# Patient Record
Sex: Female | Born: 1935
Health system: Southern US, Community
[De-identification: ages and names within clinical notes are randomized; demographics above are authoritative.]

## PROBLEM LIST (undated history)

## (undated) DIAGNOSIS — E669 Obesity, unspecified: Secondary | ICD-10-CM

## (undated) DIAGNOSIS — Z923 Personal history of irradiation: Secondary | ICD-10-CM

## (undated) DIAGNOSIS — C52 Malignant neoplasm of vagina: Secondary | ICD-10-CM

## (undated) DIAGNOSIS — I10 Essential (primary) hypertension: Secondary | ICD-10-CM

## (undated) DIAGNOSIS — D692 Other nonthrombocytopenic purpura: Secondary | ICD-10-CM

## (undated) DIAGNOSIS — S2220XA Unspecified fracture of sternum, initial encounter for closed fracture: Secondary | ICD-10-CM

## (undated) DIAGNOSIS — M1990 Unspecified osteoarthritis, unspecified site: Secondary | ICD-10-CM

## (undated) DIAGNOSIS — D126 Benign neoplasm of colon, unspecified: Secondary | ICD-10-CM

## (undated) HISTORY — DX: Unspecified osteoarthritis, unspecified site: M19.90

## (undated) HISTORY — DX: Other nonthrombocytopenic purpura: D69.2

## (undated) HISTORY — DX: Essential (primary) hypertension: I10

## (undated) HISTORY — PX: CATARACT EXTRACTION, BILATERAL: SHX1313

## (undated) HISTORY — PX: ABDOMINAL HYSTERECTOMY: SHX81

## (undated) HISTORY — DX: Obesity, unspecified: E66.9

## (undated) HISTORY — DX: Benign neoplasm of colon, unspecified: D12.6

---
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
## 1973-05-29 HISTORY — PX: TOTAL ABDOMINAL HYSTERECTOMY W/ BILATERAL SALPINGOOPHORECTOMY: SHX83

## 2010-05-29 DIAGNOSIS — D126 Benign neoplasm of colon, unspecified: Secondary | ICD-10-CM

## 2010-05-29 HISTORY — PX: COLONOSCOPY W/ BIOPSIES: SHX1374

## 2010-05-29 HISTORY — DX: Benign neoplasm of colon, unspecified: D12.6

## 2014-10-29 DIAGNOSIS — R739 Hyperglycemia, unspecified: Secondary | ICD-10-CM | POA: Diagnosis not present

## 2014-10-29 DIAGNOSIS — D72819 Decreased white blood cell count, unspecified: Secondary | ICD-10-CM | POA: Diagnosis not present

## 2014-10-29 DIAGNOSIS — I10 Essential (primary) hypertension: Secondary | ICD-10-CM | POA: Diagnosis not present

## 2014-11-11 DIAGNOSIS — H524 Presbyopia: Secondary | ICD-10-CM | POA: Diagnosis not present

## 2014-11-11 DIAGNOSIS — Z961 Presence of intraocular lens: Secondary | ICD-10-CM | POA: Diagnosis not present

## 2014-11-11 DIAGNOSIS — H04121 Dry eye syndrome of right lacrimal gland: Secondary | ICD-10-CM | POA: Diagnosis not present

## 2014-11-11 DIAGNOSIS — H04122 Dry eye syndrome of left lacrimal gland: Secondary | ICD-10-CM | POA: Diagnosis not present

## 2014-11-11 DIAGNOSIS — H04123 Dry eye syndrome of bilateral lacrimal glands: Secondary | ICD-10-CM | POA: Diagnosis not present

## 2015-03-16 DIAGNOSIS — Z23 Encounter for immunization: Secondary | ICD-10-CM | POA: Diagnosis not present

## 2015-04-06 DIAGNOSIS — M25561 Pain in right knee: Secondary | ICD-10-CM | POA: Diagnosis not present

## 2015-04-06 DIAGNOSIS — M25551 Pain in right hip: Secondary | ICD-10-CM | POA: Diagnosis not present

## 2015-04-27 DIAGNOSIS — M1711 Unilateral primary osteoarthritis, right knee: Secondary | ICD-10-CM | POA: Diagnosis not present

## 2015-04-30 DIAGNOSIS — M179 Osteoarthritis of knee, unspecified: Secondary | ICD-10-CM | POA: Diagnosis not present

## 2015-04-30 DIAGNOSIS — D126 Benign neoplasm of colon, unspecified: Secondary | ICD-10-CM | POA: Diagnosis not present

## 2015-04-30 DIAGNOSIS — Z6837 Body mass index (BMI) 37.0-37.9, adult: Secondary | ICD-10-CM | POA: Diagnosis not present

## 2015-04-30 DIAGNOSIS — Z1389 Encounter for screening for other disorder: Secondary | ICD-10-CM | POA: Diagnosis not present

## 2015-04-30 DIAGNOSIS — I10 Essential (primary) hypertension: Secondary | ICD-10-CM | POA: Diagnosis not present

## 2015-05-04 DIAGNOSIS — M1711 Unilateral primary osteoarthritis, right knee: Secondary | ICD-10-CM | POA: Diagnosis not present

## 2015-05-11 DIAGNOSIS — M1711 Unilateral primary osteoarthritis, right knee: Secondary | ICD-10-CM | POA: Diagnosis not present

## 2015-05-18 DIAGNOSIS — M1711 Unilateral primary osteoarthritis, right knee: Secondary | ICD-10-CM | POA: Diagnosis not present

## 2015-05-25 DIAGNOSIS — M1711 Unilateral primary osteoarthritis, right knee: Secondary | ICD-10-CM | POA: Diagnosis not present

## 2015-12-07 DIAGNOSIS — I10 Essential (primary) hypertension: Secondary | ICD-10-CM | POA: Diagnosis not present

## 2015-12-07 DIAGNOSIS — R8299 Other abnormal findings in urine: Secondary | ICD-10-CM | POA: Diagnosis not present

## 2015-12-07 DIAGNOSIS — N390 Urinary tract infection, site not specified: Secondary | ICD-10-CM | POA: Diagnosis not present

## 2015-12-14 DIAGNOSIS — D692 Other nonthrombocytopenic purpura: Secondary | ICD-10-CM | POA: Diagnosis not present

## 2015-12-14 DIAGNOSIS — I10 Essential (primary) hypertension: Secondary | ICD-10-CM | POA: Diagnosis not present

## 2015-12-14 DIAGNOSIS — D126 Benign neoplasm of colon, unspecified: Secondary | ICD-10-CM | POA: Diagnosis not present

## 2015-12-14 DIAGNOSIS — M179 Osteoarthritis of knee, unspecified: Secondary | ICD-10-CM | POA: Diagnosis not present

## 2015-12-14 DIAGNOSIS — Z0000 Encounter for general adult medical examination without abnormal findings: Secondary | ICD-10-CM | POA: Diagnosis not present

## 2015-12-14 DIAGNOSIS — Z1389 Encounter for screening for other disorder: Secondary | ICD-10-CM | POA: Diagnosis not present

## 2015-12-15 ENCOUNTER — Encounter: Payer: Self-pay | Admitting: Gastroenterology

## 2016-01-05 ENCOUNTER — Ambulatory Visit (INDEPENDENT_AMBULATORY_CARE_PROVIDER_SITE_OTHER): Payer: Medicare Other | Admitting: Gastroenterology

## 2016-01-05 ENCOUNTER — Encounter: Payer: Self-pay | Admitting: Gastroenterology

## 2016-01-05 VITALS — BP 138/86 | HR 80 | Ht 70.5 in | Wt 245.6 lb

## 2016-01-05 DIAGNOSIS — Z860100 Personal history of colon polyps, unspecified: Secondary | ICD-10-CM

## 2016-01-05 NOTE — Patient Instructions (Signed)
We will contact you once we receive the colonoscopy report from your previous GI doctor and let you know if you will need to schedule a colonoscopy.

## 2016-01-05 NOTE — Progress Notes (Signed)
HPI :  82 y/o very pleasant female here for a new patient evaluation. She has a history of obesity, HTN, and colon polyps. She is here to discuss a surveillance colonoscopy.   She has some chronic knee pains for which she sees PT but otherwise states she is very active   She reports a prior surgery to remove a large polyp in her colon about 10 years ago, she had colon resection but states she is unaware of the details of this. Her last colonoscopy was in 2012 with a reported adenoma removed although we don't have those records available today.   She denies any blood in the stools. She has some rare incontinence if she has a strong urge, but no routine diarrhea or constipation. She has roughly 3 BMs per day or so. She tries to eat very healthy. She has lost 30 lbs since changing her diet over the past year when she moved from [place] to [place]. No abdominal pains. She denies a history of anemia.    Labs: 12/07/15 Hgb 13.1, WBC 4.3, Plt 195 LAEs normal   Past Medical History:  Diagnosis Date  . Adenomatous colon polyp 2012  . Arthritis   . Hypertension   . Obesity   . Osteoarthritis   . Senile purpura ([place])      Past Surgical History:  Procedure Laterality Date  . COLONOSCOPY W/ BIOPSIES  2012  . TOTAL ABDOMINAL HYSTERECTOMY W/ BILATERAL SALPINGOOPHORECTOMY  1975   Family History  Problem Relation Age of Onset  . Gout Father   . Diabetes Sister   . Breast cancer Maternal Aunt   . Prostate cancer Son    Social History  Substance Use Topics  . Smoking status: Never Smoker  . Smokeless tobacco: Never Used  . Alcohol use No   Current Outpatient Prescriptions  Medication Sig Dispense Refill  . Calcium Carbonate (CALCIUM 600 PO) Take 1 tablet by mouth daily.    . Cholecalciferol (VITAMIN D3) 3000 units TABS Take 1 tablet by mouth daily.    . Cranberry-Vitamin C (AZO CRANBERRY URINARY TRACT) 250-60 MG CAPS Take 1 capsule by mouth 2 (two) times daily.    .  hydrochlorothiazide (HYDRODIURIL) 25 MG tablet Take 25 mg by mouth daily.    Marland Kitchen ibuprofen (ADVIL,MOTRIN) 800 MG tablet Take 800 mg by mouth daily as needed.    . Multiple Vitamins-Minerals (MULTIPLE VITAMINS/WOMENS PO) Take 1 capsule by mouth daily.    . Omega-3 Fatty Acids (FISH OIL) 1000 MG CAPS Take 2,000 capsules by mouth daily.     No current facility-administered medications for this visit.    No Known Allergies   Review of Systems: All systems reviewed and negative except where noted in HPI.    No results found.  Physical Exam: BP 138/86 (BP Location: Left Arm, Patient Position: Sitting, Cuff Size: Large)   Pulse 80   Ht 5' 10.5" (1.791 m)   Wt 245 lb 9.6 oz (111.4 kg)   BMI 34.74 kg/m  Constitutional: Pleasant,well-developed, female in no acute distress. HEENT: Normocephalic and atraumatic. Conjunctivae are normal. No scleral icterus. Neck supple.  Cardiovascular: Normal rate, regular rhythm.  Pulmonary/chest: Effort normal and breath sounds normal. No wheezing, rales or rhonchi. Abdominal: Soft, nondistended, nontender. Bowel sounds active throughout. There are no masses palpable.  Extremities: no edema Lymphadenopathy: No cervical adenopathy noted. Neurological: Alert and oriented to person place and time. Skin: Skin is warm and dry. No rashes noted. Psychiatric: Normal mood and affect. Behavior  is normal.   ASSESSMENT AND PLAN: 82 y/o female, quite healthy, with a reported history of colon resection for advanced colon polyp, with last surveillance colonoscopy 5 years ago, here to discuss surveillance colonoscopy. I don't have the report of her last colonoscopy so we will obtain this to clarify the findings. I discussed risks / benefits of further colon cancer screening with her at this time. Given her age, I don't think she would warrant a repeat surveillance exam at this time unless she had a high risk lesion noted on her last exam. She is healthy and quite active for  her age, and discussed this issue at length with her. I will await her colonoscopy report and get back to her once we have this. If there is no high risk lesion, I think it would be prudent to stop surveillance exams unless she strongly wishes to pursue it. She wishes to think about it for now.   Gu Oidak Cellar, MD [facility] Pager [phone]

## 2016-01-20 ENCOUNTER — Telehealth: Payer: Self-pay | Admitting: Gastroenterology

## 2016-01-20 NOTE — Telephone Encounter (Signed)
Records from prior colonoscopies obtained and outlined below: Done at [facility], Dr. Lauris Chroman  Colonoscopy 08/31/2010 - 2 x 26mm flat sigmoid hyperplastic polyps removed, sigmoid diverticulosis, hemorrhoids  Colonoscopy 08/28/2007 - 28mm IC valve polyp - adenoma, 1.1cm transverse polyp - hyperplastic Colonoscopy 08/25/2004 - diminutive sigmoid polyp, 26mm rectal polyp - both hyperplastic Colonoscopy 07/21/2003 - diminutive ascending polyp - normal mucosa on path, diminutive sigmoid polyp - hyperplastic  Overall, while the patient had a large polyp removed with surgical resection several years ago, she has had 4 colonoscopies since 2005 with only one small adenoma, the rest hyperplastic / benign polyps. Her last colonoscopy in 2012 showed 2 sigmoid hyperplastic polyps and no adenomas. Given her age and these results, I don't think any further surveillance colonoscopies are recommended at this time.   Can you please relay this to her? Thank you

## 2016-01-20 NOTE — Telephone Encounter (Signed)
Patient is advised.  

## 2016-02-10 DIAGNOSIS — M179 Osteoarthritis of knee, unspecified: Secondary | ICD-10-CM | POA: Diagnosis not present

## 2016-02-10 DIAGNOSIS — Z6836 Body mass index (BMI) 36.0-36.9, adult: Secondary | ICD-10-CM | POA: Diagnosis not present

## 2016-02-10 DIAGNOSIS — Z23 Encounter for immunization: Secondary | ICD-10-CM | POA: Diagnosis not present

## 2016-04-27 DIAGNOSIS — M545 Low back pain: Secondary | ICD-10-CM | POA: Diagnosis not present

## 2016-04-27 DIAGNOSIS — M1711 Unilateral primary osteoarthritis, right knee: Secondary | ICD-10-CM | POA: Diagnosis not present

## 2016-05-01 ENCOUNTER — Encounter (INDEPENDENT_AMBULATORY_CARE_PROVIDER_SITE_OTHER): Payer: Self-pay

## 2016-05-01 ENCOUNTER — Ambulatory Visit (INDEPENDENT_AMBULATORY_CARE_PROVIDER_SITE_OTHER): Payer: Medicare Other | Admitting: Cardiology

## 2016-05-01 ENCOUNTER — Encounter: Payer: Self-pay | Admitting: Cardiology

## 2016-05-01 VITALS — BP 182/94 | HR 79 | Ht 70.0 in | Wt 248.8 lb

## 2016-05-01 DIAGNOSIS — M179 Osteoarthritis of knee, unspecified: Secondary | ICD-10-CM | POA: Insufficient documentation

## 2016-05-01 DIAGNOSIS — Z01818 Encounter for other preprocedural examination: Secondary | ICD-10-CM | POA: Diagnosis not present

## 2016-05-01 DIAGNOSIS — M1711 Unilateral primary osteoarthritis, right knee: Secondary | ICD-10-CM

## 2016-05-01 DIAGNOSIS — I10 Essential (primary) hypertension: Secondary | ICD-10-CM | POA: Diagnosis not present

## 2016-05-01 DIAGNOSIS — M1710 Unilateral primary osteoarthritis, unspecified knee: Secondary | ICD-10-CM | POA: Insufficient documentation

## 2016-05-01 HISTORY — DX: Essential (primary) hypertension: I10

## 2016-05-01 NOTE — Progress Notes (Signed)
Cardiology Office Note    Date:  05/01/2016   ID:  Dublin Highsmith, DOB [birth year], MRN [id number]  PCP:  Haywood Pao, MD  Cardiologist:  Fransico Him, MD   Chief Complaint  Patient presents with  . New Evaluation    preoperative cardiac clearance    History of Present Illness:  Mia Simpson is a 82 y.o. female with a history of HTN presents today for evaluation for preoperative cardiac clearance prior to knee replacement surgery.  She has no history of heart disease.  She denies any chest pain or pressure.  She denies any SOB, DOE,  dizziness, palpitations or syncope.  She occasionally has some mild LE edema controlled with diuretics.  Her right knee swells occasionally.  She currently walks with a cane for the past month.  She has been very active until the last month and could walk at least 4 blocks.  She walks on the step machine for 1.5 miles twice weekly.      Past Medical History:  Diagnosis Date  . Adenomatous colon polyp 2012  . Arthritis   . Benign essential HTN 05/01/2016  . Hypertension   . Obesity   . Osteoarthritis   . Senile purpura ([place])     Past Surgical History:  Procedure Laterality Date  . COLONOSCOPY W/ BIOPSIES  2012  . TOTAL ABDOMINAL HYSTERECTOMY W/ BILATERAL SALPINGOOPHORECTOMY  1975    Current Medications: Outpatient Medications Prior to Visit  Medication Sig Dispense Refill  . Calcium Carbonate (CALCIUM 600 PO) Take 1 tablet by mouth daily.    . Cholecalciferol (VITAMIN D3) 3000 units TABS Take 1 tablet by mouth daily.    . Cranberry-Vitamin C (AZO CRANBERRY URINARY TRACT) 250-60 MG CAPS Take 1 capsule by mouth 2 (two) times daily.    . hydrochlorothiazide (HYDRODIURIL) 25 MG tablet Take 25 mg by mouth daily.    Marland Kitchen ibuprofen (ADVIL,MOTRIN) 800 MG tablet Take 800 mg by mouth daily as needed.    . Multiple Vitamins-Minerals (MULTIPLE VITAMINS/WOMENS PO) Take 1 capsule by mouth daily.    . Omega-3 Fatty Acids (FISH OIL) 1000 MG CAPS Take 2,000  capsules by mouth daily.     No facility-administered medications prior to visit.      Allergies:   Patient has no known allergies.   Social History   Social History  . Marital status: Single    Spouse name: N/A  . Number of children: 2  . Years of education: N/A   Occupational History  . retired    Social History Main Topics  . Smoking status: Never Smoker  . Smokeless tobacco: Never Used  . Alcohol use No  . Drug use: No  . Sexual activity: Not Asked   Other Topics Concern  . None   Social History Narrative  . None     Family History:  The patient's family history includes Breast cancer in her maternal aunt; Diabetes in her sister; Gout in her father; Prostate cancer in her son.   ROS:   Please see the history of present illness.    ROS All other systems reviewed and are negative.  No flowsheet data found.     PHYSICAL EXAM:   VS:  BP (!) 182/94   Pulse 79   Ht 5\' 10"  (1.778 m)   Wt 248 lb 12.8 oz (112.9 kg)   BMI 35.70 kg/m    GEN: Well nourished, well developed, in no acute distress  HEENT: normal  Neck: no JVD,  carotid bruits, or masses Cardiac: RRR; no murmurs, rubs, or gallops,no edema.  Intact distal pulses bilaterally.  Respiratory:  clear to auscultation bilaterally, normal work of breathing GI: soft, nontender, nondistended, + BS MS: no deformity or atrophy  Skin: warm and dry, no rash Neuro:  Alert and Oriented x 3, Strength and sensation are intact Psych: euthymic mood, full affect  Wt Readings from Last 3 Encounters:  05/01/16 248 lb 12.8 oz (112.9 kg)  01/05/16 245 lb 9.6 oz (111.4 kg)      Studies/Labs Reviewed:   EKG:  EKG is ordered today.  The ekg ordered today demonstrates NSR at 79bpm with PAC and aberrantly conducted QRS  Recent Labs: No results found for requested labs within last 8760 hours.   Lipid Panel No results found for: CHOL, TRIG, HDL, CHOLHDL, VLDL, LDLCALC, LDLDIRECT  Additional studies/ records that were  reviewed today include:  none    ASSESSMENT:    1. Preoperative clearance   2. Benign essential HTN   3. Primary osteoarthritis of right knee      PLAN:  In order of problems listed above:  1. HTN - BP elevated today but usually her BP runs 120-130/47mmHg.  She has been on a steroid taper for the past 8 days which I suspect is playing a role in her elevated BP.  Continue diuretic.   2. Preoperative cardiac clearance.  She has no history of CAD and EKG is nonischemic.  Her only risk factors for CAD are HTN, obesity and post menopausal state.  Prior to about a month ago she was active walking at least 4 blocks which is equivalent to 4 mets. She is still doing a walking class 2 times weekly for 1.5 miles each day and has no cardiac complaints.  I think she is low risk from a cardiac standpoint and should proceed with knee surgery.   3. DJD of the right knee    Medication Adjustments/Labs and Tests Ordered: Current medicines are reviewed at length with the patient today.  Concerns regarding medicines are outlined above.  Medication changes, Labs and Tests ordered today are listed in the Patient Instructions below.  There are no Patient Instructions on file for this visit.   Signed, Fransico Him, MD  05/01/2016 8:34 AM    [address], [place], [place]  [address] Phone: [phone]; Fax: [phone]

## 2016-05-01 NOTE — Patient Instructions (Signed)
Medication Instructions:  Your physician recommends that you continue on your current medications as directed. Please refer to the Current Medication list given to you today.   Labwork: None  Testing/Procedures: None  Follow-Up: Your physician recommends that you schedule a follow-up appointment AS NEEDED with Dr. Turner.  Any Other Special Instructions Will Be Listed Below (If Applicable).     If you need a refill on your cardiac medications before your next appointment, please call your pharmacy.   

## 2016-05-02 NOTE — Addendum Note (Signed)
Addended by: Zebedee Iba on: 05/02/2016 08:21 AM   Modules accepted: Orders

## 2016-05-04 DIAGNOSIS — M545 Low back pain: Secondary | ICD-10-CM | POA: Diagnosis not present

## 2016-05-23 ENCOUNTER — Ambulatory Visit: Payer: Self-pay | Admitting: Physician Assistant

## 2016-05-23 DIAGNOSIS — M25561 Pain in right knee: Secondary | ICD-10-CM | POA: Diagnosis not present

## 2016-05-23 NOTE — H&P (Signed)
TOTAL KNEE ADMISSION H&P  Patient is being admitted for right total knee arthroplasty.  Subjective:  Chief Complaint:right knee pain.  HPI: Mia Simpson, 82 y.o. female, has a history of pain and functional disability in the right knee due to arthritis and has failed non-surgical conservative treatments for greater than 12 weeks to includeNSAID's and/or analgesics, corticosteriod injections, viscosupplementation injections, use of assistive devices and activity modification.  Onset of symptoms was gradual, starting >10 years ago with gradually worsening course since that time. The patient noted no past surgery on the right knee(s).  Patient currently rates pain in the right knee(s) at 10 out of 10 with activity. Patient has night pain, worsening of pain with activity and weight bearing, pain that interferes with activities of daily living, pain with passive range of motion, crepitus and joint swelling.  Patient has evidence of periarticular osteophytes and joint space narrowing by imaging studies.  There is no active infection.  Patient Active Problem List   Diagnosis Date Noted  . Benign essential HTN 05/01/2016  . Preoperative clearance 05/01/2016  . DJD (degenerative joint disease) of knee 05/01/2016   Past Medical History:  Diagnosis Date  . Adenomatous colon polyp 2012  . Arthritis   . Benign essential HTN 05/01/2016  . Hypertension   . Obesity   . Osteoarthritis   . Senile purpura ([place])     Past Surgical History:  Procedure Laterality Date  . COLONOSCOPY W/ BIOPSIES  2012  . TOTAL ABDOMINAL HYSTERECTOMY W/ BILATERAL SALPINGOOPHORECTOMY  1975     (Not in a hospital admission) No Known Allergies  Social History  Substance Use Topics  . Smoking status: Never Smoker  . Smokeless tobacco: Never Used  . Alcohol use No    Family History  Problem Relation Age of Onset  . Gout Father   . Diabetes Sister   . Breast cancer Maternal Aunt   . Prostate cancer Son       ROS  Objective:  Physical Exam  Vital signs in last 24 hours: @VSRANGES @  Labs:   Estimated body mass index is 35.7 kg/m as calculated from the following:   Height as of 05/01/16: 5\' 10"  (1.778 m).   Weight as of 05/01/16: 112.9 kg (248 lb 12.8 oz).   Imaging Review Plain radiographs demonstrate severe degenerative joint disease of the right knee(s). The overall alignment ismild varus. The bone quality appears to be good for age and reported activity level.  Assessment/Plan:  End stage arthritis, right knee   The patient history, physical examination, clinical judgment of the provider and imaging studies are consistent with end stage degenerative joint disease of the right knee(s) and total knee arthroplasty is deemed medically necessary. The treatment options including medical management, injection therapy arthroscopy and arthroplasty were discussed at length. The risks and benefits of total knee arthroplasty were presented and reviewed. The risks due to aseptic loosening, infection, stiffness, patella tracking problems, thromboembolic complications and other imponderables were discussed. The patient acknowledged the explanation, agreed to proceed with the plan and consent was signed. Patient is being admitted for inpatient treatment for surgery, pain control, PT, OT, prophylactic antibiotics, VTE prophylaxis, progressive ambulation and ADL's and discharge planning. The patient is planning to be discharged to skilled nursing facility [facility]

## 2016-05-24 DIAGNOSIS — M48061 Spinal stenosis, lumbar region without neurogenic claudication: Secondary | ICD-10-CM | POA: Diagnosis not present

## 2016-05-24 DIAGNOSIS — M5136 Other intervertebral disc degeneration, lumbar region: Secondary | ICD-10-CM | POA: Diagnosis not present

## 2016-05-24 DIAGNOSIS — M5116 Intervertebral disc disorders with radiculopathy, lumbar region: Secondary | ICD-10-CM | POA: Diagnosis not present

## 2016-05-24 DIAGNOSIS — M545 Low back pain: Secondary | ICD-10-CM | POA: Diagnosis not present

## 2016-06-02 NOTE — Pre-Procedure Instructions (Signed)
Mia Simpson  06/02/2016      [id number] [address], [place] [phone] [facility] [place] [address] [place] [address] Phone: [phone] Fax: [phone]    Your procedure is scheduled on January 19  Report to [facility] at [place].M.  Call this number if you have problems the morning of surgery:  [phone]   Remember:  Do not eat food or drink liquids after midnight.   Take these medicines the morning of surgery with A SIP OF WATER NONE  7 days prior to surgery STOP taking any Aspirin, Aleve, Naproxen, Ibuprofen, Motrin, Advil, Goody's, BC's, all herbal medications, fish oil, and all vitamins    Do not wear jewelry, make-up or nail polish.  Do not wear lotions, powders, or perfumes, or deoderant.  Do not shave 48 hours prior to surgery.    Do not bring valuables to the hospital.  [facility] is not responsible for any belongings or valuables.  Contacts, dentures or bridgework may not be worn into surgery.  Leave your suitcase in the car.  After surgery it may be brought to your room.  For patients admitted to the hospital, discharge time will be determined by your treatment team.  Patients discharged the day of surgery will not be allowed to drive home.    Special instructions:   [facility]- Preparing For Surgery  Before surgery, you can play an important role. Because skin is not sterile, your skin needs to be as free of germs as possible. You can reduce the number of germs on your skin by washing with CHG (chlorahexidine gluconate) Soap before surgery.  CHG is an antiseptic cleaner which kills germs and bonds with the skin to continue killing germs even after washing.  Please do not use if you have an allergy to CHG or antibacterial soaps. If your skin becomes reddened/irritated stop using the CHG.  Do not shave (including legs and underarms) for at least 48 hours prior to first CHG shower. It is OK to  shave your face.  Please follow these instructions carefully.   1. Shower the NIGHT BEFORE SURGERY and the MORNING OF SURGERY with CHG.   2. If you chose to wash your hair, wash your hair first as usual with your normal shampoo.  3. After you shampoo, rinse your hair and body thoroughly to remove the shampoo.  4. Use CHG as you would any other liquid soap. You can apply CHG directly to the skin and wash gently with a scrungie or a clean washcloth.   5. Apply the CHG Soap to your body ONLY FROM THE NECK DOWN.  Do not use on open wounds or open sores. Avoid contact with your eyes, ears, mouth and genitals (private parts). Wash genitals (private parts) with your normal soap.  6. Wash thoroughly, paying special attention to the area where your surgery will be performed.  7. Thoroughly rinse your body with warm water from the neck down.  8. DO NOT shower/wash with your normal soap after using and rinsing off the CHG Soap.  9. Pat yourself dry with a CLEAN TOWEL.   10. Wear CLEAN PAJAMAS   11. Place CLEAN SHEETS on your bed the night of your first shower and DO NOT SLEEP WITH PETS.    Day of Surgery: Do not apply any deodorants/lotions. Please wear clean clothes to the hospital/surgery center.      Please read over the  following fact sheets that you were given.

## 2016-06-05 ENCOUNTER — Encounter (HOSPITAL_COMMUNITY): Payer: Self-pay

## 2016-06-05 ENCOUNTER — Encounter (HOSPITAL_COMMUNITY)
Admission: RE | Admit: 2016-06-05 | Discharge: 2016-06-05 | Disposition: A | Discharge status: Home / Self Care | Payer: Medicare Other | Source: Ambulatory Visit | Attending: Orthopedic Surgery | Admitting: Orthopedic Surgery

## 2016-06-05 ENCOUNTER — Encounter (HOSPITAL_COMMUNITY)
Admission: RE | Admit: 2016-06-05 | Discharge: 2016-06-05 | Disposition: A | Discharge status: Home / Self Care | Payer: Medicare Other | Source: Ambulatory Visit | Attending: Physician Assistant | Admitting: Physician Assistant

## 2016-06-05 DIAGNOSIS — M545 Low back pain: Secondary | ICD-10-CM | POA: Diagnosis not present

## 2016-06-05 DIAGNOSIS — Z01818 Encounter for other preprocedural examination: Secondary | ICD-10-CM | POA: Diagnosis not present

## 2016-06-05 DIAGNOSIS — Z96698 Presence of other orthopedic joint implants: Secondary | ICD-10-CM | POA: Diagnosis not present

## 2016-06-05 DIAGNOSIS — M1711 Unilateral primary osteoarthritis, right knee: Secondary | ICD-10-CM | POA: Insufficient documentation

## 2016-06-05 DIAGNOSIS — M48061 Spinal stenosis, lumbar region without neurogenic claudication: Secondary | ICD-10-CM | POA: Diagnosis not present

## 2016-06-05 DIAGNOSIS — M5136 Other intervertebral disc degeneration, lumbar region: Secondary | ICD-10-CM | POA: Diagnosis not present

## 2016-06-05 DIAGNOSIS — Z01812 Encounter for preprocedural laboratory examination: Secondary | ICD-10-CM | POA: Insufficient documentation

## 2016-06-05 DIAGNOSIS — M5116 Intervertebral disc disorders with radiculopathy, lumbar region: Secondary | ICD-10-CM | POA: Diagnosis not present

## 2016-06-05 LAB — URINALYSIS, ROUTINE W REFLEX MICROSCOPIC
Bacteria, UA: NONE SEEN
Bilirubin Urine: NEGATIVE
GLUCOSE, UA: NEGATIVE mg/dL
HGB URINE DIPSTICK: NEGATIVE
Ketones, ur: NEGATIVE mg/dL
NITRITE: NEGATIVE
PROTEIN: NEGATIVE mg/dL
Specific Gravity, Urine: 1.016 (ref 1.005–1.030)
pH: 7 (ref 5.0–8.0)

## 2016-06-05 LAB — COMPREHENSIVE METABOLIC PANEL
ALBUMIN: 4.3 g/dL (ref 3.5–5.0)
ALT: 20 U/L (ref 14–54)
AST: 28 U/L (ref 15–41)
Alkaline Phosphatase: 56 U/L (ref 38–126)
Anion gap: 10 (ref 5–15)
BILIRUBIN TOTAL: 0.4 mg/dL (ref 0.3–1.2)
BUN: 18 mg/dL (ref 6–20)
CALCIUM: 9.5 mg/dL (ref 8.9–10.3)
CO2: 26 mmol/L (ref 22–32)
Chloride: 107 mmol/L (ref 101–111)
Creatinine, Ser: 0.86 mg/dL (ref 0.44–1.00)
GFR calc Af Amer: >60 mL/min (ref >60)
GFR calc non Af Amer: 59 mL/min — ABNORMAL LOW (ref >60)
GLUCOSE: 116 mg/dL — AB (ref 65–99)
POTASSIUM: 4.1 mmol/L (ref 3.5–5.1)
Sodium: 143 mmol/L (ref 135–145)
TOTAL PROTEIN: 7.8 g/dL (ref 6.5–8.1)

## 2016-06-05 LAB — CBC WITH DIFFERENTIAL/PLATELET
BASOS PCT: 1 %
Basophils Absolute: 0 10*3/uL (ref 0.0–0.1)
Eosinophils Absolute: 0 10*3/uL (ref 0.0–0.7)
Eosinophils Relative: 1 %
HEMATOCRIT: 42.8 % (ref 36.0–46.0)
HEMOGLOBIN: 14 g/dL (ref 12.0–15.0)
Lymphocytes Relative: 36 %
Lymphs Abs: 1.8 10*3/uL (ref 0.7–4.0)
MCH: 30 pg (ref 26.0–34.0)
MCHC: 32.7 g/dL (ref 30.0–36.0)
MCV: 91.8 fL (ref 78.0–100.0)
Monocytes Absolute: 0.3 10*3/uL (ref 0.1–1.0)
Monocytes Relative: 6 %
NEUTROS ABS: 2.8 10*3/uL (ref 1.7–7.7)
NEUTROS PCT: 56 %
Platelets: 242 10*3/uL (ref 150–400)
RBC: 4.66 MIL/uL (ref 3.87–5.11)
RDW: 15.5 % (ref 11.5–15.5)
WBC: 4.9 10*3/uL (ref 4.0–10.5)

## 2016-06-05 LAB — TYPE AND SCREEN
ABO/RH(D): A POS
ANTIBODY SCREEN: NEGATIVE

## 2016-06-05 LAB — PROTIME-INR
INR: 1.14
Prothrombin Time: 14.7 seconds (ref 11.4–15.2)

## 2016-06-05 LAB — ABO/RH: ABO/RH(D): A POS

## 2016-06-05 LAB — SURGICAL PCR SCREEN
MRSA, PCR: NEGATIVE
Staphylococcus aureus: NEGATIVE

## 2016-06-05 LAB — APTT: APTT: 33 s (ref 24–36)

## 2016-06-06 LAB — URINE CULTURE

## 2016-06-13 DIAGNOSIS — M179 Osteoarthritis of knee, unspecified: Secondary | ICD-10-CM | POA: Diagnosis not present

## 2016-06-13 DIAGNOSIS — I10 Essential (primary) hypertension: Secondary | ICD-10-CM | POA: Diagnosis not present

## 2016-06-13 DIAGNOSIS — M545 Low back pain: Secondary | ICD-10-CM | POA: Diagnosis not present

## 2016-06-13 DIAGNOSIS — R2689 Other abnormalities of gait and mobility: Secondary | ICD-10-CM | POA: Diagnosis not present

## 2016-06-13 DIAGNOSIS — Z6836 Body mass index (BMI) 36.0-36.9, adult: Secondary | ICD-10-CM | POA: Diagnosis not present

## 2016-06-13 DIAGNOSIS — D126 Benign neoplasm of colon, unspecified: Secondary | ICD-10-CM | POA: Diagnosis not present

## 2016-06-13 DIAGNOSIS — D692 Other nonthrombocytopenic purpura: Secondary | ICD-10-CM | POA: Diagnosis not present

## 2016-06-15 MED ORDER — TRANEXAMIC ACID 1000 MG/10ML IV SOLN
1000.0000 mg | INTRAVENOUS | Status: AC
  Administered 2016-06-16: 1000 mg via INTRAVENOUS
  Filled 2016-06-15: qty 10

## 2016-06-15 NOTE — Anesthesia Preprocedure Evaluation (Addendum)
Anesthesia Evaluation  Patient identified by MRN, date of birth, ID band Patient awake    Reviewed: Allergy & Precautions, NPO status , Patient's Chart, lab work & pertinent test results  History of Anesthesia Complications Negative for: history of anesthetic complications  Airway Mallampati: I  TM Distance: >3 FB Neck ROM: Full    Dental no notable dental hx. (+) Partial Upper, Dental Advisory Given   Pulmonary neg pulmonary ROS,    Pulmonary exam normal breath sounds clear to auscultation       Cardiovascular hypertension, Pt. on medications negative cardio ROS Normal cardiovascular exam Rhythm:Regular Rate:Normal     Neuro/Psych negative neurological ROS  negative psych ROS   GI/Hepatic negative GI ROS, Neg liver ROS,   Endo/Other  Morbid obesity  Renal/GU negative Renal ROS     Musculoskeletal negative musculoskeletal ROS (+)   Abdominal   Peds  Hematology negative hematology ROS (+)   Anesthesia Other Findings Day of surgery medications reviewed with the patient.  Reproductive/Obstetrics                          Anesthesia Physical Anesthesia Plan  ASA: III  Anesthesia Plan: MAC and Spinal   Post-op Pain Management:  Regional for Post-op pain   Induction:   Airway Management Planned: Simple Face Mask  Additional Equipment:   Intra-op Plan:   Post-operative Plan:   Informed Consent: I have reviewed the patients History and Physical, chart, labs and discussed the procedure including the risks, benefits and alternatives for the proposed anesthesia with the patient or authorized representative who has indicated his/her understanding and acceptance.   Dental advisory given  Plan Discussed with: CRNA, Anesthesiologist and Surgeon  Anesthesia Plan Comments:        Anesthesia Quick Evaluation

## 2016-06-15 NOTE — H&P (Signed)
ORTHOPAEDIC HISTORY & PHYSICAL  Mia Simpson MRN:  [id number] DOB/SEX:  03-Apr-1928/female  CHIEF COMPLAINT:  Painful right knee  HISTORY: Patient is a 83 y.o. female presented with a history of pain in the right knee for 1 years. Onset of symptoms was gradual starting several months ago with gradually worsening course since that time. Prior procedures on the knee are none. Patient has been treated conservatively with over-the-counter NSAIDs and activity modification. Patient currently rates pain at 7 out of 10 with activity. There is pain at night. present.  They have been previously treated with: NSAIDS: NSAID, Steriods, Snyvisc with mild improvement    PAST MEDICAL HISTORY: Patient Active Problem List   Diagnosis Date Noted  . Benign essential HTN 05/01/2016  . Preoperative clearance 05/01/2016  . DJD (degenerative joint disease) of knee 05/01/2016   Past Medical History:  Diagnosis Date  . Adenomatous colon polyp 2012  . Arthritis   . Benign essential HTN 05/01/2016  . Hypertension   . Obesity   . Osteoarthritis   . Senile purpura ([place])    Past Surgical History:  Procedure Laterality Date  . CATARACT EXTRACTION, BILATERAL    . COLONOSCOPY W/ BIOPSIES  2012  . TOTAL ABDOMINAL HYSTERECTOMY W/ BILATERAL SALPINGOOPHORECTOMY  1975     MEDICATIONS:   No prescriptions prior to admission.    ALLERGIES:  No Known Allergies  REVIEW OF SYSTEMS:  A comprehensive review of systems was negative.   FAMILY HISTORY:   Family History  Problem Relation Age of Onset  . Gout Father   . Diabetes Sister   . Breast cancer Maternal Aunt   . Prostate cancer Son     SOCIAL HISTORY:   Social History  Substance Use Topics  . Smoking status: Never Smoker  . Smokeless tobacco: Never Used  . Alcohol use No      EXAMINATION: Vital signs in last 24 hours:    Head is normocephalic.   Eyes:  Pupils equal, round and reactive to light and accommodation.  Extraocular intact. ENT:  Ears, nose, and throat were benign.   Neck: supple, no bruits were noted.   Chest: good expansion.   Lungs: essentially clear.   Cardiac: regular rhythm and rate, normal S1, S2.  No murmurs appreciated. Pulses :  2+ bilateral and symmetric in lower extremities. Abdomen is scaphoid, soft, nontender, no masses palpable, normal bowel sounds                  present. CNS:  He is oriented x3 and cranial nerves II-XII grossly intact. Breast, rectal, and genital exams: not performed and not indicated for an orthopedic evaluation. Musculoskeletal:   There were no vitals taken for this visit.  General Appearance:    Alert, cooperative, no distress, appears stated age  Head:    Normocephalic, without obvious abnormality, atraumatic  Eyes:    PERRL, conjunctiva/corneas clear, EOM's intact, fundi    benign, both eyes  Ears:    Normal TM's and external ear canals, both ears  Nose:   Nares normal, septum midline, mucosa normal, no drainage    or sinus tenderness  Throat:   Lips, mucosa, and tongue normal; teeth and gums normal  Neck:   Supple, symmetrical, trachea midline, no adenopathy;    thyroid:  no enlargement/tenderness/nodules; no carotid   bruit or JVD  Back:     Symmetric, no curvature, ROM normal, no CVA tenderness  Lungs:     Clear to auscultation bilaterally, respirations unlabored  Chest Wall:    No tenderness or deformity   Heart:    Regular rate and rhythm, S1 and S2 normal, no murmur, rub   or gallop  Breast Exam:    No tenderness, masses, or nipple abnormality  Abdomen:     Soft, non-tender, bowel sounds active all four quadrants,    no masses, no organomegaly  Genitalia:    Normal female without lesion, discharge or tenderness  Rectal:    Normal tone, normal prostate, no masses or tenderness;   guaiac negative stool  Extremities:   Extremities normal, atraumatic, no cyanosis or edema  Pulses:   2+ and symmetric all extremities  Skin:   Skin color, texture, turgor normal, no  rashes or lesions  Lymph nodes:   Cervical, supraclavicular, and axillary nodes normal  Neurologic:   CNII-XII intact, normal strength, sensation and reflexes    throughout    Musculoskeletal:  ROM 0-100, Ligaments stable,no effusion positive crepitus with joint line tenderness    Imaging Review Previous x-rays of the knee taken on April 27, 2016 showing tricompartmental significant OA with bone on bone medial compartment.  Large osteophytes ASSESSMENT: Primary localized osteoarthritis of the right knee  Past Medical History:  Diagnosis Date  . Adenomatous colon polyp 2012  . Arthritis   . Benign essential HTN 05/01/2016  . Hypertension   . Obesity   . Osteoarthritis   . Senile purpura ([place])     PLAN: Plan for right total knee arthoplasty   She is a candidate for Tranexamic acid perioperatively.  Ancef for antibiotic.  Eliquis for DVT prophylaxis.  Likely will try Norco plus or minus Tramadol postoperatively for pain control.  Plan on discharge to skilled nursing, [facility].  The procedure,  risks, and benefits of total knee arthroplasty were presented and reviewed. The risks including but not limited to infection, blood clots, vascular and nerve injury, stiffness,  among others were discussed. The patient acknowledged the explanation, agreed to proceed. She has obtained cardiac clearance from Dr. Fransico Him and primary clearance from Dr. Domenick Gong  BLAIR ROBERTS 06/15/2016, 4:39 PM

## 2016-06-16 ENCOUNTER — Encounter (HOSPITAL_COMMUNITY): Payer: Self-pay | Admitting: Anesthesiology

## 2016-06-16 ENCOUNTER — Encounter (HOSPITAL_COMMUNITY)
Admission: RE | Disposition: A | Discharge status: Skilled Nursing Facility | Payer: Self-pay | Source: Ambulatory Visit | Attending: Orthopedic Surgery

## 2016-06-16 ENCOUNTER — Inpatient Hospital Stay (HOSPITAL_COMMUNITY): Payer: Medicare Other | Admitting: Anesthesiology

## 2016-06-16 ENCOUNTER — Inpatient Hospital Stay (HOSPITAL_COMMUNITY): Payer: Medicare Other | Admitting: Emergency Medicine

## 2016-06-16 ENCOUNTER — Inpatient Hospital Stay (HOSPITAL_COMMUNITY)
Admission: RE | Admit: 2016-06-16 | Discharge: 2016-06-20 | DRG: 470 | Disposition: A | Discharge status: Skilled Nursing Facility | Payer: Medicare Other | Source: Ambulatory Visit | Attending: Orthopedic Surgery | Admitting: Orthopedic Surgery

## 2016-06-16 DIAGNOSIS — E669 Obesity, unspecified: Secondary | ICD-10-CM | POA: Diagnosis present

## 2016-06-16 DIAGNOSIS — Z833 Family history of diabetes mellitus: Secondary | ICD-10-CM | POA: Diagnosis not present

## 2016-06-16 DIAGNOSIS — R262 Difficulty in walking, not elsewhere classified: Secondary | ICD-10-CM

## 2016-06-16 DIAGNOSIS — D126 Benign neoplasm of colon, unspecified: Secondary | ICD-10-CM | POA: Diagnosis not present

## 2016-06-16 DIAGNOSIS — Z86010 Personal history of colonic polyps: Secondary | ICD-10-CM | POA: Diagnosis not present

## 2016-06-16 DIAGNOSIS — Z803 Family history of malignant neoplasm of breast: Secondary | ICD-10-CM

## 2016-06-16 DIAGNOSIS — Z90722 Acquired absence of ovaries, bilateral: Secondary | ICD-10-CM | POA: Diagnosis not present

## 2016-06-16 DIAGNOSIS — Z8042 Family history of malignant neoplasm of prostate: Secondary | ICD-10-CM

## 2016-06-16 DIAGNOSIS — M6281 Muscle weakness (generalized): Secondary | ICD-10-CM | POA: Diagnosis not present

## 2016-06-16 DIAGNOSIS — D692 Other nonthrombocytopenic purpura: Secondary | ICD-10-CM | POA: Diagnosis not present

## 2016-06-16 DIAGNOSIS — Z9841 Cataract extraction status, right eye: Secondary | ICD-10-CM

## 2016-06-16 DIAGNOSIS — Z96651 Presence of right artificial knee joint: Secondary | ICD-10-CM | POA: Diagnosis not present

## 2016-06-16 DIAGNOSIS — Z471 Aftercare following joint replacement surgery: Secondary | ICD-10-CM | POA: Diagnosis not present

## 2016-06-16 DIAGNOSIS — M25661 Stiffness of right knee, not elsewhere classified: Secondary | ICD-10-CM

## 2016-06-16 DIAGNOSIS — I10 Essential (primary) hypertension: Secondary | ICD-10-CM | POA: Diagnosis not present

## 2016-06-16 DIAGNOSIS — G8918 Other acute postprocedural pain: Secondary | ICD-10-CM | POA: Diagnosis not present

## 2016-06-16 DIAGNOSIS — Z9842 Cataract extraction status, left eye: Secondary | ICD-10-CM

## 2016-06-16 DIAGNOSIS — N390 Urinary tract infection, site not specified: Secondary | ICD-10-CM | POA: Diagnosis not present

## 2016-06-16 DIAGNOSIS — M25569 Pain in unspecified knee: Secondary | ICD-10-CM | POA: Diagnosis not present

## 2016-06-16 DIAGNOSIS — R531 Weakness: Secondary | ICD-10-CM | POA: Diagnosis not present

## 2016-06-16 DIAGNOSIS — Z90710 Acquired absence of both cervix and uterus: Secondary | ICD-10-CM | POA: Diagnosis not present

## 2016-06-16 DIAGNOSIS — M1711 Unilateral primary osteoarthritis, right knee: Secondary | ICD-10-CM | POA: Diagnosis not present

## 2016-06-16 HISTORY — PX: TOTAL KNEE ARTHROPLASTY: SHX125

## 2016-06-16 SURGERY — ARTHROPLASTY, KNEE, TOTAL
Anesthesia: Monitor Anesthesia Care | Laterality: Right

## 2016-06-16 MED ORDER — HYDROMORPHONE HCL 1 MG/ML IJ SOLN: 0.2500 mg | INTRAMUSCULAR | Status: DC | PRN

## 2016-06-16 MED ORDER — PROPOFOL 500 MG/50ML IV EMUL
INTRAVENOUS | Status: DC | PRN
  Administered 2016-06-16: 50 ug/kg/min via INTRAVENOUS

## 2016-06-16 MED ORDER — ALUM & MAG HYDROXIDE-SIMETH 200-200-20 MG/5ML PO SUSP: 30.0000 mL | ORAL | Status: DC | PRN

## 2016-06-16 MED ORDER — CHLORHEXIDINE GLUCONATE 4 % EX LIQD: 60.0000 mL | Freq: Once | CUTANEOUS | Status: DC

## 2016-06-16 MED ORDER — CEFAZOLIN SODIUM-DEXTROSE 2-4 GM/100ML-% IV SOLN
2.0000 g | INTRAVENOUS | Status: AC
  Administered 2016-06-16: 2 g via INTRAVENOUS
  Filled 2016-06-16: qty 100

## 2016-06-16 MED ORDER — PROPOFOL 10 MG/ML IV BOLUS
INTRAVENOUS | Status: AC
  Filled 2016-06-16: qty 20

## 2016-06-16 MED ORDER — PROMETHAZINE HCL 25 MG/ML IJ SOLN: 6.2500 mg | INTRAMUSCULAR | Status: DC | PRN

## 2016-06-16 MED ORDER — ACETAMINOPHEN 325 MG PO TABS: 650.0000 mg | ORAL_TABLET | Freq: Four times a day (QID) | ORAL | Status: DC | PRN

## 2016-06-16 MED ORDER — BUPIVACAINE-EPINEPHRINE (PF) 0.5% -1:200000 IJ SOLN
INTRAMUSCULAR | Status: AC
  Filled 2016-06-16: qty 30

## 2016-06-16 MED ORDER — PHENYLEPHRINE 40 MCG/ML (10ML) SYRINGE FOR IV PUSH (FOR BLOOD PRESSURE SUPPORT)
PREFILLED_SYRINGE | INTRAVENOUS | Status: AC
  Filled 2016-06-16: qty 10

## 2016-06-16 MED ORDER — MENTHOL 3 MG MT LOZG
1.0000 | LOZENGE | OROMUCOSAL | Status: DC | PRN
  Filled 2016-06-16: qty 9

## 2016-06-16 MED ORDER — MIDAZOLAM HCL 2 MG/2ML IJ SOLN
INTRAMUSCULAR | Status: AC
  Administered 2016-06-16: 1 mg
  Filled 2016-06-16: qty 2

## 2016-06-16 MED ORDER — BUPIVACAINE-EPINEPHRINE 0.5% -1:200000 IJ SOLN
INTRAMUSCULAR | Status: DC | PRN
  Administered 2016-06-16: 15 mL

## 2016-06-16 MED ORDER — FENTANYL CITRATE (PF) 100 MCG/2ML IJ SOLN
INTRAMUSCULAR | Status: DC | PRN
Start: 2016-06-16 — End: 2016-06-16
  Administered 2016-06-16: 50 ug via INTRAVENOUS

## 2016-06-16 MED ORDER — TRANEXAMIC ACID 1000 MG/10ML IV SOLN
2000.0000 mg | Freq: Once | INTRAVENOUS | Status: AC
  Administered 2016-06-16: 2000 mg via TOPICAL
  Filled 2016-06-16: qty 20

## 2016-06-16 MED ORDER — PHENYLEPHRINE HCL 10 MG/ML IJ SOLN
INTRAVENOUS | Status: DC | PRN
  Administered 2016-06-16: 25 ug/min via INTRAVENOUS

## 2016-06-16 MED ORDER — POLYETHYLENE GLYCOL 3350 17 G PO PACK
17.0000 g | PACK | Freq: Every day | ORAL | Status: DC | PRN
  Administered 2016-06-18: 17 g via ORAL
  Filled 2016-06-16: qty 1

## 2016-06-16 MED ORDER — BISACODYL 10 MG RE SUPP: 10.0000 mg | Freq: Every day | RECTAL | Status: DC | PRN

## 2016-06-16 MED ORDER — DEXAMETHASONE SODIUM PHOSPHATE 10 MG/ML IJ SOLN
INTRAMUSCULAR | Status: AC
  Filled 2016-06-16: qty 1

## 2016-06-16 MED ORDER — CEFAZOLIN SODIUM-DEXTROSE 2-4 GM/100ML-% IV SOLN
2.0000 g | Freq: Four times a day (QID) | INTRAVENOUS | Status: AC
  Administered 2016-06-16 – 2016-06-17 (×2): 2 g via INTRAVENOUS
  Filled 2016-06-16 (×2): qty 100

## 2016-06-16 MED ORDER — BUPIVACAINE LIPOSOME 1.3 % IJ SUSP
20.0000 mL | INTRAMUSCULAR | Status: AC
  Administered 2016-06-16: 20 mL
  Filled 2016-06-16: qty 20

## 2016-06-16 MED ORDER — FENTANYL CITRATE (PF) 100 MCG/2ML IJ SOLN
INTRAMUSCULAR | Status: AC
  Administered 2016-06-16: 100 ug
  Filled 2016-06-16: qty 2

## 2016-06-16 MED ORDER — ROPIVACAINE HCL 7.5 MG/ML IJ SOLN
INTRAMUSCULAR | Status: DC | PRN
  Administered 2016-06-16: 20 mL via PERINEURAL

## 2016-06-16 MED ORDER — ONDANSETRON HCL 4 MG/2ML IJ SOLN
INTRAMUSCULAR | Status: DC | PRN
  Administered 2016-06-16: 4 mg via INTRAVENOUS

## 2016-06-16 MED ORDER — HYDROMORPHONE HCL 2 MG/ML IJ SOLN
0.5000 mg | INTRAMUSCULAR | Status: DC | PRN
  Filled 2016-06-16: qty 1

## 2016-06-16 MED ORDER — LACTATED RINGERS IV SOLN
INTRAVENOUS | Status: DC | PRN
  Administered 2016-06-16 (×2): via INTRAVENOUS

## 2016-06-16 MED ORDER — MAGNESIUM CITRATE PO SOLN: 1.0000 | Freq: Once | ORAL | Status: DC | PRN

## 2016-06-16 MED ORDER — DIPHENHYDRAMINE HCL 12.5 MG/5ML PO ELIX: 12.5000 mg | ORAL_SOLUTION | ORAL | Status: DC | PRN

## 2016-06-16 MED ORDER — ZOLPIDEM TARTRATE 5 MG PO TABS
5.0000 mg | ORAL_TABLET | Freq: Every evening | ORAL | Status: DC | PRN
  Filled 2016-06-16: qty 1

## 2016-06-16 MED ORDER — FENTANYL CITRATE (PF) 100 MCG/2ML IJ SOLN
INTRAMUSCULAR | Status: AC
  Filled 2016-06-16: qty 2

## 2016-06-16 MED ORDER — SODIUM CHLORIDE 0.9 % IR SOLN
Status: DC | PRN
  Administered 2016-06-16: 1000 mL

## 2016-06-16 MED ORDER — BUPIVACAINE IN DEXTROSE 0.75-8.25 % IT SOLN
INTRATHECAL | Status: DC | PRN
  Administered 2016-06-16: 15 mg via INTRATHECAL

## 2016-06-16 MED ORDER — HYDROCODONE-ACETAMINOPHEN 7.5-325 MG PO TABS
1.0000 | ORAL_TABLET | Freq: Four times a day (QID) | ORAL | Status: DC
  Administered 2016-06-16 – 2016-06-20 (×16): 1 via ORAL
  Filled 2016-06-16 (×16): qty 1

## 2016-06-16 MED ORDER — DOCUSATE SODIUM 100 MG PO CAPS
100.0000 mg | ORAL_CAPSULE | Freq: Two times a day (BID) | ORAL | Status: DC
  Administered 2016-06-16 – 2016-06-20 (×8): 100 mg via ORAL
  Filled 2016-06-16 (×8): qty 1

## 2016-06-16 MED ORDER — PHENYLEPHRINE HCL 10 MG/ML IJ SOLN
INTRAMUSCULAR | Status: DC | PRN
  Administered 2016-06-16 (×2): 80 ug via INTRAVENOUS

## 2016-06-16 MED ORDER — DEXAMETHASONE SODIUM PHOSPHATE 10 MG/ML IJ SOLN
INTRAMUSCULAR | Status: DC | PRN
  Administered 2016-06-16: 10 mg via INTRAVENOUS

## 2016-06-16 MED ORDER — PHENOL 1.4 % MT LIQD: 1.0000 | OROMUCOSAL | Status: DC | PRN

## 2016-06-16 MED ORDER — HYDROCHLOROTHIAZIDE 25 MG PO TABS
25.0000 mg | ORAL_TABLET | Freq: Every day | ORAL | Status: DC
  Administered 2016-06-16 – 2016-06-20 (×5): 25 mg via ORAL
  Filled 2016-06-16 (×5): qty 1

## 2016-06-16 MED ORDER — SODIUM CHLORIDE 0.9% FLUSH
INTRAVENOUS | Status: DC | PRN
  Administered 2016-06-16: 50 mL via INTRAVENOUS

## 2016-06-16 MED ORDER — MIDAZOLAM HCL 2 MG/2ML IJ SOLN
INTRAMUSCULAR | Status: AC
  Filled 2016-06-16: qty 2

## 2016-06-16 MED ORDER — ONDANSETRON HCL 4 MG/2ML IJ SOLN
INTRAMUSCULAR | Status: AC
  Filled 2016-06-16: qty 2

## 2016-06-16 MED ORDER — ACETAMINOPHEN 650 MG RE SUPP: 650.0000 mg | Freq: Four times a day (QID) | RECTAL | Status: DC | PRN

## 2016-06-16 MED ORDER — ONDANSETRON HCL 4 MG/2ML IJ SOLN: 4.0000 mg | Freq: Four times a day (QID) | INTRAMUSCULAR | Status: DC | PRN

## 2016-06-16 MED ORDER — POTASSIUM CHLORIDE IN NACL 20-0.9 MEQ/L-% IV SOLN: INTRAVENOUS | Status: DC

## 2016-06-16 MED ORDER — LIDOCAINE 2% (20 MG/ML) 5 ML SYRINGE
INTRAMUSCULAR | Status: AC
  Filled 2016-06-16: qty 5

## 2016-06-16 MED ORDER — METOCLOPRAMIDE HCL 5 MG PO TABS: 5.0000 mg | ORAL_TABLET | Freq: Three times a day (TID) | ORAL | Status: DC | PRN

## 2016-06-16 MED ORDER — APIXABAN 2.5 MG PO TABS
2.5000 mg | ORAL_TABLET | Freq: Two times a day (BID) | ORAL | Status: DC
  Administered 2016-06-17 – 2016-06-20 (×7): 2.5 mg via ORAL
  Filled 2016-06-16 (×7): qty 1

## 2016-06-16 MED ORDER — ONDANSETRON HCL 4 MG PO TABS: 4.0000 mg | ORAL_TABLET | Freq: Four times a day (QID) | ORAL | Status: DC | PRN

## 2016-06-16 MED ORDER — METOCLOPRAMIDE HCL 5 MG/ML IJ SOLN: 5.0000 mg | Freq: Three times a day (TID) | INTRAMUSCULAR | Status: DC | PRN

## 2016-06-16 SURGICAL SUPPLY — 67 items
BANDAGE ACE 4X5 VEL STRL LF (GAUZE/BANDAGES/DRESSINGS) IMPLANT
BANDAGE ACE 6X5 VEL STRL LF (GAUZE/BANDAGES/DRESSINGS) IMPLANT
BANDAGE ESMARK 6X9 LF (GAUZE/BANDAGES/DRESSINGS) ×1 IMPLANT
BLADE SAGITTAL 25.0X1.19X90 (BLADE) ×2 IMPLANT
BLADE SAGITTAL 25.0X1.19X90MM (BLADE) ×1
BLADE SAW SAG 90X13X1.27 (BLADE) ×3 IMPLANT
BNDG ELASTIC 6X15 VLCR STRL LF (GAUZE/BANDAGES/DRESSINGS) ×3 IMPLANT
BNDG ESMARK 6X9 LF (GAUZE/BANDAGES/DRESSINGS) ×3
BOWL SMART MIX CTS (DISPOSABLE) ×3 IMPLANT
CAP KNEE TOTAL 3 SIGMA ×3 IMPLANT
CEMENT HV SMART SET (Cement) ×6 IMPLANT
COVER SURGICAL LIGHT HANDLE (MISCELLANEOUS) ×3 IMPLANT
CUFF TOURNIQUET SINGLE 34IN LL (TOURNIQUET CUFF) ×3 IMPLANT
CUFF TOURNIQUET SINGLE 44IN (TOURNIQUET CUFF) IMPLANT
DRAPE INCISE IOBAN 66X45 STRL (DRAPES) ×3 IMPLANT
DRAPE ORTHO SPLIT 77X108 STRL (DRAPES) ×4
DRAPE SURG ORHT 6 SPLT 77X108 (DRAPES) ×2 IMPLANT
DRAPE U-SHAPE 47X51 STRL (DRAPES) ×3 IMPLANT
DRSG ADAPTIC 3X8 NADH LF (GAUZE/BANDAGES/DRESSINGS) ×3 IMPLANT
DRSG PAD ABDOMINAL 8X10 ST (GAUZE/BANDAGES/DRESSINGS) ×6 IMPLANT
DURAPREP 26ML APPLICATOR (WOUND CARE) ×3 IMPLANT
ELECT REM PT RETURN 9FT ADLT (ELECTROSURGICAL) ×3
ELECTRODE REM PT RTRN 9FT ADLT (ELECTROSURGICAL) ×1 IMPLANT
EVACUATOR 1/8 PVC DRAIN (DRAIN) IMPLANT
FACESHIELD WRAPAROUND (MASK) ×6 IMPLANT
FLOSEAL 10ML (HEMOSTASIS) IMPLANT
GAUZE SPONGE 4X4 12PLY STRL (GAUZE/BANDAGES/DRESSINGS) ×6 IMPLANT
GLOVE BIOGEL PI IND STRL 8 (GLOVE) ×4 IMPLANT
GLOVE BIOGEL PI INDICATOR 8 (GLOVE) ×8
GLOVE ORTHO TXT STRL SZ7.5 (GLOVE) ×3 IMPLANT
GLOVE SURG ORTHO 8.0 STRL STRW (GLOVE) ×3 IMPLANT
GOWN STRL REUS W/ TWL LRG LVL3 (GOWN DISPOSABLE) ×2 IMPLANT
GOWN STRL REUS W/ TWL XL LVL3 (GOWN DISPOSABLE) ×1 IMPLANT
GOWN STRL REUS W/TWL 2XL LVL3 (GOWN DISPOSABLE) ×3 IMPLANT
GOWN STRL REUS W/TWL LRG LVL3 (GOWN DISPOSABLE) ×4
GOWN STRL REUS W/TWL XL LVL3 (GOWN DISPOSABLE) ×2
HANDPIECE INTERPULSE COAX TIP (DISPOSABLE) ×2
HOOD PEEL AWAY FACE SHEILD DIS (HOOD) ×3 IMPLANT
IMMOBILIZER KNEE 22 UNIV (SOFTGOODS) IMPLANT
KIT BASIN OR (CUSTOM PROCEDURE TRAY) ×3 IMPLANT
KIT ROOM TURNOVER OR (KITS) ×3 IMPLANT
MANIFOLD NEPTUNE II (INSTRUMENTS) ×3 IMPLANT
NEEDLE 22X1 1/2 (OR ONLY) (NEEDLE) ×6 IMPLANT
NS IRRIG 1000ML POUR BTL (IV SOLUTION) ×3 IMPLANT
PACK TOTAL JOINT (CUSTOM PROCEDURE TRAY) ×3 IMPLANT
PAD ABD 8X10 STRL (GAUZE/BANDAGES/DRESSINGS) ×3 IMPLANT
PAD ARMBOARD 7.5X6 YLW CONV (MISCELLANEOUS) ×6 IMPLANT
PAD CAST 4YDX4 CTTN HI CHSV (CAST SUPPLIES) ×1 IMPLANT
PADDING CAST COTTON 4X4 STRL (CAST SUPPLIES) ×2
PADDING CAST COTTON 6X4 STRL (CAST SUPPLIES) ×3 IMPLANT
SET HNDPC FAN SPRY TIP SCT (DISPOSABLE) ×1 IMPLANT
SPONGE GAUZE 4X4 12PLY STER LF (GAUZE/BANDAGES/DRESSINGS) ×3 IMPLANT
STAPLER VISISTAT 35W (STAPLE) IMPLANT
SUCTION FRAZIER HANDLE 10FR (MISCELLANEOUS) ×2
SUCTION TUBE FRAZIER 10FR DISP (MISCELLANEOUS) ×1 IMPLANT
SUT ETHIBOND NAB CT1 #1 30IN (SUTURE) ×9 IMPLANT
SUT VIC AB 0 CT1 27 (SUTURE) ×2
SUT VIC AB 0 CT1 27XBRD ANBCTR (SUTURE) ×1 IMPLANT
SUT VIC AB 2-0 CT1 27 (SUTURE) ×4
SUT VIC AB 2-0 CT1 TAPERPNT 27 (SUTURE) ×2 IMPLANT
SYR CONTROL 10ML LL (SYRINGE) ×6 IMPLANT
TOWEL OR 17X24 6PK STRL BLUE (TOWEL DISPOSABLE) ×3 IMPLANT
TOWEL OR 17X26 10 PK STRL BLUE (TOWEL DISPOSABLE) ×3 IMPLANT
TRAY CATH 16FR W/PLASTIC CATH (SET/KITS/TRAYS/PACK) IMPLANT
TRAY FOLEY BAG SILVER LF 16FR (CATHETERS) ×3 IMPLANT
TRAY FOLEY CATH 16FRSI W/METER (SET/KITS/TRAYS/PACK) IMPLANT
WATER STERILE IRR 1000ML POUR (IV SOLUTION) ×3 IMPLANT

## 2016-06-16 NOTE — H&P (View-Only) (Signed)
TOTAL KNEE ADMISSION H&P  Patient is being admitted for right total knee arthroplasty.  Subjective:  Chief Complaint:right knee pain.  HPI: Mia Simpson, 83 y.o. female, has a history of pain and functional disability in the right knee due to arthritis and has failed non-surgical conservative treatments for greater than 12 weeks to includeNSAID's and/or analgesics, corticosteriod injections, viscosupplementation injections, use of assistive devices and activity modification.  Onset of symptoms was gradual, starting >10 years ago with gradually worsening course since that time. The patient noted no past surgery on the right knee(s).  Patient currently rates pain in the right knee(s) at 10 out of 10 with activity. Patient has night pain, worsening of pain with activity and weight bearing, pain that interferes with activities of daily living, pain with passive range of motion, crepitus and joint swelling.  Patient has evidence of periarticular osteophytes and joint space narrowing by imaging studies.  There is no active infection.  Patient Active Problem List   Diagnosis Date Noted  . Benign essential HTN 05/01/2016  . Preoperative clearance 05/01/2016  . DJD (degenerative joint disease) of knee 05/01/2016   Past Medical History:  Diagnosis Date  . Adenomatous colon polyp 2012  . Arthritis   . Benign essential HTN 05/01/2016  . Hypertension   . Obesity   . Osteoarthritis   . Senile purpura ([place])     Past Surgical History:  Procedure Laterality Date  . COLONOSCOPY W/ BIOPSIES  2012  . TOTAL ABDOMINAL HYSTERECTOMY W/ BILATERAL SALPINGOOPHORECTOMY  1975     (Not in a hospital admission) No Known Allergies  Social History  Substance Use Topics  . Smoking status: Never Smoker  . Smokeless tobacco: Never Used  . Alcohol use No    Family History  Problem Relation Age of Onset  . Gout Father   . Diabetes Sister   . Breast cancer Maternal Aunt   . Prostate cancer Son       ROS  Objective:  Physical Exam  Vital signs in last 24 hours: @VSRANGES @  Labs:   Estimated body mass index is 35.7 kg/m as calculated from the following:   Height as of 05/01/16: 5\' 10"  (1.778 m).   Weight as of 05/01/16: 112.9 kg (248 lb 12.8 oz).   Imaging Review Plain radiographs demonstrate severe degenerative joint disease of the right knee(s). The overall alignment ismild varus. The bone quality appears to be good for age and reported activity level.  Assessment/Plan:  End stage arthritis, right knee   The patient history, physical examination, clinical judgment of the provider and imaging studies are consistent with end stage degenerative joint disease of the right knee(s) and total knee arthroplasty is deemed medically necessary. The treatment options including medical management, injection therapy arthroscopy and arthroplasty were discussed at length. The risks and benefits of total knee arthroplasty were presented and reviewed. The risks due to aseptic loosening, infection, stiffness, patella tracking problems, thromboembolic complications and other imponderables were discussed. The patient acknowledged the explanation, agreed to proceed with the plan and consent was signed. Patient is being admitted for inpatient treatment for surgery, pain control, PT, OT, prophylactic antibiotics, VTE prophylaxis, progressive ambulation and ADL's and discharge planning. The patient is planning to be discharged to skilled nursing facility [facility]

## 2016-06-16 NOTE — Op Note (Signed)
NAME:  Mia Simpson, Mia Simpson NO.:  MEDICAL RECORD NO.:  [id number]  LOCATION:                                 FACILITY:  PHYSICIAN:  Lockie Pares, M.D.         DATE OF BIRTH:  DATE OF PROCEDURE:  06/16/2016 DATE OF DISCHARGE:                              OPERATIVE REPORT   PREOPERATIVE DIAGNOSIS:  Severe osteoarthritis, right knee with valgus deformity.  POSTOPERATIVE DIAGNOSIS:  Severe osteoarthritis, right knee with valgus deformity.  OPERATION:  Right total knee replacement (Sigma size 4 narrow femur, 4 tibia, 10 mm bearing with 38 mm all poly patella).  SURGEON:  Lockie Pares, M.D.  ASSISTANT:  Chriss Czar, PA.  ANESTHESIA:  Spinal with a nerve block.  TOURNIQUET TIME:  61 minutes.  DESCRIPTION OF PROCEDURE:  Supine position, exsanguination of the legs, inflation of tourniquet to 375.  Straight skin incision was made with medial parapatellar approach to the knee made.  We cut 11 mm 5 degree valgus cut off the distal femur.  Identified the most diseased lateral compartment with a conservative cut about 2 to 3 mm below the lateral compartment.  Extension gap was measured at 10 mm.  Femur was sized to be a size 4, eventually a 4 narrow.  We then placed all in one cutting block with appropriate degree of external rotation cutting the anterior- posterior cuts as well as the chamfer cuts.  Keel hole was cut for the tibial base plate trial and prior to this, the flexion gap was measured at 10 mm.  PCL was completely released.  Box cut was then made for the femur.  We placed the femoral and tibial trials followed by cutting the patella, which showed significant amount particularly lateral wear leaving about 15 mm of native patella for 38 mm all poly trial.  Again all parameters deemed to be acceptable relative to good tracking of the bearing, good balancing of the ligament, no tendency for bearing, spin out and full extension were noted.  The trial  components were removed. Pulsatile irrigation was used.  We then infiltrated a mixture of Marcaine, epinephrine, and Exparel into the capsule and subcutaneous tissues.  Cement was inserted in the doughy state, tibia followed by femur and patella.  We elected to use a trial bearing while the cement hardened.  Trial bearing was removed.  Excess cement was removed from posterior aspect of the knee and the tourniquet was released.  There was no excessive bleeding noted.  The final bearing was placed, 10 mm bearing.  All the parameters checked relative to range of motion and stability were deemed to be acceptable.  Closure was affected with #1 Ethibond, 0 and 2- 0 Vicryl, and skin clips on the skin.  Lightly compressive sterile dressing applied.  Knee immobilizer.  Taken to the recovery room in a stable condition.     Lockie Pares, M.D.     WDC/MEDQ  D:  06/16/2016  T:  06/16/2016  Job:  [id number]

## 2016-06-16 NOTE — Interval H&P Note (Signed)
History and Physical Interval Note:  06/16/2016 7:34 AM  [name]  has presented today for surgery, with the diagnosis of OA RIGHT KNEE  The various methods of treatment have been discussed with the patient and family. After consideration of risks, benefits and other options for treatment, the patient has consented to  Procedure(s): TOTAL KNEE ARTHROPLASTY (Right) as a surgical intervention .  The patient's history has been reviewed, patient examined, no change in status, stable for surgery.  I have reviewed the patient's chart and labs.  Questions were answered to the patient's satisfaction.     Tokiko Diefenderfer JR,W D

## 2016-06-16 NOTE — Anesthesia Procedure Notes (Signed)
Spinal  Patient location during procedure: OR Start time: 06/16/2016 7:43 AM End time: 06/16/2016 7:51 AM Staffing Anesthesiologist: Duane Boston Performed: anesthesiologist  Preanesthetic Checklist Completed: patient identified, surgical consent, pre-op evaluation, timeout performed, IV checked, risks and benefits discussed and monitors and equipment checked Spinal Block Patient position: sitting Prep: DuraPrep Patient monitoring: cardiac monitor, continuous pulse ox and blood pressure Approach: right paramedian Location: L2-3 Injection technique: single-shot Needle Needle type: Quincke  Needle gauge: 22 G Needle length: 9 cm Additional Notes Functioning IV was confirmed and monitors were applied. Sterile prep and drape, including hand hygiene and sterile gloves were used. The patient was positioned and the spine was prepped. The skin was anesthetized with lidocaine.  Free flow of clear CSF was obtained prior to injecting local anesthetic into the CSF.  The spinal needle aspirated freely following injection.  The needle was carefully withdrawn.  The patient tolerated the procedure well.

## 2016-06-16 NOTE — Anesthesia Procedure Notes (Signed)
Anesthesia Regional Block:  Adductor canal block  Pre-Anesthetic Checklist: ,, timeout performed, Correct Patient, Correct Site, Correct Laterality, Correct Procedure, Correct Position, site marked, Risks and benefits discussed,  Surgical consent,  Pre-op evaluation,  At surgeon's request and post-op pain management  Laterality: Right  Prep: chloraprep       Needles:  Injection technique: Single-shot  Needle Type: Stimulator Needle - 80     Needle Length: 10cm 10 cm Needle Gauge: 21 and 21 G    Additional Needles:  Procedures: ultrasound guided (picture in chart) Adductor canal block Narrative:  Start time: 06/16/2016 6:58 AM End time: 06/16/2016 7:08 AM Injection made incrementally with aspirations every 5 mL.  Performed by: Personally

## 2016-06-16 NOTE — Transfer of Care (Signed)
Immediate Anesthesia Transfer of Care Note  Patient: Mia Simpson  Procedure(s) Performed: Procedure(s): TOTAL KNEE ARTHROPLASTY (Right)  Patient Location: PACU  Anesthesia Type:MAC and Spinal  Level of Consciousness: awake, alert , oriented and patient cooperative  Airway & Oxygen Therapy: Patient Spontanous Breathing  Post-op Assessment: Report given to RN and Post -op Vital signs reviewed and stable  Post vital signs: Reviewed  Last Vitals:  Vitals:   06/16/16 0705 06/16/16 0707  BP: (!) 141/55 (!) 124/48  Pulse: 87 87  Resp: 12 12  Temp:      Last Pain:  Vitals:   06/16/16 0648  PainSc: 6          Complications: No apparent anesthesia complications

## 2016-06-16 NOTE — Anesthesia Postprocedure Evaluation (Signed)
Anesthesia Post Note  Patient: Mia Simpson  Procedure(s) Performed: Procedure(s) (LRB): TOTAL KNEE ARTHROPLASTY (Right)  Patient location during evaluation: PACU Anesthesia Type: MAC and Spinal Level of consciousness: awake and alert Pain management: pain level controlled Vital Signs Assessment: post-procedure vital signs reviewed and stable Respiratory status: spontaneous breathing and respiratory function stable Cardiovascular status: blood pressure returned to baseline and stable Postop Assessment: spinal receding Anesthetic complications: no       Last Vitals:  Vitals:   06/16/16 1300 06/16/16 1306  BP: (!) 141/118 137/74  Pulse: 71 73  Resp: 18 16  Temp: 36.7 C     Last Pain:  Vitals:   06/16/16 1300  PainSc: 0-No pain                 Maydelin Deming DANIEL

## 2016-06-17 LAB — CBC
HEMATOCRIT: 33.6 % — AB (ref 36.0–46.0)
Hemoglobin: 11.1 g/dL — ABNORMAL LOW (ref 12.0–15.0)
MCH: 30.1 pg (ref 26.0–34.0)
MCHC: 33 g/dL (ref 30.0–36.0)
MCV: 91.1 fL (ref 78.0–100.0)
PLATELETS: 152 10*3/uL (ref 150–400)
RBC: 3.69 MIL/uL — ABNORMAL LOW (ref 3.87–5.11)
RDW: 15.5 % (ref 11.5–15.5)
WBC: 9.7 10*3/uL (ref 4.0–10.5)

## 2016-06-17 LAB — BASIC METABOLIC PANEL
ANION GAP: 8 (ref 5–15)
BUN: 18 mg/dL (ref 6–20)
CALCIUM: 8.6 mg/dL — AB (ref 8.9–10.3)
CO2: 26 mmol/L (ref 22–32)
Chloride: 106 mmol/L (ref 101–111)
Creatinine, Ser: 0.81 mg/dL (ref 0.44–1.00)
GFR calc Af Amer: >60 mL/min (ref >60)
GLUCOSE: 145 mg/dL — AB (ref 65–99)
Potassium: 4 mmol/L (ref 3.5–5.1)
Sodium: 140 mmol/L (ref 135–145)

## 2016-06-17 NOTE — Progress Notes (Signed)
Orthopedic Tech Progress Note Patient Details:  Mia Simpson 09/20/1927 [id number]  Patient ID: [name], female   DOB: [birth year], 83 y.o.   MRN: [id number] Applied cpm 0-60  [name] 06/17/2016, 6:25 AM

## 2016-06-17 NOTE — Progress Notes (Signed)
Subjective: 1 Day Post-Op Procedure(s) (LRB): TOTAL KNEE ARTHROPLASTY (Right) Patient reports pain as 3 on 0-10 scale and 5 on 0-10 scale.    Objective: Vital signs in last 24 hours: Temp:  [97.9 F (36.6 C)-98.8 F (37.1 C)] 98.2 F (36.8 C) (01/20 1422) Pulse Rate:  [81-91] 85 (01/20 1422) Resp:  [11-27] 18 (01/20 1422) BP: (90-142)/(47-81) 90/62 (01/20 1422) SpO2:  [95 %-100 %] 95 % (01/20 1422)  Intake/Output from previous day: 01/19 0701 - 01/20 0700 In: 1540 [P.O.:240; I.V.:1200; IV Piggyback:100] Out: 1525 [Urine:1475; Blood:50] Intake/Output this shift: Total I/O In: 780 [P.O.:780] Out: -    Recent Labs  06/17/16 0417  HGB 11.1*    Recent Labs  06/17/16 0417  WBC 9.7  RBC 3.69*  HCT 33.6*  PLT 152    Recent Labs  06/17/16 0417  NA 140  K 4.0  CL 106  CO2 26  BUN 18  CREATININE 0.81  GLUCOSE 145*  CALCIUM 8.6*   No results for input(s): LABPT, INR in the last 72 hours.  ABD soft Neurovascular intact Sensation intact distally Intact pulses distally Incision: dressing C/D/I  Assessment/Plan: 1 Day Post-Op Procedure(s) (LRB): TOTAL KNEE ARTHROPLASTY (Right) Advance diet Up with therapy D/C IV fluids Discharge to SNF on Monday  Jezelle Gullick J 06/17/2016, 2:38 PM

## 2016-06-17 NOTE — Evaluation (Signed)
Occupational Therapy Evaluation Patient Details Name: Mia Simpson MRN: [id number] DOB: Dec 22, 1927 Today's Date: 06/17/2016    History of Present Illness Mia Simpson is an 83 y.o. female s/p R TKA. She has a PMH significant for benign essential HTN, arthritis, adenomatous colon polyp, hypertension, obesity, osteoarthritis, and senile purpura.   Clinical Impression   PTA, pt reports independence with assistive devices for ADL and functional mobility. Currently, pt requires mod assist for stand-pivot toilet transfers and mod assist for LB ADL. Due to current functional decline from PLOF and pt living alone, feel pt will benefit from short-term SNF placement for continued rehabilitation services prior to D/C home in order to maximize safety and independence with ADL. Pt is in agreement and is making plans to D/C to [facility]. All further OT needs can be met at the next venue of care and acute OT will sign off.    Follow Up Recommendations  SNF;Supervision/Assistance - 24 hour    Equipment Recommendations  Other (comment) (TBD at next venue of care)    Recommendations for Other Services       Precautions / Restrictions Precautions Precautions: Knee;Fall Precaution Booklet Issued: No Precaution Comments: Reviewed knee precautions during ADL Required Braces or Orthoses: Knee Immobilizer - Right Knee Immobilizer - Right: On except when in CPM Restrictions Weight Bearing Restrictions: Yes RLE Weight Bearing: Weight bearing as tolerated      Mobility Bed Mobility Overal bed mobility: Needs Assistance Bed Mobility: Supine to Sit;Sit to Supine     Supine to sit: Mod assist Sit to supine: Mod assist      Transfers Overall transfer level: Needs assistance Equipment used: Rolling walker (2 wheeled) Transfers: Sit to/from Stand Sit to Stand: Min assist              Balance Overall balance assessment: Needs assistance Sitting-balance support: No upper extremity  supported;Feet supported Sitting balance-Leahy Scale: Good     Standing balance support: Bilateral upper extremity supported;During functional activity;No upper extremity supported Standing balance-Leahy Scale: Fair Standing balance comment: Able to statically stand without UE support for clothing management but relies on RW for functional mobility.                            ADL Overall ADL's : Needs assistance/impaired     Grooming: Set up;Sitting   Upper Body Bathing: Sitting;Set up   Lower Body Bathing: Moderate assistance;Sit to/from stand Lower Body Bathing Details (indicate cue type and reason): Min assist sit<>stand Upper Body Dressing : Sitting;Set up   Lower Body Dressing: Moderate assistance;Sit to/from stand Lower Body Dressing Details (indicate cue type and reason): Min assist sit<>stand Toilet Transfer: Moderate assistance;Stand-pivot;BSC;RW;+2 for safety/equipment   Toileting- Clothing Manipulation and Hygiene: Moderate assistance;+2 for safety/equipment;Sit to/from stand Toileting - Clothing Manipulation Details (indicate cue type and reason): Mod assist to maintain balance while completing toilet hygeine.     Functional mobility during ADLs: Moderate assistance;+2 for safety/equipment;Rolling walker General ADL Comments: Pt able to complete stand-pivot toilet transfer to [facility] with mod assist +2 for safety. Pt would be able to complete with +1 assist. Educated pt on knee precuations during ADL, use of ice for pain management, and benefits of early mobility.     Vision Vision Assessment?: No apparent visual deficits   Perception     Praxis      Pertinent Vitals/Pain Pain Assessment: Faces Faces Pain Scale: Hurts even more Pain Location: R knee Pain Descriptors /  Indicators: Aching;Grimacing;Operative site guarding;Sore Pain Intervention(s): Limited activity within patient's tolerance;Monitored during session;Repositioned;Ice applied     Hand  Dominance Right   Extremity/Trunk Assessment Upper Extremity Assessment Upper Extremity Assessment: Overall WFL for tasks assessed   Lower Extremity Assessment Lower Extremity Assessment: RLE deficits/detail RLE Deficits / Details: Decreased strength and ROM as expected post-operatively. RLE: Unable to fully assess due to pain       Communication Communication Communication: No difficulties   Cognition Arousal/Alertness: Awake/alert Behavior During Therapy: WFL for tasks assessed/performed;Anxious Overall Cognitive Status: Within Functional Limits for tasks assessed                     General Comments       Exercises       Shoulder Instructions      Home Living Family/patient expects to be discharged to:: Skilled nursing facility Living Arrangements: Alone                               Additional Comments: Pt lives alone in a one-story home. She has a RW and SPC at home.      Prior Functioning/Environment Level of Independence: Independent with assistive device(s)        Comments: Using cane        OT Problem List: Decreased strength;Decreased range of motion;Decreased activity tolerance;Impaired balance (sitting and/or standing);Decreased safety awareness;Decreased knowledge of use of DME or AE;Decreased knowledge of precautions;Pain   OT Treatment/Interventions:      OT Goals(Current goals can be found in the care plan section) Acute Rehab OT Goals Patient Stated Goal: go to church conference in a few months OT Goal Formulation: With patient Time For Goal Achievement: 06/24/16 Potential to Achieve Goals: Good  OT Frequency:     Barriers to D/C:            Co-evaluation PT/OT/SLP Co-Evaluation/Treatment: Yes Reason for Co-Treatment: For patient/therapist safety;To address functional/ADL transfers   OT goals addressed during session: ADL's and self-care      End of Session Equipment Utilized During Treatment: Gait  belt;Rolling walker  Activity Tolerance: Patient tolerated treatment well Patient left: in bed;with call bell/phone within reach   Time: 1138-1203 OT Time Calculation (min): 25 min Charges:  OT General Charges $OT Visit: 1 Procedure OT Evaluation $OT Eval Moderate Complexity: 1 Procedure G-CodesNorman Herrlich, OTR/L [phone] 06/17/2016, 2:22 PM

## 2016-06-17 NOTE — Evaluation (Signed)
Physical Therapy Evaluation Patient Details Name: Mia Simpson MRN: [id number] DOB: [birth year] Today's Date: 06/17/2016   History of Present Illness  Mia Simpson is an 83 y.o. female s/p R TKA. She has a PMH significant for benign essential HTN, arthritis, adenomatous colon polyp, hypertension, obesity, osteoarthritis, and senile purpura.  Clinical Impression  Pt is POD 1 and moving well with therapy. Pt is limited this session by fatigue. Prior to admission, pt was completely independent and living alone. Pt now requires Min A for majority of mobility and has been unable to perform gait for any distance. Pt will benefit from SNF placement in order to maximize her functional outcomes. Pt continues to require acute services to assist with transitioning to next venue of care.     Follow Up Recommendations SNF;Supervision/Assistance - 24 hour    Equipment Recommendations  None recommended by PT    Recommendations for Other Services       Precautions / Restrictions Precautions Precautions: Knee;Fall Precaution Booklet Issued: Yes (comment) Precaution Comments: Reviewed knee precautions during ADL Required Braces or Orthoses: Knee Immobilizer - Right Knee Immobilizer - Right: On except when in CPM Restrictions Weight Bearing Restrictions: Yes RLE Weight Bearing: Weight bearing as tolerated      Mobility  Bed Mobility Overal bed mobility: Needs Assistance Bed Mobility: Supine to Sit;Sit to Supine     Supine to sit: Mod assist Sit to supine: Mod assist      Transfers Overall transfer level: Needs assistance Equipment used: Rolling walker (2 wheeled) Transfers: Sit to/from Stand Sit to Stand: Min assist         General transfer comment: Min A for safety from EOB To [facility]  Ambulation/Gait                Stairs            Wheelchair Mobility    Modified Rankin (Stroke Patients Only)       Balance Overall balance assessment: Needs  assistance Sitting-balance support: No upper extremity supported;Feet supported Sitting balance-Leahy Scale: Good     Standing balance support: Bilateral upper extremity supported;During functional activity;No upper extremity supported Standing balance-Leahy Scale: Poor Standing balance comment: Relies on RW for mobility                             Pertinent Vitals/Pain Pain Assessment: Faces Faces Pain Scale: Hurts even more Pain Location: R knee Pain Descriptors / Indicators: Aching;Grimacing;Operative site guarding;Sore Pain Intervention(s): Limited activity within patient's tolerance;Monitored during session;Repositioned;Ice applied    Home Living Family/patient expects to be discharged to:: Skilled nursing facility Living Arrangements: Alone               Additional Comments: Pt lives alone in a one-story home. She has a RW and SPC at home.    Prior Function Level of Independence: Independent with assistive device(s)         Comments: Using cane     Hand Dominance   Dominant Hand: Right    Extremity/Trunk Assessment   Upper Extremity Assessment Upper Extremity Assessment: Defer to OT evaluation    Lower Extremity Assessment Lower Extremity Assessment: RLE deficits/detail RLE Deficits / Details: pt with normal post op pain and weakness. at least 3/5 ankle and 2/5 knee and hip per gross functional assessment. RLE: Unable to fully assess due to pain       Communication   Communication: No difficulties  Cognition Arousal/Alertness: Awake/alert Behavior  During Therapy: WFL for tasks assessed/performed;Anxious Overall Cognitive Status: Within Functional Limits for tasks assessed                      General Comments      Exercises     Assessment/Plan    PT Assessment Patient needs continued PT services  PT Problem List Decreased strength;Decreased range of motion;Decreased activity tolerance;Decreased balance;Decreased  mobility;Decreased coordination;Decreased knowledge of use of DME;Pain          PT Treatment Interventions DME instruction;Gait training;Stair training;Functional mobility training;Therapeutic activities;Therapeutic exercise;Balance training    PT Goals (Current goals can be found in the Care Plan section)  Acute Rehab PT Goals Patient Stated Goal: go to church conference in a few months PT Goal Formulation: With patient Time For Goal Achievement: 06/24/16 Potential to Achieve Goals: Good    Frequency 7X/week   Barriers to discharge        Co-evaluation PT/OT/SLP Co-Evaluation/Treatment: Yes Reason for Co-Treatment: For patient/therapist safety;To address functional/ADL transfers PT goals addressed during session: Mobility/safety with mobility         End of Session Equipment Utilized During Treatment: Gait belt Activity Tolerance: Patient limited by pain;Patient limited by fatigue Patient left: in bed;with call bell/phone within reach;with SCD's reapplied Nurse Communication: Mobility status         Time: 1147-1200 PT Time Calculation (min) (ACUTE ONLY): 13 min   Charges:   PT Evaluation $PT Eval Moderate Complexity: 1 Procedure     PT G Codes:        Scheryl Marten PT, DPT  [phone]  06/17/2016, 6:26 PM

## 2016-06-17 NOTE — Progress Notes (Signed)
Physical Therapy Treatment Note  Pt presents with increased fatigue this session and requires increased assistance getting back to bed. Guided pt through supine strengthening exercises and ROM measurements were taken. Pt would benefit from short distance gait training next session.     06/17/16 1828  PT Visit Information  Last PT Received On 06/17/16  Assistance Needed +1  History of Present Illness Mia Simpson is an 83 y.o. female s/p R TKA. She has a PMH significant for benign essential HTN, arthritis, adenomatous colon polyp, hypertension, obesity, osteoarthritis, and senile purpura.  Subjective Data  Subjective pt states that she is really tired this afternoon  Patient Stated Goal go to church conference in a few months  Precautions  Precautions Knee;Fall  Precaution Booklet Issued Yes (comment)  Precaution Comments Reviewed knee precautions during ADL  Required Braces or Orthoses Knee Immobilizer - Right  Knee Immobilizer - Right On except when in CPM  Restrictions  Weight Bearing Restrictions Yes  RLE Weight Bearing WBAT  Pain Assessment  Pain Assessment Faces  Faces Pain Scale 4  Pain Location R knee  Pain Descriptors / Indicators Aching;Grimacing;Operative site guarding;Sore  Pain Intervention(s) Monitored during session;Limited activity within patient's tolerance;Repositioned;Ice applied  Cognition  Arousal/Alertness Awake/alert  Behavior During Therapy WFL for tasks assessed/performed;Anxious  Overall Cognitive Status Within Functional Limits for tasks assessed  Bed Mobility  Overal bed mobility Needs Assistance  Bed Mobility Sit to Supine  Sit to supine Mod assist  General bed mobility comments Mod A to bring RLE into bed  Transfers  Overall transfer level Needs assistance  Equipment used Rolling walker (2 wheeled)  Transfers Sit to/from Stand  Sit to Stand Min assist  General transfer comment Min A for safety from [facility] to bed  Exercises  Exercises Total Joint   Total Joint Exercises  Ankle Circles/Pumps AROM;Both;20 reps;Supine  Quad Sets AROM;Right;10 reps;Supine  Heel Slides AAROM;Right;10 reps;Supine  Goniometric ROM 5-75  PT - End of Session  Equipment Utilized During Treatment Gait belt  Activity Tolerance Patient limited by pain;Patient limited by fatigue  Patient left in bed;with call bell/phone within reach;with SCD's reapplied;with family/visitor present  Nurse Communication Mobility status  PT - Assessment/Plan  PT Plan Current plan remains appropriate  PT Frequency (ACUTE ONLY) 7X/week  Follow Up Recommendations SNF;Supervision/Assistance - 24 hour  PT equipment None recommended by PT  PT Goal Progression  Progress towards PT goals Progressing toward goals  PT Time Calculation  PT Start Time (ACUTE ONLY) 1540  PT Stop Time (ACUTE ONLY) 1559  PT Time Calculation (min) (ACUTE ONLY) 19 min  PT General Charges  $$ ACUTE PT VISIT 1 Procedure  PT Treatments  $Therapeutic Exercise 8-22 mins   Scheryl Marten PT, DPT  [phone]

## 2016-06-18 LAB — BASIC METABOLIC PANEL
Anion gap: 10 (ref 5–15)
BUN: 11 mg/dL (ref 6–20)
CALCIUM: 8.7 mg/dL — AB (ref 8.9–10.3)
CO2: 27 mmol/L (ref 22–32)
CREATININE: 0.87 mg/dL (ref 0.44–1.00)
Chloride: 98 mmol/L — ABNORMAL LOW (ref 101–111)
GFR calc Af Amer: >60 mL/min (ref >60)
GFR, EST NON AFRICAN AMERICAN: 59 mL/min — AB (ref >60)
GLUCOSE: 132 mg/dL — AB (ref 65–99)
Potassium: 3.2 mmol/L — ABNORMAL LOW (ref 3.5–5.1)
Sodium: 135 mmol/L (ref 135–145)

## 2016-06-18 LAB — URINALYSIS, ROUTINE W REFLEX MICROSCOPIC
Bacteria, UA: NONE SEEN
Bilirubin Urine: NEGATIVE
GLUCOSE, UA: NEGATIVE mg/dL
Ketones, ur: NEGATIVE mg/dL
Leukocytes, UA: NEGATIVE
Nitrite: NEGATIVE
PH: 6 (ref 5.0–8.0)
PROTEIN: NEGATIVE mg/dL
SQUAMOUS EPITHELIAL / LPF: NONE SEEN
Specific Gravity, Urine: 1.009 (ref 1.005–1.030)

## 2016-06-18 LAB — CBC
HCT: 33.8 % — ABNORMAL LOW (ref 36.0–46.0)
Hemoglobin: 11.2 g/dL — ABNORMAL LOW (ref 12.0–15.0)
MCH: 29.8 pg (ref 26.0–34.0)
MCHC: 33.1 g/dL (ref 30.0–36.0)
MCV: 89.9 fL (ref 78.0–100.0)
PLATELETS: 148 10*3/uL — AB (ref 150–400)
RBC: 3.76 MIL/uL — ABNORMAL LOW (ref 3.87–5.11)
RDW: 15.6 % — AB (ref 11.5–15.5)
WBC: 11.2 10*3/uL — AB (ref 4.0–10.5)

## 2016-06-18 MED ORDER — POTASSIUM CHLORIDE CRYS ER 20 MEQ PO TBCR
20.0000 meq | EXTENDED_RELEASE_TABLET | Freq: Two times a day (BID) | ORAL | Status: DC
  Administered 2016-06-19 – 2016-06-20 (×3): 20 meq via ORAL
  Filled 2016-06-18 (×3): qty 1

## 2016-06-18 MED ORDER — DEXTROSE 5 % IV SOLN
1.0000 g | INTRAVENOUS | Status: DC
  Administered 2016-06-18 – 2016-06-19 (×2): 1 g via INTRAVENOUS
  Filled 2016-06-18 (×4): qty 10

## 2016-06-18 NOTE — Progress Notes (Signed)
Subjective: 2 Days Post-Op Procedure(s) (LRB): TOTAL KNEE ARTHROPLASTY (Right) Patient reports pain as 5 on 0-10 scale.    Objective: Vital signs in last 24 hours: Temp:  [99.2 F (37.3 C)-100.5 F (38.1 C)] 99.6 F (37.6 C) (01/21 1531) Pulse Rate:  [95-113] 113 (01/21 1531) Resp:  [16-18] 18 (01/21 1531) BP: (120-131)/(54-59) 120/59 (01/21 1531) SpO2:  [94 %-100 %] 100 % (01/21 1531)  Intake/Output from previous day: 01/20 0701 - 01/21 0700 In: 1660 [P.O.:1660] Out: -  Intake/Output this shift: Total I/O In: 480 [P.O.:480] Out: 800 [Urine:800]   Recent Labs  06/17/16 0417 06/18/16 0540  HGB 11.1* 11.2*    Recent Labs  06/17/16 0417 06/18/16 0540  WBC 9.7 11.2*  RBC 3.69* 3.76*  HCT 33.6* 33.8*  PLT 152 148*    Recent Labs  06/17/16 0417 06/18/16 0540  NA 140 135  K 4.0 3.2*  CL 106 98*  CO2 26 27  BUN 18 11  CREATININE 0.81 0.87  GLUCOSE 145* 132*  CALCIUM 8.6* 8.7*   No results for input(s): LABPT, INR in the last 72 hours.  ABD soft Neurovascular intact Sensation intact distally Intact pulses distally Dorsiflexion/Plantar flexion intact Incision: dressing C/D/I  Assessment/Plan: 2 Days Post-Op Procedure(s) (LRB): TOTAL KNEE ARTHROPLASTY (Right) Advance diet Up with therapy Discharge to SNF  Will get cath urine with culture and sensitivity because she is running fevers.  Will start Rocephin 1 gram IV daily.  Patient has a history of urosepsis that she was hospitalized for a month.    Regine Christian J 06/18/2016, 4:49 PM

## 2016-06-18 NOTE — Progress Notes (Signed)
Physical Therapy Treatment Patient Details Name: Mia Simpson MRN: [id number] DOB: September 23, 1927 Today's Date: 06/18/2016    History of Present Illness Mia Simpson is an 83 y.o. female s/p R TKA. She has a PMH significant for benign essential HTN, arthritis, adenomatous colon polyp, hypertension, obesity, osteoarthritis, and senile purpura.    PT Comments    Pt is POD 2 and continues to move well with therapy but slowly. Requires assistance for bed mobility and transfers from elevated surface. Performed short distance gait this session with min cues for staying within walker. Pt is making good progress toward goals with overall improved mobility noted this session.    Follow Up Recommendations  SNF;Supervision/Assistance - 24 hour     Equipment Recommendations  None recommended by PT    Recommendations for Other Services       Precautions / Restrictions Precautions Precautions: Knee;Fall Precaution Booklet Issued: Yes (comment) Precaution Comments: Reviewed knee precautions during ADL Required Braces or Orthoses: Knee Immobilizer - Right Knee Immobilizer - Right: On except when in CPM Restrictions Weight Bearing Restrictions: Yes RLE Weight Bearing: Weight bearing as tolerated    Mobility  Bed Mobility Overal bed mobility: Needs Assistance Bed Mobility: Supine to Sit     Supine to sit: Mod assist     General bed mobility comments: MIn A to bring LE's EOB, scoot bottom toward EOB   Transfers Overall transfer level: Needs assistance Equipment used: Rolling walker (2 wheeled) Transfers: Sit to/from Stand Sit to Stand: Min assist;From elevated surface         General transfer comment: Min guard from elevated surface, MIn A without elevated surface  Ambulation/Gait Ambulation/Gait assistance: Min guard Ambulation Distance (Feet): 10 Feet Assistive device: Rolling walker (2 wheeled) Gait Pattern/deviations: Step-to pattern;Decreased step length - left;Decreased  stance time - right Gait velocity: decreased Gait velocity interpretation: Below normal speed for age/gender General Gait Details: Cues for sequencing and keeping walker close with gait.    Stairs            Wheelchair Mobility    Modified Rankin (Stroke Patients Only)       Balance Overall balance assessment: Needs assistance Sitting-balance support: No upper extremity supported;Feet supported Sitting balance-Leahy Scale: Fair Sitting balance - Comments: posterior lean at EOB requiring self adjustment.    Standing balance support: Bilateral upper extremity supported;During functional activity;No upper extremity supported Standing balance-Leahy Scale: Poor Standing balance comment: Relies on RW for mobility                    Cognition Arousal/Alertness: Awake/alert Behavior During Therapy: WFL for tasks assessed/performed;Anxious Overall Cognitive Status: Within Functional Limits for tasks assessed                      Exercises      General Comments        Pertinent Vitals/Pain Pain Assessment: Faces Faces Pain Scale: Hurts little more Pain Location: R knee with mobility and weight bearing Pain Descriptors / Indicators: Aching;Grimacing;Operative site guarding;Sore Pain Intervention(s): Monitored during session;Limited activity within patient's tolerance;Repositioned;Ice applied    Home Living                      Prior Function            PT Goals (current goals can now be found in the care plan section) Acute Rehab PT Goals Patient Stated Goal: go to church conference in a few months Progress towards  PT goals: Progressing toward goals    Frequency    7X/week      PT Plan Current plan remains appropriate    Co-evaluation             End of Session Equipment Utilized During Treatment: Gait belt;Right knee immobilizer Activity Tolerance: Patient tolerated treatment well Patient left: in chair;with call  bell/phone within reach     Time: 0930-0950 PT Time Calculation (min) (ACUTE ONLY): 20 min  Charges:  $Therapeutic Activity: 8-22 mins                    G Codes:      Scheryl Marten PT, DPT  [phone]  06/18/2016, 9:55 AM

## 2016-06-18 NOTE — Clinical Social Work Note (Signed)
Clinical Social Work Assessment  Patient Details  Name: Mia Simpson MRN: [id number] Date of Birth: 03/01/1928  Date of referral:  06/18/16               Reason for consult:  Facility Placement                Permission sought to share information with:  Family Supports Permission granted to share information::     Name::        Agency::     Relationship::     Contact Information:     Housing/Transportation Living arrangements for the past 2 months:  Single Family Home Source of Information:  Patient Patient Interpreter Needed:  None Criminal Activity/Legal Involvement Pertinent to Current Situation/Hospitalization:  No - Comment as needed Significant Relationships:  Adult Children, Other Family Members Lives with:  Self Do you feel safe going back to the place where you live?  Yes Need for family participation in patient care:     Care giving concerns:  No family or friends present at bedside during initial assessment. Pt alert and oriented.  Social Worker assessment / plan:  CSW spoke with pt at bedside to complete initial assessment. Pt lives home alone. Pt understanding of need for SNF placement and expressed how it is the smartest decision because she lives alone. Pt reports she contacted [facility] prior to surgery. CSW followed up with facility and they confirmed they are expecting pt. Pt is a bundle pt per Debroah Baller. CSW will continue to follow for d/c needs--pt will go by [facility].   Employment status:  Retired Forensic scientist:  Medicare PT Recommendations:  [place] / Referral to community resources:  [place]  Patient/Family's Response to care:  Pt verbalized understanding of CSW role and expressed appreciation for support. Pt denies any concern regarding pt care at this time.  Patient/Family's Understanding of and Emotional Response to Diagnosis, Current Treatment, and Prognosis:  Pt understanding of physical limitations. Pt  understanding the need for SNF and is agreeable. Pt has set up prior arrangments to go to [place]. Pt denies any concern for treatment plan at this time. CSW will continue to provide support.  Emotional Assessment Appearance:  Appears stated age Attitude/Demeanor/Rapport:   (Patient was appropriate) Affect (typically observed):  Accepting, Appropriate, Calm, Pleasant Orientation:  Oriented to Self, Oriented to Place, Oriented to  Time, Oriented to Situation Alcohol / Substance use:  Not Applicable Psych involvement (Current and /or in the community):  No (Comment)  Discharge Needs  Concerns to be addressed:  No discharge needs identified Readmission within the last 30 days:  No Current discharge risk:  Dependent with Mobility Barriers to Discharge:  Continued Medical Work up   [employer], LCSW 06/18/2016, 12:45 PM

## 2016-06-18 NOTE — NC FL2 (Signed)
[place] MEDICAID FL2 LEVEL OF CARE SCREENING TOOL     IDENTIFICATION  Patient Name: Mia Simpson Birthdate: 1928/03/18 Sex: female Admission Date (Current Location): 06/16/2016  [facility] and [place] Number:  Herbalist and Address:  [facility], [place] [address] [place], [place] [address]      Provider Number: [id number]  Attending Physician Name and Address:  Earlie Server, MD  Relative Name and Phone Number:       Current Level of Care: Hospital Recommended Level of Care: [place] Prior Approval Number:    Date Approved/Denied: 06/18/16 PASRR Number:    Discharge Plan: SNF    Current Diagnoses: Patient Active Problem List   Diagnosis Date Noted  . S/P TKR (total knee replacement) using cement, right 06/16/2016  . Benign essential HTN 05/01/2016  . Preoperative clearance 05/01/2016  . DJD (degenerative joint disease) of knee 05/01/2016    Orientation RESPIRATION BLADDER Height & Weight     Self, Time, Situation, Place  Normal Continent Weight:   Height:     BEHAVIORAL SYMPTOMS/MOOD NEUROLOGICAL BOWEL NUTRITION STATUS      Continent  (Please see d/c summary)  AMBULATORY STATUS COMMUNICATION OF NEEDS Skin   Extensive Assist Verbally Surgical wounds (Closed incision right leg, compression wrap)                       Personal Care Assistance Level of Assistance  Bathing, Feeding, Dressing Bathing Assistance: Limited assistance Feeding assistance: Independent Dressing Assistance: Limited assistance     Functional Limitations Info  Sight, Hearing, Speech Sight Info: Adequate Hearing Info: Adequate Speech Info: Adequate    SPECIAL CARE FACTORS FREQUENCY  PT (By licensed PT), OT (By licensed OT)     PT Frequency: 7x week OT Frequency: 7x week            Contractures Contractures Info: Not present    Additional Factors Info  Code Status, Allergies Code Status Info: Full Code Allergies Info:  No known allergies           Current Medications (06/18/2016):  This is the current hospital active medication list Current Facility-Administered Medications  Medication Dose Route Frequency Provider Last Rate Last Dose  . 0.9 % NaCl with KCl 20 mEq/ L  infusion   Intravenous Continuous Benedetto Goad, PA-C      . acetaminophen (TYLENOL) tablet 650 mg  650 mg Oral Q6H PRN Benedetto Goad, PA-C       Or  . acetaminophen (TYLENOL) suppository 650 mg  650 mg Rectal Q6H PRN Benedetto Goad, PA-C      . alum & mag hydroxide-simeth (MAALOX/MYLANTA) 200-200-20 MG/5ML suspension 30 mL  30 mL Oral Q4H PRN Benedetto Goad, PA-C      . apixaban Arne [place]) tablet 2.5 mg  2.5 mg Oral Q12H Benedetto Goad, PA-C   2.5 mg at 06/18/16 0857  . bisacodyl (DULCOLAX) suppository 10 mg  10 mg Rectal Daily PRN Benedetto Goad, PA-C      . diphenhydrAMINE (BENADRYL) 12.5 MG/5ML elixir 12.5-25 mg  12.5-25 mg Oral Q4H PRN Benedetto Goad, PA-C      . docusate sodium (COLACE) capsule 100 mg  100 mg Oral BID Benedetto Goad, PA-C   100 mg at 06/18/16 0857  . hydrochlorothiazide (HYDRODIURIL) tablet 25 mg  25 mg Oral Daily Benedetto Goad, PA-C   25 mg at 06/18/16 0857  . HYDROcodone-acetaminophen (NORCO) 7.5-325  MG per tablet 1 tablet  1 tablet Oral Q6H Benedetto Goad, PA-C   1 tablet at 06/18/16 [phone]  . HYDROmorphone (DILAUDID) injection 0.5 mg  0.5 mg Intravenous Q2H PRN Benedetto Goad, PA-C      . magnesium citrate solution 1 Bottle  1 Bottle Oral Once PRN Benedetto Goad, PA-C      . menthol-cetylpyridinium (CEPACOL) lozenge 3 mg  1 lozenge Oral PRN Benedetto Goad, PA-C       Or  . phenol (CHLORASEPTIC) mouth spray 1 spray  1 spray Mouth/Throat PRN Benedetto Goad, PA-C      . metoCLOPramide (REGLAN) tablet 5-10 mg  5-10 mg Oral Q8H PRN Benedetto Goad, PA-C       Or  . metoCLOPramide (REGLAN) injection 5-10 mg  5-10 mg Intravenous Q8H PRN Benedetto Goad, PA-C      . ondansetron [facility]) tablet 4 mg  4 mg Oral Q6H PRN Benedetto Goad, PA-C       Or  . ondansetron [facility]) injection 4 mg  4 mg Intravenous Q6H PRN Benedetto Goad, PA-C      . polyethylene glycol (MIRALAX / GLYCOLAX) packet 17 g  17 g Oral Daily PRN Benedetto Goad, PA-C      . zolpidem (AMBIEN) tablet 5 mg  5 mg Oral QHS PRN Benedetto Goad, PA-C         Discharge Medications: Please see discharge summary for a list of discharge medications.  Relevant Imaging Results:  Relevant Lab Results:   Additional Information SSN: [id number]  Alla German, LCSW

## 2016-06-18 NOTE — Clinical Social Work Placement (Addendum)
   CLINICAL SOCIAL WORK PLACEMENT  NOTE  Date:  06/18/2016  Patient Details  Name: Mia Simpson MRN: [id number] Date of Birth: March 15, 1928  Clinical Social Work is seeking post-discharge placement for this patient at the [place] level of care (*CSW will initial, date and re-position this form in  chart as items are completed):      Patient/family provided with [facility] Work Department's list of facilities offering this level of care within the geographic area requested by the patient (or if unable, by the patient's family).  Yes   Patient/family informed of their freedom to choose among providers that offer the needed level of care, that participate in Medicare, Medicaid or managed care program needed by the patient, have an available bed and are willing to accept the patient.      Patient/family informed of [facility] [place]'s ownership interest in [facility] and [facility], as well as of the fact that they are under no obligation to receive care at these facilities.  PASRR submitted to EDS on       PASRR number received on 06/18/16     Existing PASRR number confirmed on       FL2 transmitted to all facilities in geographic area requested by pt/family on 06/18/16     FL2 transmitted to all facilities within larger geographic area on       Patient informed that his/her managed care company has contracts with or will negotiate with certain facilities, including the following:        No   Patient/family informed of bed offers received.  Patient chooses bed at [facility]     Physician recommends and patient chooses bed at      Patient to be transferred to [facility] on 06/20/16.  Patient to be transferred to facility by PT[facility]     Patient family notified on 06/20/16 of transfer.  Name of family member notified:        PHYSICIAN Please prepare priority discharge summary, including medications, Please prepare prescriptions, Please sign  FL2     Additional Comment:    _______________________________________________ Alla German, LCSW 06/18/2016, 12:51 PM

## 2016-06-19 LAB — BASIC METABOLIC PANEL
ANION GAP: 9 (ref 5–15)
BUN: 12 mg/dL (ref 6–20)
CALCIUM: 8.3 mg/dL — AB (ref 8.9–10.3)
CO2: 28 mmol/L (ref 22–32)
Chloride: 100 mmol/L — ABNORMAL LOW (ref 101–111)
Creatinine, Ser: 0.87 mg/dL (ref 0.44–1.00)
GFR, EST NON AFRICAN AMERICAN: 59 mL/min — AB (ref >60)
Glucose, Bld: 119 mg/dL — ABNORMAL HIGH (ref 65–99)
Potassium: 3 mmol/L — ABNORMAL LOW (ref 3.5–5.1)
SODIUM: 137 mmol/L (ref 135–145)

## 2016-06-19 LAB — URINE CULTURE: CULTURE: NO GROWTH

## 2016-06-19 LAB — CBC
HEMATOCRIT: 31.1 % — AB (ref 36.0–46.0)
Hemoglobin: 10.5 g/dL — ABNORMAL LOW (ref 12.0–15.0)
MCH: 30.4 pg (ref 26.0–34.0)
MCHC: 33.8 g/dL (ref 30.0–36.0)
MCV: 90.1 fL (ref 78.0–100.0)
PLATELETS: 130 10*3/uL — AB (ref 150–400)
RBC: 3.45 MIL/uL — ABNORMAL LOW (ref 3.87–5.11)
RDW: 15.7 % — AB (ref 11.5–15.5)
WBC: 9.7 10*3/uL (ref 4.0–10.5)

## 2016-06-19 MED ORDER — APIXABAN 2.5 MG PO TABS: 2.5000 mg | ORAL_TABLET | Freq: Two times a day (BID) | ORAL | 0 refills | Status: DC

## 2016-06-19 MED ORDER — HYDROCODONE-ACETAMINOPHEN 7.5-325 MG PO TABS: 1.0000 | ORAL_TABLET | Freq: Four times a day (QID) | ORAL | 0 refills | Status: DC | PRN

## 2016-06-19 NOTE — Discharge Instructions (Signed)
Information on my medicine - ELIQUIS (apixaban)  This medication education was reviewed with me or my healthcare representative as part of my discharge preparation.  The pharmacist that spoke with me during my hospital stay was:  Brain Hilts, [facility]  Why was Eliquis prescribed for you? Eliquis was prescribed for you to reduce the risk of blood clots forming after orthopedic surgery.    What do You need to know about Eliquis? Take your Eliquis TWICE DAILY - one tablet in the morning and one tablet in the evening with or without food.  It would be best to take the dose about the same time each day.  If you have difficulty swallowing the tablet whole please discuss with your pharmacist how to take the medication safely.  Take Eliquis exactly as prescribed by your doctor and DO NOT stop taking Eliquis without talking to the doctor who prescribed the medication.  Stopping without other medication to take the place of Eliquis may increase your risk of developing a clot.  After discharge, you should have regular check-up appointments with your healthcare provider that is prescribing your Eliquis.  What do you do if you miss a dose? If a dose of ELIQUIS is not taken at the scheduled time, take it as soon as possible on the same day and twice-daily administration should be resumed.  The dose should not be doubled to make up for a missed dose.  Do not take more than one tablet of ELIQUIS at the same time.  Important Safety Information A possible side effect of Eliquis is bleeding. You should call your healthcare provider right away if you experience any of the following: ? Bleeding from an injury or your nose that does not stop. ? Unusual colored urine (red or dark brown) or unusual colored stools (red or black). ? Unusual bruising for unknown reasons. ? A serious fall or if you hit your head (even if there is no bleeding).  Some medicines may interact with Eliquis and might  increase your risk of bleeding or clotting while on Eliquis. To help avoid this, consult your healthcare provider or pharmacist prior to using any new prescription or non-prescription medications, including herbals, vitamins, non-steroidal anti-inflammatory drugs (NSAIDs) and supplements.  This website has more information on Eliquis (apixaban): http://www.eliquis.com/eliquis/home  INSTRUCTIONS AFTER JOINT REPLACEMENT   o Remove items at home which could result in a fall. This includes throw rugs or furniture in walking pathways o ICE to the affected joint every three hours while awake for 30 minutes at a time, for at least the first 3-5 days, and then as needed for pain and swelling.  Continue to use ice for pain and swelling. You may notice swelling that will progress down to the foot and ankle.  This is normal after surgery.  Elevate your leg when you are not up walking on it.   o Continue to use the breathing machine you got in the hospital (incentive spirometer) which will help keep your temperature down.  It is common for your temperature to cycle up and down following surgery, especially at night when you are not up moving around and exerting yourself.  The breathing machine keeps your lungs expanded and your temperature down.   DIET:  As you were doing prior to hospitalization, we recommend a well-balanced diet.  DRESSING / WOUND CARE / SHOWERING  You may change your dressing 3-5 days after surgery.  Then change the dressing every day with sterile gauze.  Please use  good hand washing techniques before changing the dressing.  Do not use any lotions or creams on the incision until instructed by your surgeon.  ACTIVITY  o Increase activity slowly as tolerated, but follow the weight bearing instructions below.   o No driving for 6 weeks or until further direction given by your physician.  You cannot drive while taking narcotics.  o No lifting or carrying greater than 10 lbs. until further  directed by your surgeon. o Avoid periods of inactivity such as sitting longer than an hour when not asleep. This helps prevent blood clots.  o You may return to work once you are authorized by your doctor.     WEIGHT BEARING   Weight bearing as tolerated with assist device (walker, cane, etc) as directed, use it as long as suggested by your surgeon or therapist, typically at least 4-6 weeks.   EXERCISES  Results after joint replacement surgery are often greatly improved when you follow the exercise, range of motion and muscle strengthening exercises prescribed by your doctor. Safety measures are also important to protect the joint from further injury. Any time any of these exercises cause you to have increased pain or swelling, decrease what you are doing until you are comfortable again and then slowly increase them. If you have problems or questions, call your caregiver or physical therapist for advice.   Rehabilitation is important following a joint replacement. After just a few days of immobilization, the muscles of the leg can become weakened and shrink (atrophy).  These exercises are designed to build up the tone and strength of the thigh and leg muscles and to improve motion. Often times heat used for twenty to thirty minutes before working out will loosen up your tissues and help with improving the range of motion but do not use heat for the first two weeks following surgery (sometimes heat can increase post-operative swelling).   These exercises can be done on a training (exercise) mat, on the floor, on a table or on a bed. Use whatever works the best and is most comfortable for you.    Use music or television while you are exercising so that the exercises are a pleasant break in your day. This will make your life better with the exercises acting as a break in your routine that you can look forward to.   Perform all exercises about fifteen times, three times per day or as directed.  You  should exercise both the operative leg and the other leg as well.  Exercises include:    Quad Sets - Tighten up the muscle on the front of the thigh (Quad) and hold for 5-10 seconds.    Straight Leg Raises - With your knee straight (if you were given a brace, keep it on), lift the leg to 60 degrees, hold for 3 seconds, and slowly lower the leg.  Perform this exercise against resistance later as your leg gets stronger.   Leg Slides: Lying on your back, slowly slide your foot toward your buttocks, bending your knee up off the floor (only go as far as is comfortable). Then slowly slide your foot back down until your leg is flat on the floor again.   Angel Wings: Lying on your back spread your legs to the side as far apart as you can without causing discomfort.   Hamstring Strength:  Lying on your back, push your heel against the floor with your leg straight by tightening up the muscles of your buttocks.  Repeat, but this time bend your knee to a comfortable angle, and push your heel against the floor.  You may put a pillow under the heel to make it more comfortable if necessary.   A rehabilitation program following joint replacement surgery can speed recovery and prevent re-injury in the future due to weakened muscles. Contact your doctor or a physical therapist for more information on knee rehabilitation.    CONSTIPATION  Constipation is defined medically as fewer than three stools per week and severe constipation as less than one stool per week.  Even if you have a regular bowel pattern at home, your normal regimen is likely to be disrupted due to multiple reasons following surgery.  Combination of anesthesia, postoperative narcotics, change in appetite and fluid intake all can affect your bowels.   YOU MUST use at least one of the following options; they are listed in order of increasing strength to get the job done.  They are all available over the counter, and you may need to use some,  POSSIBLY even all of these options:    Drink plenty of fluids (prune juice may be helpful) and high fiber foods Colace 100 mg by mouth twice a day  Senokot for constipation as directed and as needed Dulcolax (bisacodyl), take with full glass of water  Miralax (polyethylene glycol) once or twice a day as needed.  If you have tried all these things and are unable to have a bowel movement in the first 3-4 days after surgery call either your surgeon or your primary doctor.    If you experience loose stools or diarrhea, hold the medications until you stool forms back up.  If your symptoms do not get better within 1 week or if they get worse, check with your doctor.  If you experience "the worst abdominal pain ever" or develop nausea or vomiting, please contact the office immediately for further recommendations for treatment.   ITCHING:  If you experience itching with your medications, try taking only a single pain pill, or even half a pain pill at a time.  You can also use Benadryl over the counter for itching or also to help with sleep.   TED HOSE STOCKINGS:  Use stockings on both legs until for at least 2 weeks or as directed by physician office. They may be removed at night for sleeping.  MEDICATIONS:  See your medication summary on the After Visit Summary that nursing will review with you.  You may have some home medications which will be placed on hold until you complete the course of blood thinner medication.  It is important for you to complete the blood thinner medication as prescribed.  PRECAUTIONS:  If you experience chest pain or shortness of breath - call 911 immediately for transfer to the hospital emergency department.   If you develop a fever greater that 101 F, purulent drainage from wound, increased redness or drainage from wound, foul odor from the wound/dressing, or calf pain - CONTACT YOUR SURGEON.                                                   FOLLOW-UP APPOINTMENTS:  If  you do not already have a post-op appointment, please call the office for an appointment to be seen by your surgeon.  Guidelines for how soon to be seen are  listed in your After Visit Summary, but are typically between 1-4 weeks after surgery.  OTHER INSTRUCTIONS:   Knee Replacement:  Do not place pillow under knee, focus on keeping the knee straight while resting. CPM instructions: 0-90 degrees, 2 hours in the morning, 2 hours in the afternoon, and 2 hours in the evening. Place foam block, curve side up under heel at all times except when in CPM or when walking.  DO NOT modify, tear, cut, or change the foam block in any way.  MAKE SURE YOU:   Understand these instructions.   Get help right away if you are not doing well or get worse.    Thank you for letting us be a part of your medical care team.  It is a privilege we respect greatly.  We hope these instructions will help you stay on track for a fast and full recovery!

## 2016-06-19 NOTE — Progress Notes (Signed)
Subjective: 3 Days Post-Op Procedure(s) (LRB): TOTAL KNEE ARTHROPLASTY (Right) Patient reports pain as mild and moderate.    Objective: Vital signs in last 24 hours: Temp:  [99.6 F (37.6 C)-100.9 F (38.3 C)] 99.8 F (37.7 C) (01/22 0615) Pulse Rate:  [99-113] 99 (01/22 0615) Resp:  [18] 18 (01/22 0615) BP: (120-135)/(59-62) 135/62 (01/22 0615) SpO2:  [95 %-100 %] 95 % (01/22 0615)  Intake/Output from previous day: 01/21 0701 - 01/22 0700 In: 1160 [P.O.:1160] Out: 1350 [Urine:1350] Intake/Output this shift: Total I/O In: -  Out: 400 [Urine:400]   Recent Labs  06/17/16 0417 06/18/16 0540 06/19/16 0434  HGB 11.1* 11.2* 10.5*    Recent Labs  06/18/16 0540 06/19/16 0434  WBC 11.2* 9.7  RBC 3.76* 3.45*  HCT 33.8* 31.1*  PLT 148* 130*    Recent Labs  06/18/16 0540 06/19/16 0434  NA 135 137  K 3.2* 3.0*  CL 98* 100*  CO2 27 28  BUN 11 12  CREATININE 0.87 0.87  GLUCOSE 132* 119*  CALCIUM 8.7* 8.3*   No results for input(s): LABPT, INR in the last 72 hours.  Sensation intact distally Intact pulses distally Incision: dressing C/D/I  Assessment/Plan: 3 Days Post-Op Procedure(s) (LRB): TOTAL KNEE ARTHROPLASTY (Right) Up with therapy WBAT RLE Eliquis dvt proph Incentive spirometer encourage use Cont abx possible UTI awaiting culture Cont d/c planning to SNF pending urine culture possibly tomorrow or wed.    Gavriela Cashin, Vonna Kotyk 06/19/2016, 1:16 PM

## 2016-06-19 NOTE — Discharge Summary (Signed)
PATIENT ID: Mia Simpson        MRN:  [id number]          DOB/AGE: Oct 19, 1927 / 83 y.o.    DISCHARGE SUMMARY  ADMISSION DATE:    06/16/2016 DISCHARGE DATE:   06/20/2016  ADMISSION DIAGNOSIS: OA RIGHT KNEE    DISCHARGE DIAGNOSIS:  OA RIGHT KNEE    ADDITIONAL DIAGNOSIS: Active Problems:   S/P TKR (total knee replacement) using cement, right ?UTI  Past Medical History:  Diagnosis Date  . Adenomatous colon polyp 2012  . Arthritis   . Benign essential HTN 05/01/2016  . Hypertension   . Obesity   . Osteoarthritis   . Senile purpura (HCC)     PROCEDURE: Procedure(s): TOTAL KNEE ARTHROPLASTY Right on 06/16/2016  CONSULTS: PT/OT/SW    HISTORY:  See H&P in chart  HOSPITAL COURSE:  Mia Simpson is a 83 y.o. admitted on 06/16/2016 and found to have a diagnosis of OA RIGHT KNEE.  After appropriate laboratory studies were obtained  they were taken to the operating room on 06/16/2016 and underwent  Procedure(s): TOTAL KNEE ARTHROPLASTY  Right.   They were given perioperative antibiotics:  Anti-infectives    Start     Dose/Rate Route Frequency Ordered Stop   06/18/16 1800  cefTRIAXone (ROCEPHIN) 1 g in dextrose 5 % 50 mL IVPB     1 g 100 mL/hr over 30 Minutes Intravenous Every 24 hours 06/18/16 1646     06/16/16 1800  ceFAZolin (ANCEF) IVPB 2g/100 mL premix     2 g 200 mL/hr over 30 Minutes Intravenous Every 6 hours 06/16/16 1619 06/17/16 0044   06/16/16 0552  ceFAZolin (ANCEF) IVPB 2g/100 mL premix     2 g 200 mL/hr over 30 Minutes Intravenous On call to O.R. 06/16/16 [id number] 06/16/16 0805    .  Tolerated the procedure well.    POD #1, allowed out of bed to a chair.  PT for ambulation and exercise program.   IV saline locked.  O2 discontionued.  POD #2, continued PT and ambulation, started on IV abx for possible UTI.  The remainder of the hospital course was dedicated to ambulation and strengthening.  Continued IV abx.  The patient was discharged on 4 days post op in  Stable  condition.  POD#4, urine cx negative for growth, fevers improved.  Blood products given:none  DIAGNOSTIC STUDIES: Recent vital signs:  Patient Vitals for the past 24 hrs:  BP Temp Temp src Pulse Resp SpO2  06/20/16 0422 113/71 98.7 F (37.1 C) Oral 85 18 97 %  06/19/16 2120 111/60 99.1 F (37.3 C) Oral 99 18 97 %  06/19/16 1327 112/69 99 F (37.2 C) Oral 95 18 99 %       Recent laboratory studies:  Recent Labs  06/17/16 0417 06/18/16 0540 06/19/16 0434  WBC 9.7 11.2* 9.7  HGB 11.1* 11.2* 10.5*  HCT 33.6* 33.8* 31.1*  PLT 152 148* 130*    Recent Labs  06/17/16 0417 06/18/16 0540 06/19/16 0434  NA 140 135 137  K 4.0 3.2* 3.0*  CL 106 98* 100*  CO2 26 27 28   BUN 18 11 12   CREATININE 0.81 0.87 0.87  GLUCOSE 145* 132* 119*  CALCIUM 8.6* 8.7* 8.3*   Lab Results  Component Value Date   INR 1.14 06/05/2016     Recent Radiographic Studies :  Dg Chest 2 View  Result Date: 06/05/2016 CLINICAL DATA:  Arthroplasty. EXAM: CHEST  2 VIEW COMPARISON:  No recent  prior . FINDINGS: Mediastinum hilar structures are normal. Lungs are clear. No pleural effusion or pneumothorax. Degenerative changes both shoulders and thoracic spine. IMPRESSION: No acute cardiopulmonary disease. Electronically Signed   By: Marcello Moores  Register   On: 06/05/2016 10:12    DISCHARGE INSTRUCTIONS:   DISCHARGE MEDICATIONS:   Allergies as of 06/20/2016   No Known Allergies     Medication List    STOP taking these medications   ibuprofen 800 MG tablet Commonly known as:  ADVIL,MOTRIN     TAKE these medications   apixaban 2.5 MG Tabs tablet Commonly known as:  ELIQUIS Take 1 tablet (2.5 mg total) by mouth every 12 (twelve) hours.   AZO CRANBERRY URINARY TRACT 250-60 MG Caps Generic drug:  Cranberry-Vitamin C Take 2 capsules by mouth daily.   CALCIUM 600 PO Take 2 tablets by mouth daily.   cholecalciferol 1000 units tablet Commonly known as:  VITAMIN D Take 1,000 Units by mouth daily.    Fish Oil 1200 MG Caps Take 2,400 mg by mouth daily.   hydrochlorothiazide 25 MG tablet Commonly known as:  HYDRODIURIL Take 25 mg by mouth daily.   HYDROcodone-acetaminophen 7.5-325 MG tablet Commonly known as:  NORCO Take 1 tablet by mouth every 6 (six) hours as needed for moderate pain.   MULTIPLE VITAMINS/WOMENS PO Take 1 tablet by mouth daily.            Durable Medical Equipment        Start     Ordered   06/16/16 1620  DME Walker rolling  Once    Question:  Patient needs a walker to treat with the following condition  Answer:  S/P TKR (total knee replacement), right   06/16/16 1619   06/16/16 1620  DME 3 n 1  Once     06/16/16 1619      FOLLOW UP VISIT:   Follow-up Information    CAFFREY JR,W D, MD. Schedule an appointment as soon as possible for a visit in 3 week(s).   Specialty:  Orthopedic Surgery Contact information: [place] [address] [phone]           DISPOSITION:   Skilled Nursing Facility/Rehab  CONDITION:  Stable   Chriss Czar, PA-C  06/20/2016 8:55 AM

## 2016-06-19 NOTE — Progress Notes (Signed)
Physical Therapy Treatment Patient Details Name: Quetzali Kreiling MRN: [id number] DOB: Nov 24, 1927 Today's Date: 06/19/2016    History of Present Illness Karia Iseman is an 83 y.o. female s/p R TKA. She has a PMH significant for benign essential HTN, arthritis, adenomatous colon polyp, hypertension, obesity, osteoarthritis, and senile purpura.    PT Comments    Patient declined mobility today.  Did agree to perform HEP today.  Limited today by pain and fatigue.  Will return tomorrow for PT session.  Follow Up Recommendations  SNF;Supervision/Assistance - 24 hour     Equipment Recommendations  None recommended by PT    Recommendations for Other Services       Precautions / Restrictions Precautions Precautions: Knee;Fall Precaution Booklet Issued: Yes (comment) Precaution Comments: Reviewed knee precautions Required Braces or Orthoses: Knee Immobilizer - Right Knee Immobilizer - Right: On except when in CPM Restrictions Weight Bearing Restrictions: Yes RLE Weight Bearing: Weight bearing as tolerated    Mobility  Bed Mobility               General bed mobility comments: Patient in chair  Transfers                 General transfer comment: Patient declined mobility.  Agreed to perform Ther Ex  Ambulation/Gait                 Stairs            Wheelchair Mobility    Modified Rankin (Stroke Patients Only)       Balance                                    Cognition Arousal/Alertness: Awake/alert Behavior During Therapy: WFL for tasks assessed/performed;Anxious Overall Cognitive Status: Within Functional Limits for tasks assessed                      Exercises Total Joint Exercises Ankle Circles/Pumps: AROM;Both;15 reps;Seated Quad Sets: AROM;Right;10 reps;Seated Short Arc Quad: AROM;AAROM;Right;10 reps;Seated Heel Slides: AROM;Right;10 reps;Seated (with towel under heel on chair to assist with sliding) Hip  ABduction/ADduction: AROM;Right;10 reps;Seated (with towel under calf to assist with sliding leg) Long Arc Quad: AROM;Right;5 reps;Seated;Limitations Long [employer] Limitations: Unable to extend knee through full range Knee Flexion: AROM;Right;5 reps;Seated Goniometric ROM: -10* extension; 65-70* flexion    General Comments        Pertinent Vitals/Pain Pain Assessment: 0-10 Pain Score: 7  Pain Location: Rt knee with motion Pain Descriptors / Indicators: Aching;Grimacing;Sore Pain Intervention(s): Limited activity within patient's tolerance;Monitored during session;Repositioned    Home Living                      Prior Function            PT Goals (current goals can now be found in the care plan section) Acute Rehab PT Goals Patient Stated Goal: go to church conference in a few months Progress towards PT goals: Progressing toward goals    Frequency    7X/week      PT Plan Current plan remains appropriate    Co-evaluation             End of Session   Activity Tolerance: Patient limited by pain Patient left: in chair;with call bell/phone within reach     Time: 1139-1205 PT Time Calculation (min) (ACUTE ONLY): 26 min  Charges:  $Therapeutic  Exercise: 23-37 mins                    G Codes:      Despina Pole 2016-07-13, 7:48 PM Carita Pian. Sanjuana Kava, [place] Pager [phone]

## 2016-06-19 NOTE — Clinical Social Work Note (Signed)
CSW visited with patient today regarding her discharge disposition. Was advised by patient that she had arranged to discharge to [facility] before coming to the hospital. Contact made with Angela Nevin at [facility] and confirmed that patient has a bed when medically stale.   A CSW will continue to follow and will facilitate discharge to [facility] when medically ready.  Aneesa Romey Givens, MSW, LCSW Licensed Clinical Social Worker [place] [phone]

## 2016-06-20 ENCOUNTER — Encounter (HOSPITAL_COMMUNITY): Payer: Self-pay | Admitting: Orthopedic Surgery

## 2016-06-20 DIAGNOSIS — M25569 Pain in unspecified knee: Secondary | ICD-10-CM | POA: Diagnosis not present

## 2016-06-20 DIAGNOSIS — M6281 Muscle weakness (generalized): Secondary | ICD-10-CM | POA: Diagnosis not present

## 2016-06-20 DIAGNOSIS — Z79899 Other long term (current) drug therapy: Secondary | ICD-10-CM | POA: Diagnosis not present

## 2016-06-20 DIAGNOSIS — Z471 Aftercare following joint replacement surgery: Secondary | ICD-10-CM | POA: Diagnosis not present

## 2016-06-20 DIAGNOSIS — E876 Hypokalemia: Secondary | ICD-10-CM | POA: Diagnosis not present

## 2016-06-20 DIAGNOSIS — D62 Acute posthemorrhagic anemia: Secondary | ICD-10-CM | POA: Diagnosis not present

## 2016-06-20 DIAGNOSIS — I10 Essential (primary) hypertension: Secondary | ICD-10-CM | POA: Diagnosis not present

## 2016-06-20 DIAGNOSIS — D692 Other nonthrombocytopenic purpura: Secondary | ICD-10-CM | POA: Diagnosis not present

## 2016-06-20 DIAGNOSIS — R531 Weakness: Secondary | ICD-10-CM | POA: Diagnosis not present

## 2016-06-20 DIAGNOSIS — R2681 Unsteadiness on feet: Secondary | ICD-10-CM | POA: Diagnosis not present

## 2016-06-20 DIAGNOSIS — E559 Vitamin D deficiency, unspecified: Secondary | ICD-10-CM | POA: Diagnosis not present

## 2016-06-20 DIAGNOSIS — D126 Benign neoplasm of colon, unspecified: Secondary | ICD-10-CM | POA: Diagnosis not present

## 2016-06-20 DIAGNOSIS — M1711 Unilateral primary osteoarthritis, right knee: Secondary | ICD-10-CM | POA: Diagnosis not present

## 2016-06-20 DIAGNOSIS — D696 Thrombocytopenia, unspecified: Secondary | ICD-10-CM | POA: Diagnosis not present

## 2016-06-20 DIAGNOSIS — Z96651 Presence of right artificial knee joint: Secondary | ICD-10-CM | POA: Diagnosis not present

## 2016-06-20 DIAGNOSIS — E669 Obesity, unspecified: Secondary | ICD-10-CM | POA: Diagnosis not present

## 2016-06-20 MED ORDER — APIXABAN 2.5 MG PO TABS: 2.5000 mg | ORAL_TABLET | Freq: Two times a day (BID) | ORAL | 0 refills | Status: DC

## 2016-06-20 MED ORDER — HYDROCODONE-ACETAMINOPHEN 7.5-325 MG PO TABS: 1.0000 | ORAL_TABLET | Freq: Four times a day (QID) | ORAL | 0 refills | Status: DC | PRN

## 2016-06-20 NOTE — Care Management Important Message (Signed)
Important Message  Patient Details  Name: Mia Simpson MRN: [id number] Date of Birth: 1927-11-08   Medicare Important Message Given:  Yes    Kihanna Kamiya 06/20/2016, 11:56 AM

## 2016-06-20 NOTE — Clinical Social Work Note (Signed)
Clinical Social Worker facilitated patient discharge including contacting patient family and facility to confirm patient discharge plans.  Clinical information faxed to facility and family agreeable with plan.  CSW arranged ambulance transport via [facility] to [facility]  RN to call [phone] for report prior to discharge. Patient will be going to [facility].  Clinical Social Worker will sign off for now as social work intervention is no longer needed. Please consult [place] again if new need arises.  [address] [place]

## 2016-06-20 NOTE — Progress Notes (Signed)
Subjective: 4 Days Post-Op Procedure(s) (LRB): TOTAL KNEE ARTHROPLASTY (Right) Patient reports pain as mild and moderate.    Objective: Vital signs in last 24 hours: Temp:  [98.7 F (37.1 C)-99.1 F (37.3 C)] 98.7 F (37.1 C) (01/23 0422) Pulse Rate:  [85-99] 85 (01/23 0422) Resp:  [18] 18 (01/23 0422) BP: (111-113)/(60-71) 113/71 (01/23 0422) SpO2:  [97 %-99 %] 97 % (01/23 0422)  Intake/Output from previous day: 01/22 0701 - 01/23 0700 In: 480 [P.O.:480] Out: 400 [Urine:400] Intake/Output this shift: Total I/O In: 200 [P.O.:200] Out: -    Recent Labs  06/18/16 0540 06/19/16 0434  HGB 11.2* 10.5*    Recent Labs  06/18/16 0540 06/19/16 0434  WBC 11.2* 9.7  RBC 3.76* 3.45*  HCT 33.8* 31.1*  PLT 148* 130*    Recent Labs  06/18/16 0540 06/19/16 0434  NA 135 137  K 3.2* 3.0*  CL 98* 100*  CO2 27 28  BUN 11 12  CREATININE 0.87 0.87  GLUCOSE 132* 119*  CALCIUM 8.7* 8.3*   No results for input(s): LABPT, INR in the last 72 hours.  Neurovascular intact Sensation intact distally Intact pulses distally Dorsiflexion/Plantar flexion intact Incision: dressing C/D/I  Assessment/Plan: 4 Days Post-Op Procedure(s) (LRB): TOTAL KNEE ARTHROPLASTY (Right) Up with therapy Discharge to SNF  wbat RLE eliquis dvt proph Pain control as ordered dsg change prn Will discharge today but needs to get up here with PT this AM   Mia Simpson 06/20/2016, 8:57 AM

## 2016-06-20 NOTE — Progress Notes (Signed)
Three attempts to call report to [facility] with no answer. Left voice mail with Admissions Coord. To return call to [phone] to receive report.

## 2016-06-20 NOTE — Progress Notes (Signed)
Orthopedic Tech Progress Note Patient Details:  Mia Simpson May 05, 1928 [id number]  Patient ID: Mia Simpson, female   DOB: [birth year], 83 y.o.   MRN: [id number] Pt will call when ready to get in cpm.   Mia Simpson 06/20/2016, 6:15 AM

## 2016-06-20 NOTE — Progress Notes (Signed)
Physical Therapy Treatment Patient Details Name: Mia Simpson MRN: [id number] DOB: 1927/12/11 Today's Date: 06/20/2016    History of Present Illness Mia Simpson is an 83 y.o. female s/p R TKA. She has a PMH significant for benign essential HTN, arthritis, adenomatous colon polyp, hypertension, obesity, osteoarthritis, and senile purpura.    PT Comments    Patient is making progress toward mobility goals and tolerated gait and therex well this session. Continue to progress as tolerated with anticipated d/c to SNF for further skilled PT services.    Follow Up Recommendations  SNF;Supervision/Assistance - 24 hour     Equipment Recommendations  None recommended by PT    Recommendations for Other Services       Precautions / Restrictions Precautions Precautions: Knee;Fall Precaution Booklet Issued: Yes (comment) Precaution Comments: Reviewed knee precautions Required Braces or Orthoses: Knee Immobilizer - Right Knee Immobilizer - Right: On except when in CPM Restrictions Weight Bearing Restrictions: Yes RLE Weight Bearing: Weight bearing as tolerated    Mobility  Bed Mobility               General bed mobility comments: Patient in chair  Transfers Overall transfer level: Needs assistance Equipment used: Rolling walker (2 wheeled) Transfers: Sit to/from Stand Sit to Stand: Min assist         General transfer comment: assist to power up into standing with cues for safe hand placement and technique  Ambulation/Gait Ambulation/Gait assistance: Min assist Ambulation Distance (Feet): 40 Feet Assistive device: Rolling walker (2 wheeled) Gait Pattern/deviations: Step-to pattern;Decreased stance time - right;Decreased step length - left;Decreased weight shift to right;Antalgic     General Gait Details: cues for sequencing, posture, and safe proximity of RW; pt with stept to pattern and decreased foot clearance on L side; cues for technique to increase L step  height   Stairs            Wheelchair Mobility    Modified Rankin (Stroke Patients Only)       Balance     Sitting balance-Leahy Scale: Fair       Standing balance-Leahy Scale: Poor Standing balance comment: Relies on RW for mobility                    Cognition Arousal/Alertness: Awake/alert Behavior During Therapy: WFL for tasks assessed/performed;Anxious Overall Cognitive Status: Within Functional Limits for tasks assessed                      Exercises Total Joint Exercises Quad Sets: AROM;Right;10 reps Heel Slides: Right;10 reps;AAROM Hip ABduction/ADduction: AROM;Right;10 reps Long Arc Quad: AROM;Right;Seated;10 reps Knee Flexion: AROM;Right;5 reps;Seated Goniometric ROM: 10-74    General Comments General comments (skin integrity, edema, etc.): family present in room during session      Pertinent Vitals/Pain Pain Assessment: Faces Faces Pain Scale: Hurts little more Pain Location: R knee  Pain Descriptors / Indicators: Aching;Sore;Guarding Pain Intervention(s): Limited activity within patient's tolerance;Monitored during session;Premedicated before session;Repositioned;Ice applied    Home Living                      Prior Function            PT Goals (current goals can now be found in the care plan section) Acute Rehab PT Goals Patient Stated Goal: go to church conference in a few months Progress towards PT goals: Progressing toward goals    Frequency    7X/week  PT Plan Current plan remains appropriate    Co-evaluation             End of Session Equipment Utilized During Treatment: Gait belt Activity Tolerance: Patient tolerated treatment well Patient left: in chair;with call bell/phone within reach;with family/visitor present     Time: [id number] PT Time Calculation (min) (ACUTE ONLY): 33 min  Charges:  $Gait Training: 8-22 mins $Therapeutic Exercise: 8-22 mins                    G Codes:       Salina April, PTA Pager: [phone]   06/20/2016, 2:09 PM

## 2016-06-20 NOTE — Plan of Care (Signed)
Problem: Bowel/Gastric: Goal: Will not experience complications related to bowel motility Outcome: Progressing Denies any gastric and bowel issues  Problem: Activity: Goal: Will remain free from falls Outcome: Progressing No fall or injury noted, safety precautions and fall preventions maintained  Problem: Physical Regulation: Goal: Postoperative complications will be avoided or minimized Outcome: Progressing No post op. Complications noted  Problem: Pain Management: Goal: Pain level will decrease with appropriate interventions Outcome: Progressing Medicated with schedule Norco with full relief, resting in the bed with eyes closed at this time

## 2016-06-20 NOTE — Progress Notes (Signed)
PTAR here to transport pt to [facility]. All belongings with pt. No distress noted.

## 2016-06-20 NOTE — Progress Notes (Signed)
RN has tried 2 more times to call [facility] to give report on this pt going to this facility today. No success in anyone answering the phone in any of the nursing neighborhoods. Msg left with admin coord .

## 2016-06-22 ENCOUNTER — Encounter: Payer: Self-pay | Admitting: Internal Medicine

## 2016-06-22 ENCOUNTER — Non-Acute Institutional Stay (SKILLED_NURSING_FACILITY): Payer: Medicare Other | Admitting: Internal Medicine

## 2016-06-22 DIAGNOSIS — D696 Thrombocytopenia, unspecified: Secondary | ICD-10-CM | POA: Diagnosis not present

## 2016-06-22 DIAGNOSIS — M1711 Unilateral primary osteoarthritis, right knee: Secondary | ICD-10-CM | POA: Diagnosis not present

## 2016-06-22 DIAGNOSIS — D62 Acute posthemorrhagic anemia: Secondary | ICD-10-CM | POA: Diagnosis not present

## 2016-06-22 DIAGNOSIS — E876 Hypokalemia: Secondary | ICD-10-CM

## 2016-06-22 DIAGNOSIS — I10 Essential (primary) hypertension: Secondary | ICD-10-CM | POA: Diagnosis not present

## 2016-06-22 DIAGNOSIS — R2681 Unsteadiness on feet: Secondary | ICD-10-CM | POA: Diagnosis not present

## 2016-06-22 NOTE — Progress Notes (Signed)
LOCATION: [facility]  PCP: Haywood Pao, MD   Code Status: Full Code  Goals of care: Advanced Directive information Advanced Directives 06/05/2016  Does Patient Have a Medical Advance Directive? Yes  Type of Paramedic of [place];Living will       Extended Emergency Contact Information Primary Emergency Contact: Holmes,Dorothy  [place] Phone: [phone] Relation: Niece   No Known Allergies  Chief Complaint  Patient presents with  . New Admit To SNF    New Admission Visit      HPI:  Patient is a 83 y.o. female seen today for short term rehabilitation post hospital admission from 06/16/2016-06/20/16 with right knee osteoarthritis. She underwent right total knee replacement. She is seen in her room today.  Review of Systems:  Constitutional: Negative for fever, chills, diaphoresis.  HENT: Negative for headache, congestion, nasal discharge, sore throat, difficulty swallowing.   Eyes: Negative for eye pain, blurred vision, double vision and discharge. Wears glasses. Respiratory: Negative for cough and wheezing. Positive for shortness of breath with exertion.   Cardiovascular: Negative for chest pain, palpitations, leg swelling.  Gastrointestinal: Negative for heartburn, nausea, vomiting, abdominal pain, loss of appetite. Last bowel movement was yesterday. Genitourinary: Negative for dysuria and flank pain.  Musculoskeletal: Negative for back pain, fall in the facility.  Skin: Negative for itching, rash.  Neurological: Negative for dizziness. Psychiatric/Behavioral: Negative for depression, anxiety, insomnia.   Past Medical History:  Diagnosis Date  . Adenomatous colon polyp 2012  . Arthritis   . Benign essential HTN 05/01/2016  . Hypertension   . Obesity   . Osteoarthritis   . Senile purpura ([place])    Past Surgical History:  Procedure Laterality Date  . CATARACT EXTRACTION, BILATERAL    . COLONOSCOPY W/  BIOPSIES  2012  . TOTAL ABDOMINAL HYSTERECTOMY W/ BILATERAL SALPINGOOPHORECTOMY  1975  . TOTAL KNEE ARTHROPLASTY Right 06/16/2016   Procedure: TOTAL KNEE ARTHROPLASTY;  Surgeon: Earlie Server, MD;  Location: [facility];  Service: Orthopedics;  Laterality: Right;   Social History:   reports that she has never smoked. She has never used smokeless tobacco. She reports that she does not drink alcohol or use drugs.  Family History  Problem Relation Age of Onset  . Gout Father   . Diabetes Sister   . Breast cancer Maternal Aunt   . Prostate cancer Son     Medications: Allergies as of 06/22/2016   No Known Allergies     Medication List       Accurate as of 06/22/16  2:29 PM. Always use your most recent med list.          apixaban 2.5 MG Tabs tablet Commonly known as:  ELIQUIS Take 1 tablet (2.5 mg total) by mouth every 12 (twelve) hours.   AZO CRANBERRY URINARY TRACT 250-60 MG Caps Generic drug:  Cranberry-Vitamin C Take 2 capsules by mouth daily.   CALCIUM 600 PO Take 2 tablets by mouth daily.   cholecalciferol 1000 units tablet Commonly known as:  VITAMIN D Take 1,000 Units by mouth daily.   Fish Oil 1200 MG Caps Take 2,400 mg by mouth daily.   hydrochlorothiazide 25 MG tablet Commonly known as:  HYDRODIURIL Take 25 mg by mouth daily.   HYDROcodone-acetaminophen 7.5-325 MG tablet Commonly known as:  NORCO Take 1 tablet by mouth every 6 (six) hours as needed for moderate pain.   MULTIPLE VITAMINS/WOMENS PO Take 1 tablet by mouth daily.  Immunizations: Immunization History  Administered Date(s) Administered  . PPD Test 06/20/2016     Physical Exam: Vitals:   06/22/16 1426  BP: 135/71  Pulse: 88  Resp: 19  Temp: 97.8 F (36.6 C)  TempSrc: Oral  SpO2: 95%  Weight: 244 lb 3.2 oz (110.8 kg)  Height: 5\' 10"  (1.778 m)   Body mass index is 35.04 kg/m.  General- elderly female, Obese, in no acute distress Head- normocephalic, atraumatic Nose- no  nasal discharge Throat- moist mucus membrane  Eyes- PERRLA, EOMI, no pallor, no icterus, no discharge, normal conjunctiva, normal sclera Neck- no cervical lymphadenopathy Cardiovascular- normal s1,s2, no murmur Respiratory- bilateral clear to auscultation, no wheeze, no rhonchi, no crackles, no use of accessory muscles Abdomen- bowel sounds present, soft, non tender, no guarding or rigidity, no CVA tenderness Musculoskeletal- able to move all 4 extremities, limited right knee range of motion, trace leg edema, TED hose present Neurological- alert and oriented to person, place and time Skin- warm and dry, right knee surgical incision with Aquacel dressing, dressing clean and dry Psychiatry- normal mood and affect    Labs reviewed: Basic Metabolic Panel:  Recent Labs  06/17/16 0417 06/18/16 0540 06/19/16 0434  NA 140 135 137  K 4.0 3.2* 3.0*  CL 106 98* 100*  CO2 26 27 28   GLUCOSE 145* 132* 119*  BUN 18 11 12   CREATININE 0.81 0.87 0.87  CALCIUM 8.6* 8.7* 8.3*   Liver Function Tests:  Recent Labs  06/05/16 0946  AST 28  ALT 20  ALKPHOS 56  BILITOT 0.4  PROT 7.8  ALBUMIN 4.3   No results for input(s): LIPASE, AMYLASE in the last 8760 hours. No results for input(s): AMMONIA in the last 8760 hours. CBC:  Recent Labs  06/05/16 0946 06/17/16 0417 06/18/16 0540 06/19/16 0434  WBC 4.9 9.7 11.2* 9.7  NEUTROABS 2.8  --   --   --   HGB 14.0 11.1* 11.2* 10.5*  HCT 42.8 33.6* 33.8* 31.1*  MCV 91.8 91.1 89.9 90.1  PLT 242 152 148* 130*   Cardiac Enzymes: No results for input(s): CKTOTAL, CKMB, CKMBINDEX, TROPONINI in the last 8760 hours. BNP: Invalid input(s): POCBNP CBG: No results for input(s): GLUCAP in the last 8760 hours.  Radiological Exams: Dg Chest 2 View  Result Date: 06/05/2016 CLINICAL DATA:  Arthroplasty. EXAM: CHEST  2 VIEW COMPARISON:  No recent prior . FINDINGS: Mediastinum hilar structures are normal. Lungs are clear. No pleural effusion or  pneumothorax. Degenerative changes both shoulders and thoracic spine. IMPRESSION: No acute cardiopulmonary disease. Electronically Signed   By: Marcello Moores  Register   On: 06/05/2016 10:12    Assessment/Plan  Unsteady gait With recent right knee surgery. Will need to work with physical therapy and occupational therapy to help with gait training and strengthening exercise.  Right knee osteoarthritis Status post right knee replacement surgery. To follow with orthopedic. Continue Norco 7. 5-325 milligrams 1 tablet every 6 hours as needed for pain. Continue to wear TED hose. Fall precautions to be taken. Will work with physical therapy and occupational therapy team to help restore her strength and gait. Continue eliquis 2.5 mg twice a day for DVT prophylaxis. Continue calcium and vitamin D supplement.  Blood loss anemia With recent surgery. Monitor H&H  Thrombocytopenia No bleed reported. Monitor platelet count.  Hypokalemia Check BMP  Hypertension Continue hydrochlorothiazide 25 mg daily, monitor blood pressure and BMP    Goals of care: short term rehabilitation   Labs/tests ordered: CBC, BMP 06/26/2016  Family/ staff Communication: reviewed care plan with patient and nursing supervisor    Blanchie Serve, MD Internal Medicine [place], [place] [address] Cell Phone (Monday-Friday 8 am - 5 pm): [phone] On Call: [phone] and follow prompts after 5 pm and on weekends Office Phone: [phone] Office Fax: [phone]

## 2016-06-28 ENCOUNTER — Non-Acute Institutional Stay (SKILLED_NURSING_FACILITY): Payer: Medicare Other | Admitting: Family

## 2016-06-28 DIAGNOSIS — E559 Vitamin D deficiency, unspecified: Secondary | ICD-10-CM | POA: Diagnosis not present

## 2016-06-28 DIAGNOSIS — R2681 Unsteadiness on feet: Secondary | ICD-10-CM | POA: Diagnosis not present

## 2016-06-28 DIAGNOSIS — Z96651 Presence of right artificial knee joint: Secondary | ICD-10-CM

## 2016-06-28 DIAGNOSIS — I10 Essential (primary) hypertension: Secondary | ICD-10-CM | POA: Diagnosis not present

## 2016-06-28 NOTE — Progress Notes (Signed)
Location:  [facility] Room Number: [id number]  Place of Service:  SNF [phone])  Provider: Marlowe Sax FNP-C   PCP: Haywood Pao, MD Patient Care Team: Haywood Pao, MD as PCP - General (Internal Medicine)  Extended Emergency Contact Information Primary Emergency Contact: Holmes,Dorothy  [place] Phone: [phone] Relation: Niece  Code Status:Full Code  Goals of care:  Advanced Directive information Advanced Directives 06/05/2016  Does Patient Have a Medical Advance Directive? Yes  Type of Paramedic of [place];Living will    No Known Allergies  Chief Complaint  Patient presents with  . Discharge Note    discharge home     HPI:  83 y.o. female seen today at [facility] for discharge home. She was here for short term rehabilitation post hospital admission from 06/16/2016-06/20/16 with right knee osteoarthritis. She underwent right total knee replacement. She has a medical history of HTN, OA, obesity among other conditions. She is seen in her room today.she denies any acute issues this visit. She states right knee pain under control with current pain regimen.She has an appointment with Ortho 06/30/2016. She has had unremarkable stay here in rehab. She has worked well with PT/OT now stable for discharge home.She will be discharged home with outpatient Therapy with Dr.Caffey to continue with ROM, Exercise, Gait stability and muscle strengthening. She will require DME FWW to allow her to maintain current level of independence with ADL's. DME will be arranged by facility social worker prior to discharge. Prescription medication will be written x 1 month then patient to follow up with PCP in 1-2 weeks. Facility staff report no new concerns.      Past Medical History:  Diagnosis Date  . Adenomatous colon polyp 2012  . Arthritis   . Benign essential HTN 05/01/2016  . Hypertension   . Obesity    . Osteoarthritis   . Senile purpura ([place])     Past Surgical History:  Procedure Laterality Date  . CATARACT EXTRACTION, BILATERAL    . COLONOSCOPY W/ BIOPSIES  2012  . TOTAL ABDOMINAL HYSTERECTOMY W/ BILATERAL SALPINGOOPHORECTOMY  1975  . TOTAL KNEE ARTHROPLASTY Right 06/16/2016   Procedure: TOTAL KNEE ARTHROPLASTY;  Surgeon: Earlie Server, MD;  Location: [facility];  Service: Orthopedics;  Laterality: Right;      reports that she has never smoked. She has never used smokeless tobacco. She reports that she does not drink alcohol or use drugs. Social History   Social History  . Marital status: Single    Spouse name: N/A  . Number of children: 2  . Years of education: N/A   Occupational History  . retired    Social History Main Topics  . Smoking status: Never Smoker  . Smokeless tobacco: Never Used  . Alcohol use No  . Drug use: No  . Sexual activity: Not on file   Other Topics Concern  . Not on file   Social History Narrative  . No narrative on file   No Known Allergies  Pertinent  Health Maintenance Due  Topic Date Due  . DEXA SCAN  10/17/1994  . PNA vac Low Risk Adult (1 of 2 - PCV13) 10/17/1994  . INFLUENZA VACCINE  12/28/2015    Medications: Allergies as of 06/28/2016   No Known Allergies     Medication List       Accurate as of 06/28/16 11:34 PM. Always use your most recent med list.  apixaban 2.5 MG Tabs tablet Commonly known as:  ELIQUIS Take 1 tablet (2.5 mg total) by mouth every 12 (twelve) hours.   AZO CRANBERRY URINARY TRACT 250-60 MG Caps Generic drug:  Cranberry-Vitamin C Take 2 capsules by mouth daily.   CALCIUM 600 PO Take 2 tablets by mouth daily.   cholecalciferol 1000 units tablet Commonly known as:  VITAMIN D Take 1,000 Units by mouth daily.   Fish Oil 1200 MG Caps Take 2,400 mg by mouth daily.   hydrochlorothiazide 25 MG tablet Commonly known as:  HYDRODIURIL Take 25 mg by mouth daily.   HYDROcodone-acetaminophen  7.5-325 MG tablet Commonly known as:  NORCO Take 1 tablet by mouth every 6 (six) hours as needed for moderate pain.   MULTIPLE VITAMINS/WOMENS PO Take 1 tablet by mouth daily.       Review of Systems  Constitutional: Negative for activity change, appetite change, chills, fatigue and fever.  HENT: Negative for congestion, rhinorrhea, sinus pain, sinus pressure, sneezing and sore throat.   Eyes: Negative.   Respiratory: Negative for cough, chest tightness, shortness of breath and wheezing.   Cardiovascular: Negative for chest pain, palpitations and leg swelling.  Gastrointestinal: Negative for abdominal distention, abdominal pain, constipation, diarrhea, nausea and vomiting.  Endocrine: Negative.   Genitourinary: Negative for dysuria, flank pain, frequency and urgency.  Musculoskeletal: Positive for gait problem.       Right knee pain under control with current regimen  Skin: Negative for color change, pallor and rash.  Neurological: Negative for dizziness, seizures, syncope, light-headedness and headaches.  Hematological: Does not bruise/bleed easily.  Psychiatric/Behavioral: Negative for agitation, confusion, hallucinations and sleep disturbance.    Vitals:   06/28/16 1145  BP: 106/68  Pulse: 88  Resp: 18  Temp: 98.2 F (36.8 C)  SpO2: 95%  Weight: 244 lb 3.2 oz (110.8 kg)  Height: 5\' 10"  (1.778 m)   Body mass index is 35.04 kg/m. Physical Exam  Constitutional: She is oriented to person, place, and time. She appears well-developed and well-nourished. No distress.  Peasant elderly.   HENT:  Head: Normocephalic.  Mouth/Throat: Oropharynx is clear and moist. No oropharyngeal exudate.  Eyes: Conjunctivae and EOM are normal. Pupils are equal, round, and reactive to light. Right eye exhibits no discharge. Left eye exhibits no discharge. No scleral icterus.  Neck: Normal range of motion. No JVD present. No thyromegaly present.  Cardiovascular: Normal rate, regular rhythm,  normal heart sounds and intact distal pulses.  Exam reveals no gallop and no friction rub.   No murmur heard. Pulmonary/Chest: Breath sounds normal. No respiratory distress. She has no wheezes. She has no rales.  Abdominal: Soft. Bowel sounds are normal. She exhibits no distension. There is no tenderness. There is no rebound and no guarding.  Musculoskeletal: She exhibits no edema, tenderness or deformity.  Moves x 4 extremities but guards right knee due to pain. Unsteady gait bilateral lower extremities ted hose in place.   Lymphadenopathy:    She has no cervical adenopathy.  Neurological: She is oriented to person, place, and time.  Skin: Skin is warm and dry. No rash noted. No erythema. No pallor.  Right knee surgical dressing dry, clean and intact. Surround skin tissue without any signs of infection.   Psychiatric: She has a normal mood and affect.    Labs reviewed: Basic Metabolic Panel:  Recent Labs  06/17/16 0417 06/18/16 0540 06/19/16 0434  NA 140 135 137  K 4.0 3.2* 3.0*  CL 106 98* 100*  CO2 26 27 28   GLUCOSE 145* 132* 119*  BUN 18 11 12   CREATININE 0.81 0.87 0.87  CALCIUM 8.6* 8.7* 8.3*   Liver Function Tests:  Recent Labs  06/05/16 0946  AST 28  ALT 20  ALKPHOS 56  BILITOT 0.4  PROT 7.8  ALBUMIN 4.3   CBC:  Recent Labs  06/05/16 0946 06/17/16 0417 06/18/16 0540 06/19/16 0434  WBC 4.9 9.7 11.2* 9.7  NEUTROABS 2.8  --   --   --   HGB 14.0 11.1* 11.2* 10.5*  HCT 42.8 33.6* 33.8* 31.1*  MCV 91.8 91.1 89.9 90.1  PLT 242 152 148* 130*     Assessment/Plan:   1. Unsteady gait Has worked well with PT/ OT. Will discharge home PT/OT to continue with ROM, Exercise, Gait stability and muscle strengthening. She will require  DME FWW to allow her to maintain current level of independence with ADL's. Fall and safety precautions.   2. Benign essential HTN B/p stable.Continue on Hydrochlorothiazide. BMP in 1-2 weeks with PCP   3. Vitamin D  deficiency Continue on current supplements. Monitor vit D level periodically.   4. Status post total right knee replacement Post short term rehabilitation post hospital admission from 06/16/2016-06/20/16 with right knee osteoarthritis. She underwent right total knee replacement. Continue current pain regimen wean off as tolerated. Continue on ELiQuis. Follow up with Ortho specialist as directed.   Patient is being discharged with the following home health services:   None. Patient request outpatient Therapy with Dr. Nicholes Stairs.    Patient is being discharged with the following durable medical equipment:   FWW to allow her to maintain current level of independence with ADL's.  Patient has been advised to f/u with their PCP in 1-2 weeks to for a transitions of care visit.Social services at their facility was responsible for arranging this appointment.Pt was provided with adequate prescriptions of noncontrolled medications to reach the scheduled appointment.For controlled substances, a limited supply was provided as appropriate for the individual patient.If the pt normally receives these medications from a pain clinic or has a contract with another physician, these medications should be received from that clinic or physician only).    Future labs/tests needed:  CBC, BMP in 1-2 weeks with PCP

## 2016-06-29 DIAGNOSIS — M1711 Unilateral primary osteoarthritis, right knee: Secondary | ICD-10-CM | POA: Diagnosis not present

## 2016-07-03 DIAGNOSIS — M25561 Pain in right knee: Secondary | ICD-10-CM | POA: Diagnosis not present

## 2016-07-03 DIAGNOSIS — M25661 Stiffness of right knee, not elsewhere classified: Secondary | ICD-10-CM | POA: Diagnosis not present

## 2016-07-03 DIAGNOSIS — R262 Difficulty in walking, not elsewhere classified: Secondary | ICD-10-CM | POA: Diagnosis not present

## 2016-07-03 DIAGNOSIS — M6281 Muscle weakness (generalized): Secondary | ICD-10-CM | POA: Diagnosis not present

## 2016-07-05 DIAGNOSIS — R262 Difficulty in walking, not elsewhere classified: Secondary | ICD-10-CM | POA: Diagnosis not present

## 2016-07-05 DIAGNOSIS — M25661 Stiffness of right knee, not elsewhere classified: Secondary | ICD-10-CM | POA: Diagnosis not present

## 2016-07-05 DIAGNOSIS — M6281 Muscle weakness (generalized): Secondary | ICD-10-CM | POA: Diagnosis not present

## 2016-07-05 DIAGNOSIS — M25561 Pain in right knee: Secondary | ICD-10-CM | POA: Diagnosis not present

## 2016-07-11 DIAGNOSIS — M6281 Muscle weakness (generalized): Secondary | ICD-10-CM | POA: Diagnosis not present

## 2016-07-11 DIAGNOSIS — M25561 Pain in right knee: Secondary | ICD-10-CM | POA: Diagnosis not present

## 2016-07-11 DIAGNOSIS — R262 Difficulty in walking, not elsewhere classified: Secondary | ICD-10-CM | POA: Diagnosis not present

## 2016-07-11 DIAGNOSIS — M25661 Stiffness of right knee, not elsewhere classified: Secondary | ICD-10-CM | POA: Diagnosis not present

## 2016-07-12 DIAGNOSIS — R262 Difficulty in walking, not elsewhere classified: Secondary | ICD-10-CM | POA: Diagnosis not present

## 2016-07-12 DIAGNOSIS — M25661 Stiffness of right knee, not elsewhere classified: Secondary | ICD-10-CM | POA: Diagnosis not present

## 2016-07-12 DIAGNOSIS — M6281 Muscle weakness (generalized): Secondary | ICD-10-CM | POA: Diagnosis not present

## 2016-07-12 DIAGNOSIS — M25561 Pain in right knee: Secondary | ICD-10-CM | POA: Diagnosis not present

## 2016-07-14 DIAGNOSIS — R262 Difficulty in walking, not elsewhere classified: Secondary | ICD-10-CM | POA: Diagnosis not present

## 2016-07-14 DIAGNOSIS — M25561 Pain in right knee: Secondary | ICD-10-CM | POA: Diagnosis not present

## 2016-07-14 DIAGNOSIS — M6281 Muscle weakness (generalized): Secondary | ICD-10-CM | POA: Diagnosis not present

## 2016-07-14 DIAGNOSIS — M25661 Stiffness of right knee, not elsewhere classified: Secondary | ICD-10-CM | POA: Diagnosis not present

## 2016-07-17 DIAGNOSIS — M6281 Muscle weakness (generalized): Secondary | ICD-10-CM | POA: Diagnosis not present

## 2016-07-17 DIAGNOSIS — M25561 Pain in right knee: Secondary | ICD-10-CM | POA: Diagnosis not present

## 2016-07-17 DIAGNOSIS — M25661 Stiffness of right knee, not elsewhere classified: Secondary | ICD-10-CM | POA: Diagnosis not present

## 2016-07-17 DIAGNOSIS — R262 Difficulty in walking, not elsewhere classified: Secondary | ICD-10-CM | POA: Diagnosis not present

## 2016-07-20 DIAGNOSIS — R262 Difficulty in walking, not elsewhere classified: Secondary | ICD-10-CM | POA: Diagnosis not present

## 2016-07-20 DIAGNOSIS — M25561 Pain in right knee: Secondary | ICD-10-CM | POA: Diagnosis not present

## 2016-07-20 DIAGNOSIS — M25661 Stiffness of right knee, not elsewhere classified: Secondary | ICD-10-CM | POA: Diagnosis not present

## 2016-07-20 DIAGNOSIS — M6281 Muscle weakness (generalized): Secondary | ICD-10-CM | POA: Diagnosis not present

## 2016-07-25 DIAGNOSIS — M25561 Pain in right knee: Secondary | ICD-10-CM | POA: Diagnosis not present

## 2016-07-25 DIAGNOSIS — R262 Difficulty in walking, not elsewhere classified: Secondary | ICD-10-CM | POA: Diagnosis not present

## 2016-07-25 DIAGNOSIS — M6281 Muscle weakness (generalized): Secondary | ICD-10-CM | POA: Diagnosis not present

## 2016-07-25 DIAGNOSIS — M25661 Stiffness of right knee, not elsewhere classified: Secondary | ICD-10-CM | POA: Diagnosis not present

## 2016-07-27 DIAGNOSIS — M25561 Pain in right knee: Secondary | ICD-10-CM | POA: Diagnosis not present

## 2016-08-01 DIAGNOSIS — M25561 Pain in right knee: Secondary | ICD-10-CM | POA: Diagnosis not present

## 2016-08-01 DIAGNOSIS — R262 Difficulty in walking, not elsewhere classified: Secondary | ICD-10-CM | POA: Diagnosis not present

## 2016-08-01 DIAGNOSIS — M6281 Muscle weakness (generalized): Secondary | ICD-10-CM | POA: Diagnosis not present

## 2016-08-01 DIAGNOSIS — M25661 Stiffness of right knee, not elsewhere classified: Secondary | ICD-10-CM | POA: Diagnosis not present

## 2016-08-03 DIAGNOSIS — M25661 Stiffness of right knee, not elsewhere classified: Secondary | ICD-10-CM | POA: Diagnosis not present

## 2016-08-03 DIAGNOSIS — R262 Difficulty in walking, not elsewhere classified: Secondary | ICD-10-CM | POA: Diagnosis not present

## 2016-08-03 DIAGNOSIS — M25561 Pain in right knee: Secondary | ICD-10-CM | POA: Diagnosis not present

## 2016-08-03 DIAGNOSIS — M6281 Muscle weakness (generalized): Secondary | ICD-10-CM | POA: Diagnosis not present

## 2016-08-10 DIAGNOSIS — M25561 Pain in right knee: Secondary | ICD-10-CM | POA: Diagnosis not present

## 2016-08-10 DIAGNOSIS — M6281 Muscle weakness (generalized): Secondary | ICD-10-CM | POA: Diagnosis not present

## 2016-08-10 DIAGNOSIS — R262 Difficulty in walking, not elsewhere classified: Secondary | ICD-10-CM | POA: Diagnosis not present

## 2016-08-10 DIAGNOSIS — M25661 Stiffness of right knee, not elsewhere classified: Secondary | ICD-10-CM | POA: Diagnosis not present

## 2016-08-15 DIAGNOSIS — M25661 Stiffness of right knee, not elsewhere classified: Secondary | ICD-10-CM | POA: Diagnosis not present

## 2016-08-15 DIAGNOSIS — M6281 Muscle weakness (generalized): Secondary | ICD-10-CM | POA: Diagnosis not present

## 2016-08-15 DIAGNOSIS — R262 Difficulty in walking, not elsewhere classified: Secondary | ICD-10-CM | POA: Diagnosis not present

## 2016-08-15 DIAGNOSIS — M25561 Pain in right knee: Secondary | ICD-10-CM | POA: Diagnosis not present

## 2016-08-17 DIAGNOSIS — M25561 Pain in right knee: Secondary | ICD-10-CM | POA: Diagnosis not present

## 2016-08-17 DIAGNOSIS — R262 Difficulty in walking, not elsewhere classified: Secondary | ICD-10-CM | POA: Diagnosis not present

## 2016-08-17 DIAGNOSIS — M6281 Muscle weakness (generalized): Secondary | ICD-10-CM | POA: Diagnosis not present

## 2016-08-17 DIAGNOSIS — M25661 Stiffness of right knee, not elsewhere classified: Secondary | ICD-10-CM | POA: Diagnosis not present

## 2016-08-21 DIAGNOSIS — R262 Difficulty in walking, not elsewhere classified: Secondary | ICD-10-CM | POA: Diagnosis not present

## 2016-08-21 DIAGNOSIS — M25661 Stiffness of right knee, not elsewhere classified: Secondary | ICD-10-CM | POA: Diagnosis not present

## 2016-08-21 DIAGNOSIS — M25561 Pain in right knee: Secondary | ICD-10-CM | POA: Diagnosis not present

## 2016-08-21 DIAGNOSIS — M6281 Muscle weakness (generalized): Secondary | ICD-10-CM | POA: Diagnosis not present

## 2016-08-23 DIAGNOSIS — M25561 Pain in right knee: Secondary | ICD-10-CM | POA: Diagnosis not present

## 2016-08-23 DIAGNOSIS — M25661 Stiffness of right knee, not elsewhere classified: Secondary | ICD-10-CM | POA: Diagnosis not present

## 2016-08-23 DIAGNOSIS — M6281 Muscle weakness (generalized): Secondary | ICD-10-CM | POA: Diagnosis not present

## 2016-08-23 DIAGNOSIS — R262 Difficulty in walking, not elsewhere classified: Secondary | ICD-10-CM | POA: Diagnosis not present

## 2016-08-29 DIAGNOSIS — M6281 Muscle weakness (generalized): Secondary | ICD-10-CM | POA: Diagnosis not present

## 2016-08-29 DIAGNOSIS — M25561 Pain in right knee: Secondary | ICD-10-CM | POA: Diagnosis not present

## 2016-08-29 DIAGNOSIS — M25661 Stiffness of right knee, not elsewhere classified: Secondary | ICD-10-CM | POA: Diagnosis not present

## 2016-08-29 DIAGNOSIS — R262 Difficulty in walking, not elsewhere classified: Secondary | ICD-10-CM | POA: Diagnosis not present

## 2016-08-30 DIAGNOSIS — M25661 Stiffness of right knee, not elsewhere classified: Secondary | ICD-10-CM | POA: Diagnosis not present

## 2016-08-30 DIAGNOSIS — M6281 Muscle weakness (generalized): Secondary | ICD-10-CM | POA: Diagnosis not present

## 2016-08-30 DIAGNOSIS — R262 Difficulty in walking, not elsewhere classified: Secondary | ICD-10-CM | POA: Diagnosis not present

## 2016-08-30 DIAGNOSIS — M25561 Pain in right knee: Secondary | ICD-10-CM | POA: Diagnosis not present

## 2016-08-31 DIAGNOSIS — M25661 Stiffness of right knee, not elsewhere classified: Secondary | ICD-10-CM | POA: Diagnosis not present

## 2016-08-31 DIAGNOSIS — M25561 Pain in right knee: Secondary | ICD-10-CM | POA: Diagnosis not present

## 2016-08-31 DIAGNOSIS — M6281 Muscle weakness (generalized): Secondary | ICD-10-CM | POA: Diagnosis not present

## 2016-08-31 DIAGNOSIS — R262 Difficulty in walking, not elsewhere classified: Secondary | ICD-10-CM | POA: Diagnosis not present

## 2016-09-05 DIAGNOSIS — M25561 Pain in right knee: Secondary | ICD-10-CM | POA: Diagnosis not present

## 2016-09-05 DIAGNOSIS — M25661 Stiffness of right knee, not elsewhere classified: Secondary | ICD-10-CM | POA: Diagnosis not present

## 2016-09-05 DIAGNOSIS — R262 Difficulty in walking, not elsewhere classified: Secondary | ICD-10-CM | POA: Diagnosis not present

## 2016-09-05 DIAGNOSIS — M6281 Muscle weakness (generalized): Secondary | ICD-10-CM | POA: Diagnosis not present

## 2016-09-07 DIAGNOSIS — M25561 Pain in right knee: Secondary | ICD-10-CM | POA: Diagnosis not present

## 2016-10-10 DIAGNOSIS — H6123 Impacted cerumen, bilateral: Secondary | ICD-10-CM | POA: Diagnosis not present

## 2016-12-07 DIAGNOSIS — M25561 Pain in right knee: Secondary | ICD-10-CM | POA: Diagnosis not present

## 2016-12-31 ENCOUNTER — Observation Stay (HOSPITAL_COMMUNITY): Payer: No Typology Code available for payment source

## 2016-12-31 ENCOUNTER — Emergency Department (HOSPITAL_COMMUNITY): Payer: No Typology Code available for payment source

## 2016-12-31 ENCOUNTER — Observation Stay (HOSPITAL_COMMUNITY)
Admission: EM | Admit: 2016-12-31 | Discharge: 2017-01-02 | Disposition: A | Discharge status: Home / Self Care | Payer: No Typology Code available for payment source | Attending: Surgery | Admitting: Surgery

## 2016-12-31 ENCOUNTER — Encounter (HOSPITAL_COMMUNITY): Payer: Self-pay | Admitting: Emergency Medicine

## 2016-12-31 DIAGNOSIS — S2220XA Unspecified fracture of sternum, initial encounter for closed fracture: Secondary | ICD-10-CM | POA: Diagnosis not present

## 2016-12-31 DIAGNOSIS — S8002XA Contusion of left knee, initial encounter: Secondary | ICD-10-CM | POA: Insufficient documentation

## 2016-12-31 DIAGNOSIS — R52 Pain, unspecified: Secondary | ICD-10-CM | POA: Diagnosis present

## 2016-12-31 DIAGNOSIS — Z79899 Other long term (current) drug therapy: Secondary | ICD-10-CM | POA: Insufficient documentation

## 2016-12-31 DIAGNOSIS — Y92410 Unspecified street and highway as the place of occurrence of the external cause: Secondary | ICD-10-CM | POA: Insufficient documentation

## 2016-12-31 DIAGNOSIS — M47814 Spondylosis without myelopathy or radiculopathy, thoracic region: Secondary | ICD-10-CM | POA: Diagnosis not present

## 2016-12-31 DIAGNOSIS — E669 Obesity, unspecified: Secondary | ICD-10-CM | POA: Insufficient documentation

## 2016-12-31 DIAGNOSIS — Z96651 Presence of right artificial knee joint: Secondary | ICD-10-CM | POA: Insufficient documentation

## 2016-12-31 DIAGNOSIS — S199XXA Unspecified injury of neck, initial encounter: Secondary | ICD-10-CM | POA: Diagnosis not present

## 2016-12-31 DIAGNOSIS — S0003XA Contusion of scalp, initial encounter: Secondary | ICD-10-CM | POA: Diagnosis not present

## 2016-12-31 DIAGNOSIS — D692 Other nonthrombocytopenic purpura: Secondary | ICD-10-CM | POA: Insufficient documentation

## 2016-12-31 DIAGNOSIS — Z7902 Long term (current) use of antithrombotics/antiplatelets: Secondary | ICD-10-CM | POA: Insufficient documentation

## 2016-12-31 DIAGNOSIS — R079 Chest pain, unspecified: Secondary | ICD-10-CM | POA: Diagnosis not present

## 2016-12-31 DIAGNOSIS — R0789 Other chest pain: Secondary | ICD-10-CM | POA: Diagnosis not present

## 2016-12-31 DIAGNOSIS — Z6834 Body mass index (BMI) 34.0-34.9, adult: Secondary | ICD-10-CM | POA: Diagnosis not present

## 2016-12-31 DIAGNOSIS — S2222XA Fracture of body of sternum, initial encounter for closed fracture: Principal | ICD-10-CM | POA: Insufficient documentation

## 2016-12-31 DIAGNOSIS — S8012XA Contusion of left lower leg, initial encounter: Secondary | ICD-10-CM | POA: Diagnosis not present

## 2016-12-31 DIAGNOSIS — I10 Essential (primary) hypertension: Secondary | ICD-10-CM | POA: Diagnosis not present

## 2016-12-31 DIAGNOSIS — S299XXA Unspecified injury of thorax, initial encounter: Secondary | ICD-10-CM | POA: Diagnosis not present

## 2016-12-31 DIAGNOSIS — I700 Atherosclerosis of aorta: Secondary | ICD-10-CM | POA: Diagnosis not present

## 2016-12-31 HISTORY — DX: Unspecified fracture of sternum, initial encounter for closed fracture: S22.20XA

## 2016-12-31 LAB — BASIC METABOLIC PANEL
Anion gap: 9 (ref 5–15)
BUN: 21 mg/dL — AB (ref 6–20)
CHLORIDE: 104 mmol/L (ref 101–111)
CO2: 27 mmol/L (ref 22–32)
Calcium: 9.2 mg/dL (ref 8.9–10.3)
Creatinine, Ser: 0.87 mg/dL (ref 0.44–1.00)
GFR calc Af Amer: >60 mL/min (ref >60)
GFR calc non Af Amer: 58 mL/min — ABNORMAL LOW (ref >60)
GLUCOSE: 119 mg/dL — AB (ref 65–99)
POTASSIUM: 3.7 mmol/L (ref 3.5–5.1)
Sodium: 140 mmol/L (ref 135–145)

## 2016-12-31 LAB — CREATININE, SERUM
Creatinine, Ser: 0.85 mg/dL (ref 0.44–1.00)
GFR, EST NON AFRICAN AMERICAN: 60 mL/min — AB (ref >60)

## 2016-12-31 LAB — CBC
HCT: 38.4 % (ref 36.0–46.0)
HEMATOCRIT: 38.7 % (ref 36.0–46.0)
Hemoglobin: 12.7 g/dL (ref 12.0–15.0)
Hemoglobin: 12.6 g/dL (ref 12.0–15.0)
MCH: 29.7 pg (ref 26.0–34.0)
MCH: 29.5 pg (ref 26.0–34.0)
MCHC: 32.8 g/dL (ref 30.0–36.0)
MCHC: 32.8 g/dL (ref 30.0–36.0)
MCV: 90.6 fL (ref 78.0–100.0)
MCV: 89.9 fL (ref 78.0–100.0)
PLATELETS: 183 10*3/uL (ref 150–400)
Platelets: 179 10*3/uL (ref 150–400)
RBC: 4.27 MIL/uL (ref 3.87–5.11)
RBC: 4.27 MIL/uL (ref 3.87–5.11)
RDW: 15.5 % (ref 11.5–15.5)
RDW: 15.1 % (ref 11.5–15.5)
WBC: 4.6 10*3/uL (ref 4.0–10.5)
WBC: 6.7 10*3/uL (ref 4.0–10.5)

## 2016-12-31 LAB — I-STAT TROPONIN, ED: Troponin i, poc: 0 ng/mL (ref 0.00–0.08)

## 2016-12-31 MED ORDER — VITAMIN D 1000 UNITS PO TABS
1000.0000 [IU] | ORAL_TABLET | Freq: Every day | ORAL | Status: DC
  Administered 2017-01-01 – 2017-01-02 (×2): 1000 [IU] via ORAL
  Filled 2016-12-31 (×2): qty 1

## 2016-12-31 MED ORDER — SODIUM CHLORIDE 0.9% FLUSH: 3.0000 mL | INTRAVENOUS | Status: DC | PRN

## 2016-12-31 MED ORDER — MORPHINE SULFATE (PF) 4 MG/ML IV SOLN: 1.0000 mg | INTRAVENOUS | Status: DC | PRN

## 2016-12-31 MED ORDER — ENOXAPARIN SODIUM 40 MG/0.4ML ~~LOC~~ SOLN
40.0000 mg | SUBCUTANEOUS | Status: DC
  Administered 2017-01-01 – 2017-01-02 (×2): 40 mg via SUBCUTANEOUS
  Filled 2016-12-31 (×2): qty 0.4

## 2016-12-31 MED ORDER — OXYCODONE HCL 5 MG PO TABS
5.0000 mg | ORAL_TABLET | ORAL | Status: DC | PRN
  Administered 2016-12-31 – 2017-01-02 (×5): 5 mg via ORAL
  Filled 2016-12-31 (×5): qty 1

## 2016-12-31 MED ORDER — HYDROCHLOROTHIAZIDE 25 MG PO TABS
25.0000 mg | ORAL_TABLET | Freq: Every day | ORAL | Status: DC
  Administered 2017-01-01 – 2017-01-02 (×2): 25 mg via ORAL
  Filled 2016-12-31 (×2): qty 1

## 2016-12-31 MED ORDER — ONDANSETRON HCL 4 MG/2ML IJ SOLN: 4.0000 mg | Freq: Four times a day (QID) | INTRAMUSCULAR | Status: DC | PRN

## 2016-12-31 MED ORDER — IOPAMIDOL (ISOVUE-300) INJECTION 61%
INTRAVENOUS | Status: AC
  Administered 2016-12-31: 100 mL via INTRAVENOUS
  Filled 2016-12-31: qty 100

## 2016-12-31 MED ORDER — ONDANSETRON 4 MG PO TBDP: 4.0000 mg | ORAL_TABLET | Freq: Four times a day (QID) | ORAL | Status: DC | PRN

## 2016-12-31 MED ORDER — OMEGA-3-ACID ETHYL ESTERS 1 G PO CAPS
2.0000 g | ORAL_CAPSULE | Freq: Every day | ORAL | Status: DC
  Administered 2017-01-01 – 2017-01-02 (×2): 2 g via ORAL
  Filled 2016-12-31 (×2): qty 2

## 2016-12-31 MED ORDER — FISH OIL 1200 MG PO CAPS: 2400.0000 mg | ORAL_CAPSULE | Freq: Every day | ORAL | Status: DC

## 2016-12-31 MED ORDER — SODIUM CHLORIDE 0.9 % IV SOLN
250.0000 mL | INTRAVENOUS | Status: DC | PRN
  Administered 2016-12-31: 250 mL via INTRAVENOUS

## 2016-12-31 MED ORDER — SODIUM CHLORIDE 0.9% FLUSH
3.0000 mL | Freq: Two times a day (BID) | INTRAVENOUS | Status: DC
  Administered 2016-12-31 – 2017-01-02 (×3): 3 mL via INTRAVENOUS

## 2016-12-31 MED ORDER — OXYCODONE-ACETAMINOPHEN 5-325 MG PO TABS
0.5000 | ORAL_TABLET | Freq: Once | ORAL | Status: AC
  Administered 2016-12-31: 0.5 via ORAL
  Filled 2016-12-31: qty 1
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
## 2016-12-31 NOTE — ED Notes (Signed)
Patient transported to X-ray 
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
## 2016-12-31 NOTE — ED Notes (Signed)
Pt returned from xray

## 2017-01-01 ENCOUNTER — Encounter (HOSPITAL_COMMUNITY): Payer: Self-pay | Admitting: General Practice

## 2017-01-01 DIAGNOSIS — S8012XA Contusion of left lower leg, initial encounter: Secondary | ICD-10-CM | POA: Diagnosis not present

## 2017-01-01 DIAGNOSIS — S2220XA Unspecified fracture of sternum, initial encounter for closed fracture: Secondary | ICD-10-CM | POA: Diagnosis not present

## 2017-01-01 MED ORDER — IBUPROFEN 400 MG PO TABS
400.0000 mg | ORAL_TABLET | Freq: Four times a day (QID) | ORAL | Status: DC | PRN
  Administered 2017-01-01 – 2017-01-02 (×3): 400 mg via ORAL
  Filled 2017-01-01 (×3): qty 1

## 2017-01-01 MED ORDER — ACETAMINOPHEN 325 MG PO TABS: 650.0000 mg | ORAL_TABLET | Freq: Four times a day (QID) | ORAL | Status: DC | PRN

## 2017-01-01 NOTE — Care Management Obs Status (Signed)
[facility] NOTIFICATION   Patient Details  Name: Mia Simpson MRN: [id number] Date of Birth: [birth year]   Medicare Observation Status Notification Given:  Yes    Ella Bodo, RN 01/01/2017, 2:17 PM
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
## 2017-01-02 DIAGNOSIS — S8012XA Contusion of left lower leg, initial encounter: Secondary | ICD-10-CM | POA: Diagnosis not present

## 2017-01-02 DIAGNOSIS — S2220XA Unspecified fracture of sternum, initial encounter for closed fracture: Secondary | ICD-10-CM | POA: Diagnosis not present

## 2017-01-02 MED ORDER — OXYCODONE HCL 5 MG PO TABS: 5.0000 mg | ORAL_TABLET | Freq: Four times a day (QID) | ORAL | 0 refills | Status: DC | PRN
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
## 2017-01-02 NOTE — Discharge Instructions (Signed)
Sternal Fracture A sternal fracture is a break in the bone in the center of your chest (sternum or breastbone). This fracture is not dangerous unless there is also an injury to your heart or lungs, which are protected by the sternum and ribs. What are the causes? This condition is usually caused by a forceful injury from:  Motor vehicle collisions. This is the most common cause.  Contact sports.  Physical assaults.  You can also have a sternal fracture without having a forceful injury if the bone becomes weakened over time (stress fracture or insufficiency fracture). What increases the risk? You may be at greater risk for a sternal fracture if you:  Participate in direct contact sports, such as football or wrestling.  Work at elevated heights, such as in Architect.  Other risk factors for a stress or insufficiency sternal fracture include:  Being female.  Being a postmenopausal woman.  Being age 37 or older.  Having osteoporosis.  Having severe curvature of the spine.  Being on long-term steroid treatment.  What are the signs or symptoms? Symptoms of this condition include:  Pain over the sternum.  Pain when pressing on the sternum.  Pain that gets worse with deep breathing or coughing.  Shortness of breath.  Bruising.  Swelling.  A crackling sound when taking a deep breath or pressing on the sternum.  How is this diagnosed? This condition is diagnosed with a medical history and physical exam. You may also have imaging tests, including:  CT scan.  Ultrasound.  Chest X-rays that are taken from a side view.  Your health care provider may check your blood oxygen level with a pulse oximetry test. You may also have repeated electrocardiograms (ECGs) to make sure that your heart has not been injured. You may also have a blood test to check for damage to your heart muscle. How is this treated? Treatment depends on the severity of your injury. A sternal  fracture without any other injury (isolated sternal fracture) usually heals without treatment. You may need to limit (restrict) some activities at home and take medicine for pain relief. In rare cases, you may need surgery to repair a sternal fracture that continues to cause severe pain or a sternal fracture that involves bones that have been moved out of position considerably (displaced fracture). Follow these instructions at home:  Take over-the-counter and prescription medicines only as told by your health care provider.  Rest at home. Return to your normal activities as told by your health care provider. Ask your health care provider what activities are safe for you.  If directed, apply ice to the injured area: ? Put ice in a plastic bag. ? Place a towel between your skin and the bag. ? Leave the ice on for 20 minutes, 2-3 times a day.  Do not lift anything that is heavier than 10 lb (4.5 kg) until your health care provider says it is safe.  Do not drive or operate heavy machinery while taking prescription pain medicine.  Do not use any tobacco products, such as cigarettes, chewing tobacco, and e-cigarettes. If you need help quitting, ask your health care provider.  Keep all follow-up visits as told by your health care provider. This is important. Contact a health care provider if:  Your pain medicine is not helping.  You continue to have pain after several weeks.  You develop a fever.  You develop a cough and you have thick or bloody mucus (sputum). Get help right away if:  You have difficulty breathing. °· You have chest pain. °· You have an abnormal heartbeat (palpitations). °· You feel nauseous or you have pain in your abdomen. °This information is not intended to replace advice given to you by your health care provider. Make sure you discuss any questions you have with your health care provider. °Document Released: 12/28/2003 Document Revised: 01/11/2016 Document Reviewed:  12/09/2014 °Elsevier Interactive Patient Education © 2018 [employer]. ° °
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
## 2017-01-03 DIAGNOSIS — E668 Other obesity: Secondary | ICD-10-CM | POA: Diagnosis not present

## 2017-01-03 DIAGNOSIS — S2220XD Unspecified fracture of sternum, subsequent encounter for fracture with routine healing: Secondary | ICD-10-CM | POA: Diagnosis not present

## 2017-01-03 DIAGNOSIS — I10 Essential (primary) hypertension: Secondary | ICD-10-CM | POA: Diagnosis not present

## 2017-01-03 DIAGNOSIS — Z96652 Presence of left artificial knee joint: Secondary | ICD-10-CM | POA: Diagnosis not present

## 2017-01-03 DIAGNOSIS — M1990 Unspecified osteoarthritis, unspecified site: Secondary | ICD-10-CM | POA: Diagnosis not present

## 2017-01-03 DIAGNOSIS — E669 Obesity, unspecified: Secondary | ICD-10-CM | POA: Diagnosis not present

## 2017-01-04 DIAGNOSIS — I10 Essential (primary) hypertension: Secondary | ICD-10-CM | POA: Diagnosis not present

## 2017-01-04 DIAGNOSIS — M1990 Unspecified osteoarthritis, unspecified site: Secondary | ICD-10-CM | POA: Diagnosis not present

## 2017-01-04 DIAGNOSIS — E669 Obesity, unspecified: Secondary | ICD-10-CM | POA: Diagnosis not present

## 2017-01-04 DIAGNOSIS — S2220XD Unspecified fracture of sternum, subsequent encounter for fracture with routine healing: Secondary | ICD-10-CM | POA: Diagnosis not present

## 2017-01-04 DIAGNOSIS — Z96652 Presence of left artificial knee joint: Secondary | ICD-10-CM | POA: Diagnosis not present

## 2017-01-09 DIAGNOSIS — Z96652 Presence of left artificial knee joint: Secondary | ICD-10-CM | POA: Diagnosis not present

## 2017-01-09 DIAGNOSIS — M1990 Unspecified osteoarthritis, unspecified site: Secondary | ICD-10-CM | POA: Diagnosis not present

## 2017-01-09 DIAGNOSIS — E669 Obesity, unspecified: Secondary | ICD-10-CM | POA: Diagnosis not present

## 2017-01-09 DIAGNOSIS — S2220XD Unspecified fracture of sternum, subsequent encounter for fracture with routine healing: Secondary | ICD-10-CM | POA: Diagnosis not present

## 2017-01-09 DIAGNOSIS — I10 Essential (primary) hypertension: Secondary | ICD-10-CM | POA: Diagnosis not present

## 2017-01-12 DIAGNOSIS — S2220XD Unspecified fracture of sternum, subsequent encounter for fracture with routine healing: Secondary | ICD-10-CM | POA: Diagnosis not present

## 2017-01-12 DIAGNOSIS — E669 Obesity, unspecified: Secondary | ICD-10-CM | POA: Diagnosis not present

## 2017-01-12 DIAGNOSIS — M1990 Unspecified osteoarthritis, unspecified site: Secondary | ICD-10-CM | POA: Diagnosis not present

## 2017-01-12 DIAGNOSIS — I10 Essential (primary) hypertension: Secondary | ICD-10-CM | POA: Diagnosis not present

## 2017-01-12 DIAGNOSIS — Z96652 Presence of left artificial knee joint: Secondary | ICD-10-CM | POA: Diagnosis not present

## 2017-01-15 DIAGNOSIS — N390 Urinary tract infection, site not specified: Secondary | ICD-10-CM | POA: Diagnosis not present

## 2017-01-15 DIAGNOSIS — I10 Essential (primary) hypertension: Secondary | ICD-10-CM | POA: Diagnosis not present

## 2017-01-15 DIAGNOSIS — R8299 Other abnormal findings in urine: Secondary | ICD-10-CM | POA: Diagnosis not present

## 2017-01-16 DIAGNOSIS — E669 Obesity, unspecified: Secondary | ICD-10-CM | POA: Diagnosis not present

## 2017-01-16 DIAGNOSIS — S2220XD Unspecified fracture of sternum, subsequent encounter for fracture with routine healing: Secondary | ICD-10-CM | POA: Diagnosis not present

## 2017-01-16 DIAGNOSIS — I10 Essential (primary) hypertension: Secondary | ICD-10-CM | POA: Diagnosis not present

## 2017-01-16 DIAGNOSIS — M1990 Unspecified osteoarthritis, unspecified site: Secondary | ICD-10-CM | POA: Diagnosis not present

## 2017-01-16 DIAGNOSIS — Z96652 Presence of left artificial knee joint: Secondary | ICD-10-CM | POA: Diagnosis not present

## 2017-01-18 DIAGNOSIS — Z96652 Presence of left artificial knee joint: Secondary | ICD-10-CM | POA: Diagnosis not present

## 2017-01-18 DIAGNOSIS — M1990 Unspecified osteoarthritis, unspecified site: Secondary | ICD-10-CM | POA: Diagnosis not present

## 2017-01-18 DIAGNOSIS — S2220XD Unspecified fracture of sternum, subsequent encounter for fracture with routine healing: Secondary | ICD-10-CM | POA: Diagnosis not present

## 2017-01-18 DIAGNOSIS — I10 Essential (primary) hypertension: Secondary | ICD-10-CM | POA: Diagnosis not present

## 2017-01-18 DIAGNOSIS — E669 Obesity, unspecified: Secondary | ICD-10-CM | POA: Diagnosis not present

## 2017-01-22 DIAGNOSIS — R2689 Other abnormalities of gait and mobility: Secondary | ICD-10-CM | POA: Diagnosis not present

## 2017-01-22 DIAGNOSIS — D692 Other nonthrombocytopenic purpura: Secondary | ICD-10-CM | POA: Diagnosis not present

## 2017-01-22 DIAGNOSIS — Z23 Encounter for immunization: Secondary | ICD-10-CM | POA: Diagnosis not present

## 2017-01-22 DIAGNOSIS — M179 Osteoarthritis of knee, unspecified: Secondary | ICD-10-CM | POA: Diagnosis not present

## 2017-01-22 DIAGNOSIS — D126 Benign neoplasm of colon, unspecified: Secondary | ICD-10-CM | POA: Diagnosis not present

## 2017-01-22 DIAGNOSIS — I10 Essential (primary) hypertension: Secondary | ICD-10-CM | POA: Diagnosis not present

## 2017-01-22 DIAGNOSIS — Z6835 Body mass index (BMI) 35.0-35.9, adult: Secondary | ICD-10-CM | POA: Diagnosis not present

## 2017-01-22 DIAGNOSIS — Z0000 Encounter for general adult medical examination without abnormal findings: Secondary | ICD-10-CM | POA: Diagnosis not present

## 2017-01-22 DIAGNOSIS — M545 Low back pain: Secondary | ICD-10-CM | POA: Diagnosis not present

## 2017-01-22 DIAGNOSIS — Z1389 Encounter for screening for other disorder: Secondary | ICD-10-CM | POA: Diagnosis not present

## 2017-01-23 DIAGNOSIS — M1990 Unspecified osteoarthritis, unspecified site: Secondary | ICD-10-CM | POA: Diagnosis not present

## 2017-01-23 DIAGNOSIS — Z96652 Presence of left artificial knee joint: Secondary | ICD-10-CM | POA: Diagnosis not present

## 2017-01-23 DIAGNOSIS — E669 Obesity, unspecified: Secondary | ICD-10-CM | POA: Diagnosis not present

## 2017-01-23 DIAGNOSIS — I10 Essential (primary) hypertension: Secondary | ICD-10-CM | POA: Diagnosis not present

## 2017-01-23 DIAGNOSIS — S2220XD Unspecified fracture of sternum, subsequent encounter for fracture with routine healing: Secondary | ICD-10-CM | POA: Diagnosis not present

## 2017-01-26 DIAGNOSIS — M1990 Unspecified osteoarthritis, unspecified site: Secondary | ICD-10-CM | POA: Diagnosis not present

## 2017-01-26 DIAGNOSIS — Z96652 Presence of left artificial knee joint: Secondary | ICD-10-CM | POA: Diagnosis not present

## 2017-01-26 DIAGNOSIS — E669 Obesity, unspecified: Secondary | ICD-10-CM | POA: Diagnosis not present

## 2017-01-26 DIAGNOSIS — I10 Essential (primary) hypertension: Secondary | ICD-10-CM | POA: Diagnosis not present

## 2017-01-26 DIAGNOSIS — S2220XD Unspecified fracture of sternum, subsequent encounter for fracture with routine healing: Secondary | ICD-10-CM | POA: Diagnosis not present

## 2017-01-29 DIAGNOSIS — S2220XD Unspecified fracture of sternum, subsequent encounter for fracture with routine healing: Secondary | ICD-10-CM | POA: Diagnosis not present

## 2017-01-29 DIAGNOSIS — I10 Essential (primary) hypertension: Secondary | ICD-10-CM | POA: Diagnosis not present

## 2017-01-29 DIAGNOSIS — M1990 Unspecified osteoarthritis, unspecified site: Secondary | ICD-10-CM | POA: Diagnosis not present

## 2017-01-29 DIAGNOSIS — Z96652 Presence of left artificial knee joint: Secondary | ICD-10-CM | POA: Diagnosis not present

## 2017-01-29 DIAGNOSIS — E669 Obesity, unspecified: Secondary | ICD-10-CM | POA: Diagnosis not present

## 2017-02-01 DIAGNOSIS — M1990 Unspecified osteoarthritis, unspecified site: Secondary | ICD-10-CM | POA: Diagnosis not present

## 2017-02-01 DIAGNOSIS — E669 Obesity, unspecified: Secondary | ICD-10-CM | POA: Diagnosis not present

## 2017-02-01 DIAGNOSIS — I10 Essential (primary) hypertension: Secondary | ICD-10-CM | POA: Diagnosis not present

## 2017-02-01 DIAGNOSIS — Z96652 Presence of left artificial knee joint: Secondary | ICD-10-CM | POA: Diagnosis not present

## 2017-02-01 DIAGNOSIS — S2220XD Unspecified fracture of sternum, subsequent encounter for fracture with routine healing: Secondary | ICD-10-CM | POA: Diagnosis not present

## 2017-02-20 DIAGNOSIS — M25561 Pain in right knee: Secondary | ICD-10-CM | POA: Diagnosis not present

## 2017-06-07 DIAGNOSIS — M25561 Pain in right knee: Secondary | ICD-10-CM | POA: Diagnosis not present

## 2017-07-09 DIAGNOSIS — M545 Low back pain: Secondary | ICD-10-CM | POA: Diagnosis not present

## 2017-07-09 DIAGNOSIS — D692 Other nonthrombocytopenic purpura: Secondary | ICD-10-CM | POA: Diagnosis not present

## 2017-07-09 DIAGNOSIS — I10 Essential (primary) hypertension: Secondary | ICD-10-CM | POA: Diagnosis not present

## 2017-07-09 DIAGNOSIS — M1712 Unilateral primary osteoarthritis, left knee: Secondary | ICD-10-CM | POA: Diagnosis not present

## 2017-07-09 DIAGNOSIS — L723 Sebaceous cyst: Secondary | ICD-10-CM | POA: Diagnosis not present

## 2017-07-09 DIAGNOSIS — D126 Benign neoplasm of colon, unspecified: Secondary | ICD-10-CM | POA: Diagnosis not present

## 2017-07-09 DIAGNOSIS — M1711 Unilateral primary osteoarthritis, right knee: Secondary | ICD-10-CM | POA: Diagnosis not present

## 2017-07-09 DIAGNOSIS — Z6835 Body mass index (BMI) 35.0-35.9, adult: Secondary | ICD-10-CM | POA: Diagnosis not present

## 2017-07-19 DIAGNOSIS — L720 Epidermal cyst: Secondary | ICD-10-CM | POA: Diagnosis not present

## 2017-07-26 DIAGNOSIS — L720 Epidermal cyst: Secondary | ICD-10-CM | POA: Diagnosis not present

## 2018-01-29 DIAGNOSIS — R82998 Other abnormal findings in urine: Secondary | ICD-10-CM | POA: Diagnosis not present

## 2018-01-29 DIAGNOSIS — I10 Essential (primary) hypertension: Secondary | ICD-10-CM | POA: Diagnosis not present

## 2018-02-04 DIAGNOSIS — M545 Low back pain: Secondary | ICD-10-CM | POA: Diagnosis not present

## 2018-02-04 DIAGNOSIS — I10 Essential (primary) hypertension: Secondary | ICD-10-CM | POA: Diagnosis not present

## 2018-02-04 DIAGNOSIS — Z23 Encounter for immunization: Secondary | ICD-10-CM | POA: Diagnosis not present

## 2018-02-04 DIAGNOSIS — D692 Other nonthrombocytopenic purpura: Secondary | ICD-10-CM | POA: Diagnosis not present

## 2018-02-04 DIAGNOSIS — M1711 Unilateral primary osteoarthritis, right knee: Secondary | ICD-10-CM | POA: Diagnosis not present

## 2018-02-04 DIAGNOSIS — Z1389 Encounter for screening for other disorder: Secondary | ICD-10-CM | POA: Diagnosis not present

## 2018-02-04 DIAGNOSIS — Z6835 Body mass index (BMI) 35.0-35.9, adult: Secondary | ICD-10-CM | POA: Diagnosis not present

## 2018-02-04 DIAGNOSIS — M1712 Unilateral primary osteoarthritis, left knee: Secondary | ICD-10-CM | POA: Diagnosis not present

## 2018-02-04 DIAGNOSIS — M7502 Adhesive capsulitis of left shoulder: Secondary | ICD-10-CM | POA: Diagnosis not present

## 2018-02-04 DIAGNOSIS — D126 Benign neoplasm of colon, unspecified: Secondary | ICD-10-CM | POA: Diagnosis not present

## 2018-02-04 DIAGNOSIS — Z0000 Encounter for general adult medical examination without abnormal findings: Secondary | ICD-10-CM | POA: Diagnosis not present

## 2018-02-04 DIAGNOSIS — R2689 Other abnormalities of gait and mobility: Secondary | ICD-10-CM | POA: Diagnosis not present

## 2018-02-21 DIAGNOSIS — M25512 Pain in left shoulder: Secondary | ICD-10-CM | POA: Diagnosis not present

## 2018-02-21 DIAGNOSIS — M7502 Adhesive capsulitis of left shoulder: Secondary | ICD-10-CM | POA: Diagnosis not present

## 2018-02-21 DIAGNOSIS — M6281 Muscle weakness (generalized): Secondary | ICD-10-CM | POA: Diagnosis not present

## 2018-02-21 DIAGNOSIS — M25612 Stiffness of left shoulder, not elsewhere classified: Secondary | ICD-10-CM | POA: Diagnosis not present

## 2018-02-26 DIAGNOSIS — M25512 Pain in left shoulder: Secondary | ICD-10-CM | POA: Diagnosis not present

## 2018-02-26 DIAGNOSIS — M25612 Stiffness of left shoulder, not elsewhere classified: Secondary | ICD-10-CM | POA: Diagnosis not present

## 2018-02-26 DIAGNOSIS — M6281 Muscle weakness (generalized): Secondary | ICD-10-CM | POA: Diagnosis not present

## 2018-02-26 DIAGNOSIS — M7502 Adhesive capsulitis of left shoulder: Secondary | ICD-10-CM | POA: Diagnosis not present

## 2018-02-28 DIAGNOSIS — M25512 Pain in left shoulder: Secondary | ICD-10-CM | POA: Diagnosis not present

## 2018-02-28 DIAGNOSIS — M6281 Muscle weakness (generalized): Secondary | ICD-10-CM | POA: Diagnosis not present

## 2018-02-28 DIAGNOSIS — M7502 Adhesive capsulitis of left shoulder: Secondary | ICD-10-CM | POA: Diagnosis not present

## 2018-02-28 DIAGNOSIS — M25612 Stiffness of left shoulder, not elsewhere classified: Secondary | ICD-10-CM | POA: Diagnosis not present

## 2018-03-05 DIAGNOSIS — M6281 Muscle weakness (generalized): Secondary | ICD-10-CM | POA: Diagnosis not present

## 2018-03-05 DIAGNOSIS — M25612 Stiffness of left shoulder, not elsewhere classified: Secondary | ICD-10-CM | POA: Diagnosis not present

## 2018-03-05 DIAGNOSIS — M7502 Adhesive capsulitis of left shoulder: Secondary | ICD-10-CM | POA: Diagnosis not present

## 2018-03-05 DIAGNOSIS — M25512 Pain in left shoulder: Secondary | ICD-10-CM | POA: Diagnosis not present

## 2018-03-07 DIAGNOSIS — M25612 Stiffness of left shoulder, not elsewhere classified: Secondary | ICD-10-CM | POA: Diagnosis not present

## 2018-03-07 DIAGNOSIS — M6281 Muscle weakness (generalized): Secondary | ICD-10-CM | POA: Diagnosis not present

## 2018-03-07 DIAGNOSIS — M25512 Pain in left shoulder: Secondary | ICD-10-CM | POA: Diagnosis not present

## 2018-03-07 DIAGNOSIS — M7502 Adhesive capsulitis of left shoulder: Secondary | ICD-10-CM | POA: Diagnosis not present

## 2018-03-12 DIAGNOSIS — M25512 Pain in left shoulder: Secondary | ICD-10-CM | POA: Diagnosis not present

## 2018-03-12 DIAGNOSIS — M25612 Stiffness of left shoulder, not elsewhere classified: Secondary | ICD-10-CM | POA: Diagnosis not present

## 2018-03-12 DIAGNOSIS — M7502 Adhesive capsulitis of left shoulder: Secondary | ICD-10-CM | POA: Diagnosis not present

## 2018-03-12 DIAGNOSIS — M6281 Muscle weakness (generalized): Secondary | ICD-10-CM | POA: Diagnosis not present

## 2018-03-14 DIAGNOSIS — M25612 Stiffness of left shoulder, not elsewhere classified: Secondary | ICD-10-CM | POA: Diagnosis not present

## 2018-03-14 DIAGNOSIS — M6281 Muscle weakness (generalized): Secondary | ICD-10-CM | POA: Diagnosis not present

## 2018-03-14 DIAGNOSIS — M7502 Adhesive capsulitis of left shoulder: Secondary | ICD-10-CM | POA: Diagnosis not present

## 2018-03-14 DIAGNOSIS — M25512 Pain in left shoulder: Secondary | ICD-10-CM | POA: Diagnosis not present

## 2018-03-19 DIAGNOSIS — M25612 Stiffness of left shoulder, not elsewhere classified: Secondary | ICD-10-CM | POA: Diagnosis not present

## 2018-03-19 DIAGNOSIS — M6281 Muscle weakness (generalized): Secondary | ICD-10-CM | POA: Diagnosis not present

## 2018-03-19 DIAGNOSIS — M25512 Pain in left shoulder: Secondary | ICD-10-CM | POA: Diagnosis not present

## 2018-03-19 DIAGNOSIS — M7502 Adhesive capsulitis of left shoulder: Secondary | ICD-10-CM | POA: Diagnosis not present

## 2018-03-21 DIAGNOSIS — M6281 Muscle weakness (generalized): Secondary | ICD-10-CM | POA: Diagnosis not present

## 2018-03-21 DIAGNOSIS — M25512 Pain in left shoulder: Secondary | ICD-10-CM | POA: Diagnosis not present

## 2018-03-21 DIAGNOSIS — M7502 Adhesive capsulitis of left shoulder: Secondary | ICD-10-CM | POA: Diagnosis not present

## 2018-03-21 DIAGNOSIS — M25612 Stiffness of left shoulder, not elsewhere classified: Secondary | ICD-10-CM | POA: Diagnosis not present

## 2018-03-22 DIAGNOSIS — H18413 Arcus senilis, bilateral: Secondary | ICD-10-CM | POA: Diagnosis not present

## 2018-03-22 DIAGNOSIS — Z961 Presence of intraocular lens: Secondary | ICD-10-CM | POA: Diagnosis not present

## 2018-03-22 DIAGNOSIS — H1045 Other chronic allergic conjunctivitis: Secondary | ICD-10-CM | POA: Diagnosis not present

## 2018-03-22 DIAGNOSIS — H5203 Hypermetropia, bilateral: Secondary | ICD-10-CM | POA: Diagnosis not present

## 2018-03-26 DIAGNOSIS — M6281 Muscle weakness (generalized): Secondary | ICD-10-CM | POA: Diagnosis not present

## 2018-03-26 DIAGNOSIS — M25612 Stiffness of left shoulder, not elsewhere classified: Secondary | ICD-10-CM | POA: Diagnosis not present

## 2018-03-26 DIAGNOSIS — M7502 Adhesive capsulitis of left shoulder: Secondary | ICD-10-CM | POA: Diagnosis not present

## 2018-03-26 DIAGNOSIS — M25512 Pain in left shoulder: Secondary | ICD-10-CM | POA: Diagnosis not present

## 2018-03-28 DIAGNOSIS — M7502 Adhesive capsulitis of left shoulder: Secondary | ICD-10-CM | POA: Diagnosis not present

## 2018-03-28 DIAGNOSIS — M25512 Pain in left shoulder: Secondary | ICD-10-CM | POA: Diagnosis not present

## 2018-03-28 DIAGNOSIS — M6281 Muscle weakness (generalized): Secondary | ICD-10-CM | POA: Diagnosis not present

## 2018-03-28 DIAGNOSIS — M25612 Stiffness of left shoulder, not elsewhere classified: Secondary | ICD-10-CM | POA: Diagnosis not present

## 2018-04-02 DIAGNOSIS — M25512 Pain in left shoulder: Secondary | ICD-10-CM | POA: Diagnosis not present

## 2018-04-02 DIAGNOSIS — M7502 Adhesive capsulitis of left shoulder: Secondary | ICD-10-CM | POA: Diagnosis not present

## 2018-04-02 DIAGNOSIS — M6281 Muscle weakness (generalized): Secondary | ICD-10-CM | POA: Diagnosis not present

## 2018-04-02 DIAGNOSIS — M25612 Stiffness of left shoulder, not elsewhere classified: Secondary | ICD-10-CM | POA: Diagnosis not present

## 2018-04-04 DIAGNOSIS — M25512 Pain in left shoulder: Secondary | ICD-10-CM | POA: Diagnosis not present

## 2018-04-04 DIAGNOSIS — M6281 Muscle weakness (generalized): Secondary | ICD-10-CM | POA: Diagnosis not present

## 2018-04-04 DIAGNOSIS — M7502 Adhesive capsulitis of left shoulder: Secondary | ICD-10-CM | POA: Diagnosis not present

## 2018-04-04 DIAGNOSIS — M25612 Stiffness of left shoulder, not elsewhere classified: Secondary | ICD-10-CM | POA: Diagnosis not present

## 2018-04-09 DIAGNOSIS — M6281 Muscle weakness (generalized): Secondary | ICD-10-CM | POA: Diagnosis not present

## 2018-04-09 DIAGNOSIS — M25612 Stiffness of left shoulder, not elsewhere classified: Secondary | ICD-10-CM | POA: Diagnosis not present

## 2018-04-09 DIAGNOSIS — M7502 Adhesive capsulitis of left shoulder: Secondary | ICD-10-CM | POA: Diagnosis not present

## 2018-04-09 DIAGNOSIS — M25512 Pain in left shoulder: Secondary | ICD-10-CM | POA: Diagnosis not present

## 2018-04-11 DIAGNOSIS — M7502 Adhesive capsulitis of left shoulder: Secondary | ICD-10-CM | POA: Diagnosis not present

## 2018-04-11 DIAGNOSIS — M25612 Stiffness of left shoulder, not elsewhere classified: Secondary | ICD-10-CM | POA: Diagnosis not present

## 2018-04-11 DIAGNOSIS — M6281 Muscle weakness (generalized): Secondary | ICD-10-CM | POA: Diagnosis not present

## 2018-04-11 DIAGNOSIS — M25512 Pain in left shoulder: Secondary | ICD-10-CM | POA: Diagnosis not present

## 2018-04-30 DIAGNOSIS — M25512 Pain in left shoulder: Secondary | ICD-10-CM | POA: Diagnosis not present

## 2018-04-30 DIAGNOSIS — M25612 Stiffness of left shoulder, not elsewhere classified: Secondary | ICD-10-CM | POA: Diagnosis not present

## 2018-04-30 DIAGNOSIS — M7502 Adhesive capsulitis of left shoulder: Secondary | ICD-10-CM | POA: Diagnosis not present

## 2018-04-30 DIAGNOSIS — M6281 Muscle weakness (generalized): Secondary | ICD-10-CM | POA: Diagnosis not present

## 2018-05-02 DIAGNOSIS — M25512 Pain in left shoulder: Secondary | ICD-10-CM | POA: Diagnosis not present

## 2018-05-02 DIAGNOSIS — M7502 Adhesive capsulitis of left shoulder: Secondary | ICD-10-CM | POA: Diagnosis not present

## 2018-05-02 DIAGNOSIS — M25612 Stiffness of left shoulder, not elsewhere classified: Secondary | ICD-10-CM | POA: Diagnosis not present

## 2018-05-02 DIAGNOSIS — M6281 Muscle weakness (generalized): Secondary | ICD-10-CM | POA: Diagnosis not present

## 2018-06-06 DIAGNOSIS — H900 Conductive hearing loss, bilateral: Secondary | ICD-10-CM | POA: Insufficient documentation

## 2018-06-17 DIAGNOSIS — Z6836 Body mass index (BMI) 36.0-36.9, adult: Secondary | ICD-10-CM | POA: Diagnosis not present

## 2018-06-17 DIAGNOSIS — R05 Cough: Secondary | ICD-10-CM | POA: Diagnosis not present

## 2018-06-17 DIAGNOSIS — J019 Acute sinusitis, unspecified: Secondary | ICD-10-CM | POA: Diagnosis not present

## 2018-08-05 DIAGNOSIS — M179 Osteoarthritis of knee, unspecified: Secondary | ICD-10-CM | POA: Diagnosis not present

## 2018-08-05 DIAGNOSIS — Z6836 Body mass index (BMI) 36.0-36.9, adult: Secondary | ICD-10-CM | POA: Diagnosis not present

## 2018-08-05 DIAGNOSIS — R2689 Other abnormalities of gait and mobility: Secondary | ICD-10-CM | POA: Diagnosis not present

## 2018-08-05 DIAGNOSIS — M545 Low back pain: Secondary | ICD-10-CM | POA: Diagnosis not present

## 2018-08-05 DIAGNOSIS — I10 Essential (primary) hypertension: Secondary | ICD-10-CM | POA: Diagnosis not present

## 2018-08-05 DIAGNOSIS — M7502 Adhesive capsulitis of left shoulder: Secondary | ICD-10-CM | POA: Diagnosis not present

## 2018-08-05 DIAGNOSIS — D692 Other nonthrombocytopenic purpura: Secondary | ICD-10-CM | POA: Diagnosis not present

## 2018-08-20 ENCOUNTER — Ambulatory Visit: Payer: Medicare Other | Admitting: Podiatry

## 2018-10-22 ENCOUNTER — Encounter: Payer: Self-pay | Admitting: Podiatry

## 2018-10-22 ENCOUNTER — Other Ambulatory Visit: Payer: Self-pay

## 2018-10-22 ENCOUNTER — Ambulatory Visit (INDEPENDENT_AMBULATORY_CARE_PROVIDER_SITE_OTHER): Payer: Medicare Other | Admitting: Podiatry

## 2018-10-22 VITALS — BP 147/91 | Temp 98.2°F

## 2018-10-22 DIAGNOSIS — L84 Corns and callosities: Secondary | ICD-10-CM | POA: Diagnosis not present

## 2018-10-22 DIAGNOSIS — M79675 Pain in left toe(s): Secondary | ICD-10-CM

## 2018-10-22 DIAGNOSIS — I739 Peripheral vascular disease, unspecified: Secondary | ICD-10-CM | POA: Diagnosis not present

## 2018-10-22 DIAGNOSIS — M79674 Pain in right toe(s): Secondary | ICD-10-CM

## 2018-10-22 DIAGNOSIS — B351 Tinea unguium: Secondary | ICD-10-CM

## 2018-10-22 DIAGNOSIS — I872 Venous insufficiency (chronic) (peripheral): Secondary | ICD-10-CM

## 2018-10-22 NOTE — Patient Instructions (Addendum)
Onychomycosis/Fungal Toenails  WHAT IS IT? An infection that lies within the keratin of your nail plate that is caused by a fungus.  WHY ME? Fungal infections affect all ages, sexes, races, and creeds.  There may be many factors that predispose you to a fungal infection such as age, coexisting medical conditions such as diabetes, or an autoimmune disease; stress, medications, fatigue, genetics, etc.  Bottom line: fungus thrives in a warm, moist environment and your shoes offer such a location.  IS IT CONTAGIOUS? Theoretically, yes.  You do not want to share shoes, nail clippers or files with someone who has fungal toenails.  Walking around barefoot in the same room or sleeping in the same bed is unlikely to transfer the organism.  It is important to realize, however, that fungus can spread easily from one nail to the next on the same foot.  HOW DO WE TREAT THIS?  There are several ways to treat this condition.  Treatment may depend on many factors such as age, medications, pregnancy, liver and kidney conditions, etc.  It is best to ask your doctor which options are available to you.  1. No treatment.   Unlike many other medical concerns, you can live with this condition.  However for many people this can be a painful condition and may lead to ingrown toenails or a bacterial infection.  It is recommended that you keep the nails cut short to help reduce the amount of fungal nail. 2. Topical treatment.  These range from herbal remedies to prescription strength nail lacquers.  About 40-50% effective, topicals require twice daily application for approximately 9 to 12 months or until an entirely new nail has grown out.  The most effective topicals are medical grade medications available through physicians offices. 3. Oral antifungal medications.  With an 80-90% cure rate, the most common oral medication requires 3 to 4 months of therapy and stays in your system for a year as the new nail grows out.  Oral  antifungal medications do require blood work to make sure it is a safe drug for you.  A liver function panel will be performed prior to starting the medication and after the first month of treatment.  It is important to have the blood work performed to avoid any harmful side effects.  In general, this medication safe but blood work is required. 4. Laser Therapy.  This treatment is performed by applying a specialized laser to the affected nail plate.  This therapy is noninvasive, fast, and non-painful.  It is not covered by insurance and is therefore, out of pocket.  The results have been very good with a 80-95% cure rate.  The [facility] is the only practice in the area to offer this therapy. 5. Permanent Nail Avulsion.  Removing the entire nail so that a new nail will not grow back.  Hammer Toe  Hammer toe is a change in the shape (a deformity) of your toe. The deformity causes the middle joint of your toe to stay bent. This causes pain, especially when you are wearing shoes. Hammer toe starts gradually. At first, the toe can be straightened. Gradually over time, the deformity becomes stiff and permanent. Early treatments to keep the toe straight may relieve pain. As the deformity becomes stiff and permanent, surgery may be needed to straighten the toe. What are the causes? Hammer toe is caused by abnormal bending of the toe joint that is closest to your foot. It happens gradually over time. This  pulls on the muscles and connections (tendons) of the toe joint, making them weak and stiff. It is often related to wearing shoes that are too short or narrow and do not let your toes straighten. What increases the risk? You may be at greater risk for hammer toe if you:  Are female.  Are older.  Wear shoes that are too small.  Wear high-heeled shoes that pinch your toes.  Are a Engineer, mining.  Have a second toe that is longer than your big toe (first toe).  Injure your foot or toe.  Have  arthritis.  Have a family history of hammer toe.  Have a nerve or muscle disorder. What are the signs or symptoms? The main symptoms of this condition are pain and deformity of the toe. The pain is worse when wearing shoes, walking, or running. Other symptoms may include:  Corns or calluses over the bent part of the toe or between the toes.  Redness and a burning feeling on the toe.  An open sore that forms on the top of the toe.  Not being able to straighten the toe. How is this diagnosed? This condition is diagnosed based on your symptoms and a physical exam. During the exam, your health care provider will try to straighten your toe to see how stiff the deformity is. You may also have tests, such as:  A blood test to check for rheumatoid arthritis.  An X-ray to show how severe the deformity is. How is this treated? Treatment for this condition will depend on how stiff the deformity is. Surgery is often needed. However, sometimes a hammer toe can be straightened without surgery. Treatments that do not involve surgery include:  Taping the toe into a straightened position.  Using pads and cushions to protect the toe (orthotics).  Wearing shoes that provide enough room for the toes.  Doing toe-stretching exercises at home.  Taking an NSAID to reduce pain and swelling. If these treatments do not help or the toe cannot be straightened, surgery is the next option. The most common surgeries used to straighten a hammer toe include:  Arthroplasty. In this procedure, part of the joint is removed, and that allows the toe to straighten.  Fusion. In this procedure, cartilage between the two bones of the joint is taken out and the bones are fused together into one longer bone.  Implantation. In this procedure, part of the bone is removed and replaced with an implant to let the toe move again.  Flexor tendon transfer. In this procedure, the tendons that curl the toes down (flexor tendons)  are repositioned. Follow these instructions at home:  Take over-the-counter and prescription medicines only as told by your health care provider.  Do toe straightening and stretching exercises as told by your health care provider.  Keep all follow-up visits as told by your health care provider. This is important. How is this prevented?  Wear shoes that give your toes enough room and do not cause pain.  Do not wear high-heeled shoes. Contact a health care provider if:  Your pain gets worse.  Your toe becomes red or swollen.  You develop an open sore on your toe. This information is not intended to replace advice given to you by your health care provider. Make sure you discuss any questions you have with your health care provider. Document Released: 05/12/2000 Document Revised: 12/11/2016 Document Reviewed: 09/08/2015 Elsevier Interactive Patient Education  2019 [employer] are small  areas of thickened skin that occur on the top, sides, or tip of a toe. They contain a cone-shaped core with a point that can press on a nerve below. This causes pain.  Calluses are areas of thickened skin that can occur anywhere on the body, including the hands, fingers, palms, soles of the feet, and heels. Calluses are usually larger than corns. What are the causes? Corns and calluses are caused by rubbing (friction) or pressure, such as from shoes that are too tight or do not fit properly. What increases the risk? Corns are more likely to develop in people who have misshapen toes (toe deformities), such as hammer toes. Calluses can occur with friction to any area of the skin. They are more likely to develop in people who:  Work with their hands.  Wear shoes that fit poorly, are too tight, or are high-heeled.  Have toe deformities. What are the signs or symptoms? Symptoms of a corn or callus include:  A hard growth on the skin.  Pain or tenderness under the  skin.  Redness and swelling.  Increased discomfort while wearing tight-fitting shoes, if your feet are affected. If a corn or callus becomes infected, symptoms may include:  Redness and swelling that gets worse.  Pain.  Fluid, blood, or pus draining from the corn or callus. How is this diagnosed? Corns and calluses may be diagnosed based on your symptoms, your medical history, and a physical exam. How is this treated? Treatment for corns and calluses may include:  Removing the cause of the friction or pressure. This may involve: ? Changing your shoes. ? Wearing shoe inserts (orthotics) or other protective layers in your shoes, such as a corn pad. ? Wearing gloves.  Applying medicine to the skin (topical medicine) to help soften skin in the hardened, thickened areas.  Removing layers of dead skin with a file to reduce the size of the corn or callus.  Removing the corn or callus with a scalpel or laser.  Taking antibiotic medicines, if your corn or callus is infected.  Having surgery, if a toe deformity is the cause. Follow these instructions at home:   Take over-the-counter and prescription medicines only as told by your health care provider.  If you were prescribed an antibiotic, take it as told by your health care provider. Do not stop taking it even if your condition starts to improve.  Wear shoes that fit well. Avoid wearing high-heeled shoes and shoes that are too tight or too loose.  Wear any padding, protective layers, gloves, or orthotics as told by your health care provider.  Soak your hands or feet and then use a file or pumice stone to soften your corn or callus. Do this as told by your health care provider.  Check your corn or callus every day for symptoms of infection. Contact a health care provider if you:  Notice that your symptoms do not improve with treatment.  Have redness or swelling that gets worse.  Notice that your corn or callus becomes  painful.  Have fluid, blood, or pus coming from your corn or callus.  Have new symptoms. Summary  Corns are small areas of thickened skin that occur on the top, sides, or tip of a toe.  Calluses are areas of thickened skin that can occur anywhere on the body, including the hands, fingers, palms, and soles of the feet. Calluses are usually larger than corns.  Corns and calluses are caused by rubbing (friction) or pressure, such  as from shoes that are too tight or do not fit properly.  Treatment may include wearing any padding, protective layers, gloves, or orthotics as told by your health care provider. This information is not intended to replace advice given to you by your health care provider. Make sure you discuss any questions you have with your health care provider. Document Released: 02/19/2004 Document Revised: 03/28/2017 Document Reviewed: 03/28/2017 Elsevier Interactive Patient Education  2019 [employer].
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
## 2018-10-30 ENCOUNTER — Ambulatory Visit: Payer: Medicare Other | Admitting: Family Medicine

## 2018-11-04 ENCOUNTER — Ambulatory Visit (INDEPENDENT_AMBULATORY_CARE_PROVIDER_SITE_OTHER): Payer: Medicare Other | Admitting: Family Medicine

## 2018-11-04 ENCOUNTER — Other Ambulatory Visit: Payer: Self-pay

## 2018-11-04 ENCOUNTER — Encounter: Payer: Self-pay | Admitting: Family Medicine

## 2018-11-04 DIAGNOSIS — R600 Localized edema: Secondary | ICD-10-CM | POA: Insufficient documentation

## 2018-11-04 DIAGNOSIS — Z6834 Body mass index (BMI) 34.0-34.9, adult: Secondary | ICD-10-CM | POA: Diagnosis not present

## 2018-11-04 DIAGNOSIS — E669 Obesity, unspecified: Secondary | ICD-10-CM | POA: Diagnosis not present

## 2018-11-04 DIAGNOSIS — I10 Essential (primary) hypertension: Secondary | ICD-10-CM | POA: Diagnosis not present

## 2018-11-04 DIAGNOSIS — E559 Vitamin D deficiency, unspecified: Secondary | ICD-10-CM | POA: Diagnosis not present

## 2018-11-04 NOTE — Assessment & Plan Note (Signed)
Continue vitamin D supplementation, 5000 units daily. We will plan on checking 25 OH vitamin D next visit.

## 2018-11-04 NOTE — Assessment & Plan Note (Signed)
Continue following a healthful diet. Recommend engaging in low impact physical activity, stationary back with seat is appropriate.  Recommend starting a few minutes at a time and advance as tolerated.

## 2018-11-04 NOTE — Patient Instructions (Addendum)
A few things to remember from today's visit:   Bilateral lower extremity edema  Continue fluid pill, eat a banana when taking med. Fall precautions.   Please be sure medication list is accurate. If a new problem present, please set up appointment sooner than planned today.

## 2018-11-04 NOTE — Assessment & Plan Note (Signed)
Problem seems to be stable. Continue HCTZ 25 mg daily as needed. We discussed some side effects of diuretics. Recommend high potassium diet when taking HCTZ.
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
## 2018-11-04 NOTE — Assessment & Plan Note (Signed)
Today BP adequately controlled. For now continue nonpharmacologic treatment. Continue monitoring BP at home. Continue low-salt diet. Follow-up in 6 months.

## 2019-01-01 ENCOUNTER — Telehealth: Payer: Self-pay | Admitting: Family Medicine

## 2019-01-01 NOTE — Telephone Encounter (Signed)
Pt called in to be advised. Pt would like to bring in a urine. Pt says that she made a mistake and used what she thought was bleach on her below. Pt says that she broke out below. Pt would like to bring in a urine sample to make sure that she doesn't have a infection.   Pt would like further assistance and discuss this further.    CB: [phone]

## 2019-01-01 NOTE — Telephone Encounter (Signed)
Message sent to Dr. Jordan for review. Please advise 

## 2019-01-01 NOTE — Telephone Encounter (Signed)
See note

## 2019-01-03 ENCOUNTER — Ambulatory Visit: Payer: Self-pay | Admitting: Family Medicine

## 2019-01-03 ENCOUNTER — Ambulatory Visit (INDEPENDENT_AMBULATORY_CARE_PROVIDER_SITE_OTHER): Payer: Medicare Other | Admitting: Family Medicine

## 2019-01-03 ENCOUNTER — Other Ambulatory Visit: Payer: Self-pay

## 2019-01-03 ENCOUNTER — Encounter: Payer: Self-pay | Admitting: Family Medicine

## 2019-01-03 VITALS — BP 148/88 | HR 112 | Temp 99.2°F | Ht 71.0 in | Wt 245.9 lb

## 2019-01-03 DIAGNOSIS — R300 Dysuria: Secondary | ICD-10-CM | POA: Diagnosis not present

## 2019-01-03 DIAGNOSIS — R319 Hematuria, unspecified: Secondary | ICD-10-CM

## 2019-01-03 LAB — POCT URINALYSIS DIPSTICK
Bilirubin, UA: NEGATIVE
Blood, UA: POSITIVE
Glucose, UA: NEGATIVE
Nitrite, UA: NEGATIVE
Protein, UA: POSITIVE — AB
Spec Grav, UA: 1.015 (ref 1.010–1.025)
Urobilinogen, UA: 0.2 E.U./dL
pH, UA: 6.5 (ref 5.0–8.0)

## 2019-01-03 MED ORDER — CEPHALEXIN 500 MG PO CAPS: 500.0000 mg | ORAL_CAPSULE | Freq: Four times a day (QID) | ORAL | 0 refills | Status: DC
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
## 2019-01-03 NOTE — Telephone Encounter (Signed)
Patient had appointment today with Dr. Elease Hashimoto for UTI because she stated that she had blood in her urine. Nothing further needed at this time.

## 2019-01-03 NOTE — Telephone Encounter (Signed)
If she is not having dysuria, increasing urinary frequency, gross hematuria, or other new urinary symptom I do not think a UA is needed.  But if she insist, she can have urine dipstick with appropriate urine collection to avoid contamination.  Thanks, BJ

## 2019-01-03 NOTE — Patient Instructions (Signed)

## 2019-01-04 LAB — URINE CULTURE
MICRO NUMBER:: 749408
Result:: NO GROWTH
SPECIMEN QUALITY:: ADEQUATE

## 2019-01-17 ENCOUNTER — Ambulatory Visit (INDEPENDENT_AMBULATORY_CARE_PROVIDER_SITE_OTHER): Payer: Medicare Other | Admitting: Family Medicine

## 2019-01-17 ENCOUNTER — Other Ambulatory Visit: Payer: Self-pay

## 2019-01-17 ENCOUNTER — Encounter: Payer: Self-pay | Admitting: Family Medicine

## 2019-01-17 VITALS — BP 130/82 | HR 92 | Temp 98.2°F | Resp 12 | Ht 71.0 in | Wt 246.4 lb

## 2019-01-17 DIAGNOSIS — I10 Essential (primary) hypertension: Secondary | ICD-10-CM | POA: Diagnosis not present

## 2019-01-17 DIAGNOSIS — N939 Abnormal uterine and vaginal bleeding, unspecified: Secondary | ICD-10-CM | POA: Diagnosis not present

## 2019-01-17 DIAGNOSIS — R319 Hematuria, unspecified: Secondary | ICD-10-CM | POA: Diagnosis not present

## 2019-01-17 LAB — URINALYSIS, ROUTINE W REFLEX MICROSCOPIC
Bilirubin Urine: NEGATIVE
Ketones, ur: NEGATIVE
Leukocytes,Ua: NEGATIVE
Nitrite: NEGATIVE
RBC / HPF: NONE SEEN (ref 0–?)
Specific Gravity, Urine: 1.015 (ref 1.000–1.030)
Total Protein, Urine: NEGATIVE
Urine Glucose: NEGATIVE
Urobilinogen, UA: 0.2 (ref 0.0–1.0)
pH: 7 (ref 5.0–8.0)

## 2019-01-17 NOTE — Patient Instructions (Signed)
A few things to remember from today's visit:   Hematuria, unspecified type - Plan: Urinalysis, Routine w reflex microscopic  Benign essential HTN  Vaginal bleeding, abnormal - Plan: Ambulatory referral to Gynecology, US PELVIC COMPLETE WITH TRANSVAGINAL, CBC  Abnormal Uterine Bleeding Abnormal uterine bleeding means bleeding more than usual from your uterus. It can include:  Bleeding between periods.  Bleeding after sex.  Bleeding that is heavier than normal.  Periods that last longer than usual.  Bleeding after you have stopped having your period (menopause). There are many problems that may cause this. You should see a doctor for any kind of bleeding that is not normal. Treatment depends on the cause of the bleeding. Follow these instructions at home:  Watch your condition for any changes.  Do not use tampons, douche, or have sex, if your doctor tells you not to.  Change your pads often.  Get regular well-woman exams. Make sure they include a pelvic exam and cervical cancer screening.  Keep all follow-up visits as told by your doctor. This is important. Contact a doctor if:  The bleeding lasts more than one week.  You feel dizzy at times.  You feel like you are going to throw up (nauseous).  You throw up. Get help right away if:  You pass out.  You have to change pads every hour.  You have belly (abdominal) pain.  You have a fever.  You get sweaty.  You get weak.  You passing large blood clots from your vagina. Summary  Abnormal uterine bleeding means bleeding more than usual from your uterus.  There are many problems that may cause this. You should see a doctor for any kind of bleeding that is not normal.  Treatment depends on the cause of the bleeding. This information is not intended to replace advice given to you by your health care provider. Make sure you discuss any questions you have with your health care provider. Document Released:  03/12/2009 Document Revised: 05/09/2016 Document Reviewed: 05/09/2016 Elsevier Patient Education  [place].   Please be sure medication list is accurate. If a new problem present, please set up appointment sooner than planned today.
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
## 2019-01-18 ENCOUNTER — Encounter: Payer: Self-pay | Admitting: Family Medicine

## 2019-01-20 ENCOUNTER — Telehealth: Payer: Self-pay | Admitting: Family Medicine

## 2019-01-20 NOTE — Telephone Encounter (Signed)
Pt called and stated that on her ASV she should be taking doxycycline. Pt would like a call back as soon as possible. Please advise

## 2019-01-20 NOTE — Telephone Encounter (Signed)
Please advise 

## 2019-01-21 NOTE — Telephone Encounter (Signed)
Spoke with patient and informed Dr. [name] did not prescribe doxycyline. Patient verbalized understanding and stated that she has Gyn appointment scheduled on 01/28/2019 and waiting to here from [place] for [place].

## 2019-01-21 NOTE — Telephone Encounter (Signed)
I have not prescribed Doxycycline. Most likely old med that was not d/c when updating medications.  [I saw her recently for "hematuria" follow up,ended up being vaginal bleeding. Referred her to gyn and ordered pelvic/treansvaginal US.]  Thanks, BJ

## 2019-01-24 ENCOUNTER — Ambulatory Visit (INDEPENDENT_AMBULATORY_CARE_PROVIDER_SITE_OTHER): Payer: Medicare Other | Admitting: Podiatry

## 2019-01-24 ENCOUNTER — Encounter: Payer: Self-pay | Admitting: Podiatry

## 2019-01-24 ENCOUNTER — Other Ambulatory Visit: Payer: Self-pay

## 2019-01-24 DIAGNOSIS — I739 Peripheral vascular disease, unspecified: Secondary | ICD-10-CM | POA: Diagnosis not present

## 2019-01-24 DIAGNOSIS — L84 Corns and callosities: Secondary | ICD-10-CM | POA: Diagnosis not present

## 2019-01-24 DIAGNOSIS — M79674 Pain in right toe(s): Secondary | ICD-10-CM

## 2019-01-24 DIAGNOSIS — B351 Tinea unguium: Secondary | ICD-10-CM | POA: Diagnosis not present

## 2019-01-24 DIAGNOSIS — M79675 Pain in left toe(s): Secondary | ICD-10-CM | POA: Diagnosis not present

## 2019-01-24 NOTE — Patient Instructions (Signed)
Corns and Calluses Corns are small areas of thickened skin that occur on the top, sides, or tip of a toe. They contain a cone-shaped core with a point that can press on a nerve below. This causes pain.  Calluses are areas of thickened skin that can occur anywhere on the body, including the hands, fingers, palms, soles of the feet, and heels. Calluses are usually larger than corns. What are the causes? Corns and calluses are caused by rubbing (friction) or pressure, such as from shoes that are too tight or do not fit properly. What increases the risk? Corns are more likely to develop in people who have misshapen toes (toe deformities), such as hammer toes. Calluses can occur with friction to any area of the skin. They are more likely to develop in people who:  Work with their hands.  Wear shoes that fit poorly, are too tight, or are high-heeled.  Have toe deformities. What are the signs or symptoms? Symptoms of a corn or callus include:  A hard growth on the skin.  Pain or tenderness under the skin.  Redness and swelling.  Increased discomfort while wearing tight-fitting shoes, if your feet are affected. If a corn or callus becomes infected, symptoms may include:  Redness and swelling that gets worse.  Pain.  Fluid, blood, or pus draining from the corn or callus. How is this diagnosed? Corns and calluses may be diagnosed based on your symptoms, your medical history, and a physical exam. How is this treated? Treatment for corns and calluses may include:  Removing the cause of the friction or pressure. This may involve: ? Changing your shoes. ? Wearing shoe inserts (orthotics) or other protective layers in your shoes, such as a corn pad. ? Wearing gloves.  Applying medicine to the skin (topical medicine) to help soften skin in the hardened, thickened areas.  Removing layers of dead skin with a file to reduce the size of the corn or callus.  Removing the corn or callus with a  scalpel or laser.  Taking antibiotic medicines, if your corn or callus is infected.  Having surgery, if a toe deformity is the cause. Follow these instructions at home:   Take over-the-counter and prescription medicines only as told by your health care provider.  If you were prescribed an antibiotic, take it as told by your health care provider. Do not stop taking it even if your condition starts to improve.  Wear shoes that fit well. Avoid wearing high-heeled shoes and shoes that are too tight or too loose.  Wear any padding, protective layers, gloves, or orthotics as told by your health care provider.  Soak your hands or feet and then use a file or pumice stone to soften your corn or callus. Do this as told by your health care provider.  Check your corn or callus every day for symptoms of infection. Contact a health care provider if you:  Notice that your symptoms do not improve with treatment.  Have redness or swelling that gets worse.  Notice that your corn or callus becomes painful.  Have fluid, blood, or pus coming from your corn or callus.  Have new symptoms. Summary  Corns are small areas of thickened skin that occur on the top, sides, or tip of a toe.  Calluses are areas of thickened skin that can occur anywhere on the body, including the hands, fingers, palms, and soles of the feet. Calluses are usually larger than corns.  Corns and calluses are caused by   rubbing (friction) or pressure, such as from shoes that are too tight or do not fit properly.  Treatment may include wearing any padding, protective layers, gloves, or orthotics as told by your health care provider. This information is not intended to replace advice given to you by your health care provider. Make sure you discuss any questions you have with your health care provider. Document Released: 02/19/2004 Document Revised: 09/04/2018 Document Reviewed: 03/28/2017 Elsevier Patient Education  2020 Elsevier  Inc.   Onychomycosis/Fungal Toenails  WHAT IS IT? An infection that lies within the keratin of your nail plate that is caused by a fungus.  WHY ME? Fungal infections affect all ages, sexes, races, and creeds.  There may be many factors that predispose you to a fungal infection such as age, coexisting medical conditions such as diabetes, or an autoimmune disease; stress, medications, fatigue, genetics, etc.  Bottom line: fungus thrives in a warm, moist environment and your shoes offer such a location.  IS IT CONTAGIOUS? Theoretically, yes.  You do not want to share shoes, nail clippers or files with someone who has fungal toenails.  Walking around barefoot in the same room or sleeping in the same bed is unlikely to transfer the organism.  It is important to realize, however, that fungus can spread easily from one nail to the next on the same foot.  HOW DO WE TREAT THIS?  There are several ways to treat this condition.  Treatment may depend on many factors such as age, medications, pregnancy, liver and kidney conditions, etc.  It is best to ask your doctor which options are available to you.  1. No treatment.   Unlike many other medical concerns, you can live with this condition.  However for many people this can be a painful condition and may lead to ingrown toenails or a bacterial infection.  It is recommended that you keep the nails cut short to help reduce the amount of fungal nail. 2. Topical treatment.  These range from herbal remedies to prescription strength nail lacquers.  About 40-50% effective, topicals require twice daily application for approximately 9 to 12 months or until an entirely new nail has grown out.  The most effective topicals are medical grade medications available through physicians offices. 3. Oral antifungal medications.  With an 80-90% cure rate, the most common oral medication requires 3 to 4 months of therapy and stays in your system for a year as the new nail grows out.   Oral antifungal medications do require blood work to make sure it is a safe drug for you.  A liver function panel will be performed prior to starting the medication and after the first month of treatment.  It is important to have the blood work performed to avoid any harmful side effects.  In general, this medication safe but blood work is required. 4. Laser Therapy.  This treatment is performed by applying a specialized laser to the affected nail plate.  This therapy is noninvasive, fast, and non-painful.  It is not covered by insurance and is therefore, out of pocket.  The results have been very good with a 80-95% cure rate.  The [facility] is the only practice in the area to offer this therapy. 5. Permanent Nail Avulsion.  Removing the entire nail so that a new nail will not grow back. 

## 2019-01-27 ENCOUNTER — Other Ambulatory Visit: Payer: Self-pay

## 2019-01-28 ENCOUNTER — Ambulatory Visit (INDEPENDENT_AMBULATORY_CARE_PROVIDER_SITE_OTHER): Payer: Medicare Other | Admitting: Gynecology

## 2019-01-28 ENCOUNTER — Telehealth: Payer: Self-pay | Admitting: *Deleted

## 2019-01-28 ENCOUNTER — Encounter: Payer: Self-pay | Admitting: Gynecology

## 2019-01-28 VITALS — BP 130/84 | Ht 68.0 in | Wt 245.0 lb

## 2019-01-28 DIAGNOSIS — D4959 Neoplasm of unspecified behavior of other genitourinary organ: Secondary | ICD-10-CM

## 2019-01-28 DIAGNOSIS — Z1272 Encounter for screening for malignant neoplasm of vagina: Secondary | ICD-10-CM | POA: Diagnosis not present

## 2019-01-28 DIAGNOSIS — R300 Dysuria: Secondary | ICD-10-CM

## 2019-01-28 DIAGNOSIS — C52 Malignant neoplasm of vagina: Secondary | ICD-10-CM | POA: Diagnosis not present

## 2019-01-28 DIAGNOSIS — N950 Postmenopausal bleeding: Secondary | ICD-10-CM | POA: Diagnosis not present

## 2019-01-28 DIAGNOSIS — R87624 Cytologic evidence of malignancy on smear of vagina: Secondary | ICD-10-CM | POA: Diagnosis not present

## 2019-01-28 DIAGNOSIS — Z01812 Encounter for preprocedural laboratory examination: Secondary | ICD-10-CM

## 2019-01-28 HISTORY — DX: Malignant neoplasm of vagina: C52

## 2019-01-28 MED ORDER — NITROFURANTOIN MONOHYD MACRO 100 MG PO CAPS: 100.0000 mg | ORAL_CAPSULE | Freq: Two times a day (BID) | ORAL | 0 refills | Status: DC

## 2019-01-28 NOTE — Telephone Encounter (Signed)
-----   Message from Anastasio Auerbach, MD sent at 01/28/2019  2:01 PM EDT ----- Patient needs:1.  Schedule MRI of the pelvis ASAP.  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  Appointment with gynecologic oncologist.  Fungating vaginal tumor awaiting pathology results.  MRI scheduled3.  Patient has ultrasound scheduled 01/30/2019 by her primary physician.  We can cancel that appointment as the MRI will give [place] more information.

## 2019-01-28 NOTE — Telephone Encounter (Addendum)
1. Patient scheduled on 02/06/19 @2 :30pm at [facility] for Mri of pelvis, per scheduling protocol she will need to have BUN and creatine ordered prior to imaging. I will call patient and have her come to our office to have labs drawn. I sent Dr. Phineas Real a message regarding the date.   2. Dr. Denman George on 02/10/19 at 8:30am at [facility] center.   3. Sent message to [place] office to cancel the ultrasound       Patient informed with all the below note, she will check with her daughter to see if she can bring her to come in for labs before the MRI, orders placed.

## 2019-01-28 NOTE — Addendum Note (Signed)
Addended by: Nelva Nay on: 01/28/2019 02:49 PM   Modules accepted: Orders

## 2019-01-28 NOTE — Patient Instructions (Signed)
Office will call you with tissue pathology results.  Office will call you to arrange follow-up appointments

## 2019-01-28 NOTE — Progress Notes (Signed)
    Mia Simpson Feb 07, 1928 [id number]        85 y.o.  G1P1 new patient presents complaining of recent onset of vaginal bleeding and perineal irritation.  She recently used a cleaning product on her underwear which she attributes to causing the irritation and bleeding.  Was recently seen through her family medicine providers and was treated for UTI early in August.  Follow-up exam showed vaginal bleeding and she was referred for further evaluation.  An ultrasound was scheduled for 01/30/2019.  She has a history of hysterectomy with BSO listed in her chart 1975 but on questioning she is unclear what exactly was done and why.  Past medical history,surgical history, problem list, medications, allergies, family history and social history were all reviewed and documented in the EPIC chart.  Directed ROS with pertinent positives and negatives documented in the history of present illness/assessment and plan.  Exam: Caryn Bee assistant Vitals:   01/28/19 1159  BP: 130/84  Weight: 245 lb (111.1 kg)  Height: 5\' 8"  (1.727 m)   General appearance:  Normal Abdomen soft nontender without masses guarding rebound. Pelvic external BUS vagina with atrophic changes.  Speculum exam shows blood within the vaginal canal.  Bluish tissue noted at 12:00 mid to upper vagina.  Cervix not visualized although exam somewhat limited by patient discomfort and bleeding.  Digital exam palpates friable tumor-like mass anterior vaginal wall.  Small amount of tissue noted on my finger upon withdrawal from exam.  This was sent to pathology.  Pap smear was also done of the vagina in general.  No gross masses on bimanual although limited by abdominal girth.  Rectal exam negative although limited by discomfort.  Assessment/Plan:  85 y.o. G1P1 with vaginal bleeding.  Exam shows fungating friable tumor-like growth primarily anterior vaginal wall.  Extent of the vaginal canal was not examined.  Bimanual without gross masses or tenderness  but limited by abdominal girth.  Hysterectomy by history although somewhat unclear.  I discussed my suspicion for tumor with the patient and the need for further evaluation and ultimate treatment.  Will await Pap smear/tissue results.  Will schedule MRI for pelvic surveillance attempt to identify source.  Will plan ultimate referral to gynecologic oncologist.   Anastasio Auerbach MD, 1:54 PM 01/28/2019

## 2019-01-28 NOTE — Addendum Note (Signed)
Addended by: Nelva Nay on: 01/28/2019 02:40 PM   Modules accepted: Orders

## 2019-01-29 ENCOUNTER — Telehealth: Payer: Self-pay

## 2019-01-29 NOTE — Telephone Encounter (Signed)
I called [facility] and spoke with Ronalee Belts.  MRI CPT [phone] is billable and no PA required. Call ref #[id number].  Note added to her appointment.

## 2019-01-30 ENCOUNTER — Other Ambulatory Visit: Payer: Medicare Other

## 2019-01-30 ENCOUNTER — Telehealth: Payer: Self-pay | Admitting: Gynecology

## 2019-01-30 LAB — URINALYSIS, COMPLETE W/RFL CULTURE
Bilirubin Urine: NEGATIVE
Glucose, UA: NEGATIVE
Hyaline Cast: NONE SEEN /LPF
Ketones, ur: NEGATIVE
Nitrites, Initial: NEGATIVE
Specific Gravity, Urine: 1.015 (ref 1.001–1.03)
pH: 7 (ref 5.0–8.0)

## 2019-01-30 LAB — PAP IG W/ RFLX HPV ASCU

## 2019-01-30 LAB — URINE CULTURE
MICRO NUMBER:: 839312
SPECIMEN QUALITY:: ADEQUATE

## 2019-01-30 LAB — CULTURE INDICATED

## 2019-01-30 NOTE — Telephone Encounter (Signed)
I called the patient with the cytology results which showed undifferentiated carcinoma favor adenocarcinoma.  She has an appointment next week for MRI and a follow-up appointment with Dr. Everitt Amber.  She has done scant bleeding since I saw her.  We discussed in general certain treatment options but she understands that Dr. Denman George is the expert and will answer her questions in detail.

## 2019-02-02 NOTE — Progress Notes (Signed)
Subjective: Mia Simpson presents to clinic with cc of painful mycotic toenails and corns both feet which are aggravated when weightbearing with and without shoe gear.  This pain limits her daily activities. Pain symptoms resolve with periodic professional debridement.   Current Outpatient Medications:  .  Calcium Carbonate (CALCIUM 600 PO), Take 2 tablets by mouth daily. , Disp: , Rfl:  .  cholecalciferol (VITAMIN D) 1000 units tablet, Take 1,000 Units by mouth daily., Disp: , Rfl:  .  Cranberry-Vitamin C (AZO CRANBERRY URINARY TRACT) 250-60 MG CAPS, Take 2 capsules by mouth daily. , Disp: , Rfl:  .  hydrochlorothiazide (HYDRODIURIL) 25 MG tablet, Take 25 mg by mouth daily., Disp: , Rfl:  .  Multiple Vitamins-Minerals (MULTIPLE VITAMINS/WOMENS PO), Take 1 tablet by mouth daily. , Disp: , Rfl:  .  nitrofurantoin, macrocrystal-monohydrate, (MACROBID) 100 MG capsule, Take 1 capsule (100 mg total) by mouth 2 (two) times daily., Disp: 14 capsule, Rfl: 0 .  Omega-3 Fatty Acids (FISH OIL) 1200 MG CAPS, Take 2,400 mg by mouth daily., Disp: , Rfl:    No Known Allergies   Objective:  Physical Examination:  Vascular  Examination: Capillary refill time immediate x 10 digits.  Faintly palpable DP pulses b/l.  PT pulses nonpalpable b/l.  Digital hair absent b/l/  Venous insufficiency b/l. Bilateral ankle edema. No open wounds. No pain on calf compression.   Skin temperature gradient warm to cool b/l.  Dermatological Examination: Skin thin and atrophic b/l.  No open wounds b/l.  No interdigital macerations noted b/l.  Elongated, thick, discolored brittle toenails with subungual debris and pain on dorsal palpation of nailbeds 1-5 b/l.  Hyperkeratotic lesions b/l 5th digits, distal tip left 2nd digit dorsal PIPJ left 3rd digit  with tenderness to palpation. No edema, no erythema, no drainage, no flocculence.  Musculoskeletal Examination: Muscle strength 5/5 to all muscle groups  b/l.  Hammertoes 2-5 b/l.  No pain, crepitus or joint discomfort with active/passive ROM.  Neurological Examination: Sensation intact 5/5 b/l with 10 gram monofilament.  Vibratory sensation intact b/l.  Assessment: 1. Mycotic nail infection with pain 1-5 b/l 2.   Corns b/l 5th, left 2nd, left 3rd digits 3.   PAD  Plan: 1. Toenails 1-5 b/l were debrided in length and girth without iatrogenic laceration. 2. Corn(s) pared left 2nd, left 3rd, b/l 5th digits utilizing sterile scalpel blade without incident. 3. Continue soft, supportive shoe gear daily. 4. Report any pedal injuries to medical professional. 5. Follow up 3 months. 6. Patient/POA to call should there be a question/concern in there interim.

## 2019-02-05 ENCOUNTER — Other Ambulatory Visit: Payer: Medicare Other

## 2019-02-05 ENCOUNTER — Other Ambulatory Visit: Payer: Self-pay

## 2019-02-05 DIAGNOSIS — Z01812 Encounter for preprocedural laboratory examination: Secondary | ICD-10-CM | POA: Diagnosis not present

## 2019-02-06 ENCOUNTER — Ambulatory Visit (HOSPITAL_COMMUNITY)
Admission: RE | Admit: 2019-02-06 | Discharge: 2019-02-06 | Disposition: A | Discharge status: Home / Self Care | Payer: Medicare Other | Source: Ambulatory Visit | Attending: Gynecology | Admitting: Gynecology

## 2019-02-06 DIAGNOSIS — D4959 Neoplasm of unspecified behavior of other genitourinary organ: Secondary | ICD-10-CM | POA: Diagnosis not present

## 2019-02-06 DIAGNOSIS — C52 Malignant neoplasm of vagina: Secondary | ICD-10-CM | POA: Diagnosis not present

## 2019-02-06 LAB — BUN: BUN: 15 mg/dL (ref 7–25)

## 2019-02-06 LAB — CREATININE, SERUM: Creat: 0.88 mg/dL (ref 0.60–0.88)

## 2019-02-06 MED ORDER — GADOBUTROL 1 MMOL/ML IV SOLN
10.0000 mL | Freq: Once | INTRAVENOUS | Status: AC | PRN
  Administered 2019-02-06: 15:00:00 10 mL via INTRAVENOUS

## 2019-02-10 ENCOUNTER — Encounter: Payer: Self-pay | Admitting: Gynecologic Oncology

## 2019-02-10 ENCOUNTER — Other Ambulatory Visit: Payer: Self-pay

## 2019-02-10 ENCOUNTER — Encounter: Payer: Self-pay | Admitting: Oncology

## 2019-02-10 ENCOUNTER — Telehealth: Payer: Self-pay | Admitting: Oncology

## 2019-02-10 ENCOUNTER — Inpatient Hospital Stay: Payer: Medicare Other | Attending: Gynecologic Oncology | Admitting: Gynecologic Oncology

## 2019-02-10 VITALS — BP 164/90 | HR 93 | Temp 99.1°F | Resp 18 | Ht 67.5 in | Wt 244.8 lb

## 2019-02-10 DIAGNOSIS — Z79899 Other long term (current) drug therapy: Secondary | ICD-10-CM | POA: Insufficient documentation

## 2019-02-10 DIAGNOSIS — Z87891 Personal history of nicotine dependence: Secondary | ICD-10-CM | POA: Insufficient documentation

## 2019-02-10 DIAGNOSIS — E669 Obesity, unspecified: Secondary | ICD-10-CM | POA: Diagnosis not present

## 2019-02-10 DIAGNOSIS — Z803 Family history of malignant neoplasm of breast: Secondary | ICD-10-CM | POA: Insufficient documentation

## 2019-02-10 DIAGNOSIS — Z9049 Acquired absence of other specified parts of digestive tract: Secondary | ICD-10-CM

## 2019-02-10 DIAGNOSIS — E66811 Obesity, class 1: Secondary | ICD-10-CM | POA: Insufficient documentation

## 2019-02-10 DIAGNOSIS — C52 Malignant neoplasm of vagina: Secondary | ICD-10-CM | POA: Insufficient documentation

## 2019-02-10 DIAGNOSIS — D329 Benign neoplasm of meninges, unspecified: Secondary | ICD-10-CM | POA: Diagnosis not present

## 2019-02-10 DIAGNOSIS — N939 Abnormal uterine and vaginal bleeding, unspecified: Secondary | ICD-10-CM | POA: Insufficient documentation

## 2019-02-10 DIAGNOSIS — Z8042 Family history of malignant neoplasm of prostate: Secondary | ICD-10-CM | POA: Diagnosis not present

## 2019-02-10 DIAGNOSIS — Z90710 Acquired absence of both cervix and uterus: Secondary | ICD-10-CM | POA: Insufficient documentation

## 2019-02-10 DIAGNOSIS — Z90722 Acquired absence of ovaries, bilateral: Secondary | ICD-10-CM | POA: Insufficient documentation

## 2019-02-10 DIAGNOSIS — R600 Localized edema: Secondary | ICD-10-CM | POA: Insufficient documentation

## 2019-02-10 DIAGNOSIS — M1990 Unspecified osteoarthritis, unspecified site: Secondary | ICD-10-CM | POA: Diagnosis not present

## 2019-02-10 DIAGNOSIS — N898 Other specified noninflammatory disorders of vagina: Secondary | ICD-10-CM | POA: Insufficient documentation

## 2019-02-10 DIAGNOSIS — I10 Essential (primary) hypertension: Secondary | ICD-10-CM | POA: Insufficient documentation

## 2019-02-10 DIAGNOSIS — C801 Malignant (primary) neoplasm, unspecified: Secondary | ICD-10-CM

## 2019-02-10 DIAGNOSIS — Z86010 Personal history of colonic polyps: Secondary | ICD-10-CM

## 2019-02-10 NOTE — Progress Notes (Signed)
Met with Mia Simpson after Dr. Rossi's appointment.  Discussed that a referral has been placed to [facility] and [facility] and that they will be calling her with appointments.  Provided her with my card and asked her to call with any questions. 

## 2019-02-10 NOTE — Telephone Encounter (Signed)
Mia Simpson with appointment to see Dr. Alvy Bimler on 02/19/19 at 10:45.  She verbalized understanding and agreement.

## 2019-02-10 NOTE — Telephone Encounter (Signed)
Sheran Spine with [facility] and requested BRAF testing on Accession: [id number].

## 2019-02-10 NOTE — Progress Notes (Signed)
Faxed referral to [facility].

## 2019-02-10 NOTE — Telephone Encounter (Addendum)
Called Mia Simpson with apt for MRI Brain on 02/14/19 with arrival at 6:30 pm. She verbalized understanding and agreement.  She also said she has an appointment with Dr. Alyson Ingles at [facility] tomorrow.

## 2019-02-10 NOTE — Patient Instructions (Signed)
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 It is unclear if this began in the vagina, or spread to the vagina from the bladder or from the colon.  Therefore Dr Denman George is referring you to be seen by a bladder specialist and a colon specialist to evaluate these organs.  She has ordered a scan to look for any obvious spread of the cancer.  The treatment for this cancer will be radiation, possibly with low dose chemotherapy administered once per week during treatment.   She has referred you to a chemotherapy and radiation doctor to discuss this further.
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
## 2019-02-11 DIAGNOSIS — R3915 Urgency of urination: Secondary | ICD-10-CM | POA: Diagnosis not present

## 2019-02-11 DIAGNOSIS — R311 Benign essential microscopic hematuria: Secondary | ICD-10-CM | POA: Diagnosis not present

## 2019-02-12 ENCOUNTER — Telehealth: Payer: Self-pay

## 2019-02-12 ENCOUNTER — Telehealth: Payer: Self-pay | Admitting: Oncology

## 2019-02-12 NOTE — Telephone Encounter (Signed)
-----  Message from Yetta Flock, MD sent at 02/11/2019  5:36 PM EDT ----- Regarding: clinic visit Ryelynn Guedea can you please book this patient for a clinic visit ASAP - if any APPs have an opening this week or next. Thanks  ----- Message ----- From: Everitt Amber, MD Sent: 02/11/2019   3:12 PM EDT To: Yetta Flock, MD Subject: patient with vaginal tumor, history of colon#  Dear Dr Havery Moros, This patient is coming to see you. She has a history of "precancerous" colonic polyps removed in [place] within the past 10-15 years and had a colectomy (left) for one of these.  She now has a very poorly differentiated malignancy in the vagina (left sidewall). It is not associated with HPV or a typical primary vaginal cancer. It may be a melanoma. However, given her history of colonic pathology and the proximity of this lesion to the rectum, I was hoping that she could have her colon evaluated to ensure this is not a colonic primary or met. Thanks so much, Everitt Amber

## 2019-02-12 NOTE — Telephone Encounter (Signed)
Patient scheduled for office visit w/Paula Chester Holstein NP on 02/20/19 at 10:00am. Called patient and gave appt. Information with address and ph#. Patient agrees.

## 2019-02-12 NOTE — Telephone Encounter (Signed)
Mia Simpson with [facility] and requested addition to the Melanoma Tumor Board per Dr. Denman George.  She said they meet the last Friday of the month and she will give Dr. Tamala Julian the information.Marland Kitchen

## 2019-02-14 ENCOUNTER — Ambulatory Visit (HOSPITAL_COMMUNITY)
Admission: RE | Admit: 2019-02-14 | Discharge: 2019-02-14 | Disposition: A | Discharge status: Home / Self Care | Payer: Medicare Other | Source: Ambulatory Visit | Attending: Gynecologic Oncology | Admitting: Gynecologic Oncology

## 2019-02-14 ENCOUNTER — Other Ambulatory Visit: Payer: Self-pay

## 2019-02-14 DIAGNOSIS — I6782 Cerebral ischemia: Secondary | ICD-10-CM | POA: Diagnosis not present

## 2019-02-14 DIAGNOSIS — C52 Malignant neoplasm of vagina: Secondary | ICD-10-CM

## 2019-02-14 MED ORDER — GADOBUTROL 1 MMOL/ML IV SOLN
10.0000 mL | Freq: Once | INTRAVENOUS | Status: AC | PRN
  Administered 2019-02-14: 10 mL via INTRAVENOUS

## 2019-02-17 ENCOUNTER — Encounter (HOSPITAL_COMMUNITY)
Admission: RE | Admit: 2019-02-17 | Discharge: 2019-02-17 | Disposition: A | Discharge status: Home / Self Care | Payer: Medicare Other | Source: Ambulatory Visit | Attending: Gynecologic Oncology | Admitting: Gynecologic Oncology

## 2019-02-17 ENCOUNTER — Telehealth: Payer: Self-pay | Admitting: *Deleted

## 2019-02-17 ENCOUNTER — Other Ambulatory Visit: Payer: Self-pay

## 2019-02-17 DIAGNOSIS — R221 Localized swelling, mass and lump, neck: Secondary | ICD-10-CM | POA: Insufficient documentation

## 2019-02-17 DIAGNOSIS — N898 Other specified noninflammatory disorders of vagina: Secondary | ICD-10-CM | POA: Diagnosis not present

## 2019-02-17 DIAGNOSIS — I700 Atherosclerosis of aorta: Secondary | ICD-10-CM | POA: Insufficient documentation

## 2019-02-17 DIAGNOSIS — Z86010 Personal history of colonic polyps: Secondary | ICD-10-CM | POA: Diagnosis not present

## 2019-02-17 DIAGNOSIS — Z87891 Personal history of nicotine dependence: Secondary | ICD-10-CM | POA: Diagnosis not present

## 2019-02-17 DIAGNOSIS — Z9049 Acquired absence of other specified parts of digestive tract: Secondary | ICD-10-CM

## 2019-02-17 DIAGNOSIS — C801 Malignant (primary) neoplasm, unspecified: Secondary | ICD-10-CM | POA: Diagnosis not present

## 2019-02-17 DIAGNOSIS — Z79899 Other long term (current) drug therapy: Secondary | ICD-10-CM | POA: Insufficient documentation

## 2019-02-17 DIAGNOSIS — K8020 Calculus of gallbladder without cholecystitis without obstruction: Secondary | ICD-10-CM | POA: Insufficient documentation

## 2019-02-17 DIAGNOSIS — C52 Malignant neoplasm of vagina: Secondary | ICD-10-CM | POA: Diagnosis not present

## 2019-02-17 LAB — GLUCOSE, CAPILLARY: Glucose-Capillary: 94 mg/dL (ref 70–99)

## 2019-02-17 MED ORDER — FLUDEOXYGLUCOSE F - 18 (FDG) INJECTION
12.2900 | Freq: Once | INTRAVENOUS | Status: AC
  Administered 2019-02-17: 10:00:00 12.29 via INTRAVENOUS

## 2019-02-17 NOTE — Telephone Encounter (Signed)
This has been advised. KW CMA

## 2019-02-17 NOTE — Telephone Encounter (Signed)
Dr. Caryl Ada called from [facility] regarding the vaginal biopsy that showed a poorly differentiated malignant neoplasm.  She stated she could send it out for some molecular studies due to it have some resemblance of a clear cell sarcoma.  She felt it is necessary.  I reviewed the record with Dr. Tamala Julian stating that she had seen Dr Denman George and was having a PET scan today therefore she would call Dr. Denman George to have the conversation about the possibility of clear cell sarcoma and sending it for molecular studies.  I advised I would let Dr Phineas Real know. KW CMA

## 2019-02-17 NOTE — Telephone Encounter (Signed)
Correct that all future questions need to be directed to GYN oncology as they are now assuming care of the patient.

## 2019-02-19 ENCOUNTER — Other Ambulatory Visit: Payer: Self-pay

## 2019-02-19 ENCOUNTER — Inpatient Hospital Stay (HOSPITAL_BASED_OUTPATIENT_CLINIC_OR_DEPARTMENT_OTHER): Payer: Medicare Other | Admitting: Hematology and Oncology

## 2019-02-19 ENCOUNTER — Encounter: Payer: Self-pay | Admitting: Hematology and Oncology

## 2019-02-19 DIAGNOSIS — C52 Malignant neoplasm of vagina: Secondary | ICD-10-CM | POA: Diagnosis not present

## 2019-02-19 DIAGNOSIS — Z803 Family history of malignant neoplasm of breast: Secondary | ICD-10-CM | POA: Diagnosis not present

## 2019-02-19 DIAGNOSIS — N939 Abnormal uterine and vaginal bleeding, unspecified: Secondary | ICD-10-CM | POA: Diagnosis not present

## 2019-02-19 DIAGNOSIS — Z87891 Personal history of nicotine dependence: Secondary | ICD-10-CM | POA: Diagnosis not present

## 2019-02-19 DIAGNOSIS — Z809 Family history of malignant neoplasm, unspecified: Secondary | ICD-10-CM

## 2019-02-19 DIAGNOSIS — Z8042 Family history of malignant neoplasm of prostate: Secondary | ICD-10-CM | POA: Diagnosis not present

## 2019-02-19 DIAGNOSIS — D329 Benign neoplasm of meninges, unspecified: Secondary | ICD-10-CM

## 2019-02-19 DIAGNOSIS — R600 Localized edema: Secondary | ICD-10-CM | POA: Diagnosis not present

## 2019-02-19 NOTE — Assessment & Plan Note (Signed)
Imaging study of her brain revealed meningioma She is not symptomatic Observe only for now
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
## 2019-02-19 NOTE — Assessment & Plan Note (Signed)
She has mild chronic bilateral lower extremity edema, likely secondary to poor mobility and obesity She will continue diuretic therapy as directed by her primary care doctor

## 2019-02-19 NOTE — Assessment & Plan Note (Signed)
She will be seeing radiation oncologist next week for radiation therapy Currently, her vaginal bleeding is not severe

## 2019-02-19 NOTE — Assessment & Plan Note (Signed)
The patient has locally advanced disease She understood the rationale of why surgery upfront is not recommended Her case will be presented at the GYN oncology tumor board We will review her pathology report carefully Given her age, I believe that concurrent chemotherapy with radiation is going to be highly toxic unless only brachii therapy is recommended If so, I will bring her back and discuss the role of concurrent treatment with carboplatin and Taxol However, if her radiation field is going to be significantly larger, then I would recommend sequential treatment with radiation therapy followed by chemotherapy She understood the plan of care She has appointment to see radiation oncologist on Monday

## 2019-02-20 ENCOUNTER — Ambulatory Visit (INDEPENDENT_AMBULATORY_CARE_PROVIDER_SITE_OTHER): Payer: Medicare Other | Admitting: Nurse Practitioner

## 2019-02-20 ENCOUNTER — Encounter: Payer: Self-pay | Admitting: Nurse Practitioner

## 2019-02-20 VITALS — BP 128/84 | HR 85 | Temp 97.6°F | Ht 70.0 in | Wt 244.0 lb

## 2019-02-20 DIAGNOSIS — Z86010 Personal history of colonic polyps: Secondary | ICD-10-CM

## 2019-02-20 MED ORDER — NA SULFATE-K SULFATE-MG SULF 17.5-3.13-1.6 GM/177ML PO SOLN: ORAL | 0 refills | Status: DC

## 2019-02-20 NOTE — Progress Notes (Signed)
Agree with assessment as outlined. Dr. Denman George reached out to me about this patient and requested colonoscopy to ensure no luminal GI mass or pathology that could be associated with this. If patient agreeable will proceed with colonoscopy. Nevin Bloodgood feel free to book in a 730 slot if more urgent time frame is needed to accommodate her. Thanks

## 2019-02-20 NOTE — Patient Instructions (Signed)
If you are age 83 or older, your body mass index should be between 23-30. Your Body mass index is 35.01 kg/m. If this is out of the aforementioned range listed, please consider follow up with your Primary Care Provider.  If you are age 57 or younger, your body mass index should be between 19-25. Your Body mass index is 35.01 kg/m. If this is out of the aformentioned range listed, please consider follow up with your Primary Care Provider.   You have been scheduled for a colonoscopy. Please follow written instructions given to you at your visit today.  Please pick up your prep supplies at the pharmacy within the next 1-3 days. If you use inhalers (even only as needed), please bring them with you on the day of your procedure. Your physician has requested that you go to www.startemmi.com and enter the access code given to you at your visit today. This web site gives a general overview about your procedure. However, you should still follow specific instructions given to you by our office regarding your preparation for the procedure.  We have sent the following medications to your pharmacy for you to pick up at your convenience: Suprep  Thank you for choosing me and [facility].   Tye Savoy, NP
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
## 2019-02-24 ENCOUNTER — Ambulatory Visit
Admission: RE | Admit: 2019-02-24 | Discharge: 2019-02-24 | Disposition: A | Discharge status: Home / Self Care | Payer: Medicare Other | Source: Ambulatory Visit | Attending: Radiation Oncology | Admitting: Radiation Oncology

## 2019-02-24 ENCOUNTER — Encounter: Payer: Self-pay | Admitting: Radiation Oncology

## 2019-02-24 ENCOUNTER — Other Ambulatory Visit: Payer: Self-pay | Admitting: Oncology

## 2019-02-24 ENCOUNTER — Other Ambulatory Visit: Payer: Self-pay

## 2019-02-24 VITALS — BP 134/78 | HR 82 | Temp 98.2°F | Resp 18 | Wt 242.5 lb

## 2019-02-24 DIAGNOSIS — C52 Malignant neoplasm of vagina: Secondary | ICD-10-CM

## 2019-02-24 DIAGNOSIS — Z808 Family history of malignant neoplasm of other organs or systems: Secondary | ICD-10-CM | POA: Diagnosis not present
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
## 2019-02-24 NOTE — Progress Notes (Signed)
Gynecologic Oncology Multi-Disciplinary Disposition Conference Note  Date of the Conference: 02/24/2019  Patient Name: Mia Simpson  Referring Provider: Dr. Phineas Real Primary GYN Oncologist: Dr. 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 Disposition is for external beam radiation followed by systemic therapy.   This Multidisciplinary conference took place involving physicians from [place], [place], Radiation Oncology, Pathology, Radiology along with the Gynecologic Oncology Nurse Practitioner and RN.  Comprehensive assessment of the patient's malignancy, staging, need for surgery, chemotherapy, radiation therapy, and need for further testing were reviewed. Supportive measures, both inpatient and following discharge were also discussed. The recommended plan of care is documented. Greater than 35 minutes were spent correlating and coordinating this patient's care.

## 2019-02-24 NOTE — Progress Notes (Signed)
GYN Location of Tumor / Histology:  poorly differentiated melanoma vs sarcoma of unclear primary (possible vaginal primary melanoma, possible urologic primary, possible colonic primary).  The patient was fairly healthy for her age.  She reported only significant medical history of hypertension and obesity.  She denies history of heart disease, pulmonary disease, stroke.  She has a good performance status and ambulates independently without shortness of breath or chest pain.  She lives alone however her adult children live nearby.  Her medical history is significant for a hysterectomy for benign uterine fibroids in 1975.  She believes ovaries and tubes removed at that time.  She subsequently had polyps growing within her intestines which she was told were precancerous and had a colectomy for this in 67 in [place].  She has a left-sided abdominal incision through which this colectomy was made.  She denies having a temporary colostomy.  Her last colonoscopy was at age 57, approximately 4 years before her presentation of this disease.  She was recommended to continue having colonoscopies in [place] however [place] these were not able to be performed due to her advanced age not meeting criteria.  Her only other significant illness was a episode of sepsis for which she was admitted in the 1990s with a believe origin of urosepsis.  She has had one prior vaginal delivery of twins.  Mia Simpson presented with symptoms of: The patient reports a history of vaginal spotting since late August 2020.  She immediately presented to her gynecologist, Dr. Phineas Real who performed a pelvic examination which revealed a fleshy mass within the vagina on the left side and anteriorly.  This was biopsied and noted to be poorly differentiated malignant neoplasm.  Biopsies revealed: 01/28/19:  Diagnosis Vagina, biopsy - POORLY DIFFERENTIATED MALIGNANT NEOPLASM. - SEE MICROSCOPIC  DESCRIPTION Microscopic Comment The neoplasm is characterized by diffuse sheets of markedly atypical cells with nuclear enlargement and nucleoli. A battery of immunohistochemical stains shows positivity with Melan-A, SOX-10, vimentin and CD117. The tumor is negative with cytokeratin AE1/AE3, cytokeratin 903, cytokeratin 7, cytokeratin 8/18, CD34, CD56, CD45, CD138, cytokeratin 5/6, chromogranin, desmin, epithelial membrane antigen, estrogen receptor, Factor XIII-A, GATA-3, GCDFP, inhibin, muscle specific actin, p63, PAX-8, progesterone receptor, synaptophysin, TTF-1, WT-1, smooth muscle actin, CD10, CD31, cytokeratin 20 and S100. The morphologic and immunophenotypic differential diagnosis includes malignant melanoma and clear cell sarcoma.  Past/Anticipated interventions by Gyn/Onc surgery, if any: Per Dr. Denman George 02/10/19:  I have ordered a PET scan to evaluate for occult primary source of the malignancy, and disseminated metastatic disease.  Surgical resection of this lesion would require total pelvic exenteration, which would be extremely morbid and not recommended in woman of such advanced age.  I discussed that this type of surgical procedure would require permanent colostomy and urostomy.  She is not interested in this degree of radical surgery or postoperative morbidity.  Alternative treatment for this tumor would be primary chemoradiation.  We will have her meet with the radiation and chemotherapy providers to discuss this planning further.  Past/Anticipated interventions by medical oncology, if any:  Per Dr. Alvy Bimler 02/19/19:   ASSESSMENT & PLAN:  Vaginal cancer [facility]) The patient has locally advanced disease She understood the rationale of why surgery upfront is not recommended Her case will be presented at the GYN oncology tumor board We will review her pathology report carefully Given her age, I believe that concurrent chemotherapy with radiation is going to be highly toxic unless only  brachii therapy is recommended  If so, I will bring her back and discuss the role of concurrent treatment with carboplatin and Taxol However, if her radiation field is going to be significantly larger, then I would recommend sequential treatment with radiation therapy followed by chemotherapy She understood the plan of care She has appointment to see radiation oncologist on Monday  Bilateral lower extremity edema She has mild chronic bilateral lower extremity edema, likely secondary to poor mobility and obesity She will continue diuretic therapy as directed by her primary care doctor  Vaginal bleeding, abnormal She will be seeing radiation oncologist next week for radiation therapy Currently, her vaginal bleeding is not severe  Meningioma [facility]) Imaging study of her brain revealed meningioma She is not symptomatic Observe only for now  Weight changes, if any: States that she has lost a couple pounds  Bowel/Bladder complaints, if LFY:[id number] denies any constipation or diarrhea. Patient denies any dysuria. States that she has some hematuria with urination.  Nausea/Vomiting, if any: none  Pain issues, if any: none  SAFETY ISSUES:  Prior radiation? No  Pacemaker/ICD? No  Possible current pregnancy? No  Is the patient on methotrexate? No  Current Complaints / other details:  Vitals:   02/24/19 1229  BP: 134/78  Pulse: 82  Resp: 18  Temp: 98.2 F (36.8 C)  TempSrc: Oral  SpO2: 100%  Weight: 242 lb 8 oz (110 kg)

## 2019-02-26 ENCOUNTER — Ambulatory Visit
Admission: RE | Admit: 2019-02-26 | Discharge: 2019-02-26 | Disposition: A | Discharge status: Home / Self Care | Payer: Medicare Other | Source: Ambulatory Visit | Attending: Radiation Oncology | Admitting: Radiation Oncology

## 2019-02-26 ENCOUNTER — Other Ambulatory Visit: Payer: Self-pay

## 2019-02-26 DIAGNOSIS — C52 Malignant neoplasm of vagina: Secondary | ICD-10-CM | POA: Diagnosis not present

## 2019-02-26 NOTE — Progress Notes (Signed)
  Radiation Oncology         [phone] ________________________________  Name: Mia Simpson MRN: [id number]  Date: 02/26/2019  DOB: 1927-06-21  SIMULATION AND TREATMENT PLANNING NOTE    ICD-10-CM   1. Vaginal cancer ([place])  C52     DIAGNOSIS:  Clinical Stage III (cT3, cN0, cM0) poorly differentiated melanoma vs sarcoma of unclear primary  NARRATIVE:  The patient was brought to the [facility].  Identity was confirmed.  All relevant records and images related to the planned course of therapy were reviewed.  The patient freely provided informed written consent to proceed with treatment after reviewing the details related to the planned course of therapy. The consent form was witnessed and verified by the simulation staff.  Then, the patient was set-up in a stable reproducible  supine position for radiation therapy.  CT images were obtained.  Surface markings were placed.  The CT images were loaded into the planning software.  Then the target and avoidance structures were contoured.  Treatment planning then occurred.  The radiation prescription was entered and confirmed.  Then, I designed and supervised the construction of a total of 4 medically necessary complex treatment devices.  I have requested : Intensity Modulated Radiotherapy (IMRT) is medically necessary for this case for the following reason:  Small bowel sparing..  I have ordered:dose calc.  PLAN:  The patient will receive 45 Gy in 25  Fractions. She will then proceed with a boost of 10 Gray in 5 fractions to the vaginal mass.  Plans could change depending on the final pathologic review from outside consultation.  It is conceivable patient may receive a brachii therapy boost.  -----------------------------------  Blair Promise, PhD, MD  This document serves as a record of services personally performed by Gery Pray, MD. It was created on his behalf by Wilburn Mylar, a trained medical scribe. The creation of  this record is based on the scribe's personal observations and the provider's statements to them. This document has been checked and approved by the attending provider.

## 2019-03-03 DIAGNOSIS — C52 Malignant neoplasm of vagina: Secondary | ICD-10-CM | POA: Insufficient documentation

## 2019-03-06 ENCOUNTER — Encounter: Payer: Self-pay | Admitting: Gynecology

## 2019-03-10 ENCOUNTER — Ambulatory Visit
Admission: RE | Admit: 2019-03-10 | Discharge: 2019-03-10 | Disposition: A | Discharge status: Home / Self Care | Payer: Medicare Other | Source: Ambulatory Visit | Attending: Radiation Oncology | Admitting: Radiation Oncology

## 2019-03-10 ENCOUNTER — Other Ambulatory Visit: Payer: Self-pay

## 2019-03-10 DIAGNOSIS — C52 Malignant neoplasm of vagina: Secondary | ICD-10-CM | POA: Diagnosis not present

## 2019-03-10 NOTE — Progress Notes (Signed)
  Radiation Oncology         [phone] ________________________________  Name: Mia Simpson MRN: [id number]  Date: 03/10/2019  DOB: [birth year]  Simulation Verification Note    ICD-10-CM   1. Vaginal cancer ([place])  C52     Status: outpatient  NARRATIVE: The patient was brought to the treatment unit and placed in the planned treatment position. The clinical setup was verified. Then port films were obtained and uploaded to the radiation oncology medical record software.  The treatment beams were carefully compared against the planned radiation fields. The position location and shape of the radiation fields was reviewed. They targeted volume of tissue appears to be appropriately covered by the radiation beams. Organs at risk appear to be excluded as planned.  Based on my personal review, I approved the simulation verification. The patient's treatment will proceed as planned.  -----------------------------------  Blair Promise, PhD, MD

## 2019-03-11 ENCOUNTER — Ambulatory Visit
Admission: RE | Admit: 2019-03-11 | Discharge: 2019-03-11 | Disposition: A | Discharge status: Home / Self Care | Payer: Medicare Other | Source: Ambulatory Visit | Attending: Radiation Oncology | Admitting: Radiation Oncology

## 2019-03-11 ENCOUNTER — Telehealth: Payer: Self-pay

## 2019-03-11 ENCOUNTER — Other Ambulatory Visit: Payer: Self-pay

## 2019-03-11 DIAGNOSIS — C52 Malignant neoplasm of vagina: Secondary | ICD-10-CM | POA: Diagnosis not present

## 2019-03-11 NOTE — Telephone Encounter (Signed)
Covid-19 screening questions   Do you now or have you had a fever in the last 14 days?  Do you have any respiratory symptoms of shortness of breath or cough now or in the last 14 days?  Do you have any family members or close contacts with diagnosed or suspected Covid-19 in the past 14 days?  Have you been tested for Covid-19 and found to be positive?       

## 2019-03-12 ENCOUNTER — Ambulatory Visit (AMBULATORY_SURGERY_CENTER): Payer: Medicare Other | Admitting: Gastroenterology

## 2019-03-12 ENCOUNTER — Other Ambulatory Visit: Payer: Self-pay | Admitting: Gastroenterology

## 2019-03-12 ENCOUNTER — Telehealth: Payer: Self-pay | Admitting: Oncology

## 2019-03-12 ENCOUNTER — Encounter: Payer: Self-pay | Admitting: Gastroenterology

## 2019-03-12 ENCOUNTER — Telehealth: Payer: Self-pay

## 2019-03-12 VITALS — BP 159/79 | HR 75 | Temp 97.7°F | Resp 15 | Ht 70.0 in | Wt 244.0 lb

## 2019-03-12 DIAGNOSIS — N898 Other specified noninflammatory disorders of vagina: Secondary | ICD-10-CM | POA: Diagnosis not present

## 2019-03-12 DIAGNOSIS — Z86010 Personal history of colonic polyps: Secondary | ICD-10-CM | POA: Diagnosis not present

## 2019-03-12 DIAGNOSIS — D122 Benign neoplasm of ascending colon: Secondary | ICD-10-CM

## 2019-03-12 DIAGNOSIS — D123 Benign neoplasm of transverse colon: Secondary | ICD-10-CM | POA: Diagnosis not present

## 2019-03-12 MED ORDER — SODIUM CHLORIDE 0.9 % IV SOLN: 500.0000 mL | Freq: Once | INTRAVENOUS | Status: DC

## 2019-03-12 NOTE — Progress Notes (Signed)
Mia Simpson

## 2019-03-12 NOTE — Progress Notes (Signed)
To PACU, VSS. Report to [email or web address] 

## 2019-03-12 NOTE — Progress Notes (Signed)
Called to room to assist during endoscopic procedure.  Patient ID and intended procedure confirmed with present staff. Received instructions for my participation in the procedure from the performing physician.  

## 2019-03-12 NOTE — Op Note (Signed)
[facility] Patient Name: Mia Simpson Procedure Date: 03/12/2019 1:43 PM MRN: [id number] Endoscopist: Remo Lipps P. Havery Moros , MD Age: 85 Referring MD:  Date of Birth: Aug 05, 1927 Gender: Female Account #: [id number] Procedure:                Colonoscopy Indications:              Abnormal PET scan, vaginal mass - melanoma vs.                            sarcoma, GYN ONC requesting colonoscopy clear lower                            GI tract Medicines:                Monitored Anesthesia Care Procedure:                Pre-Anesthesia Assessment:                           - Prior to the procedure, a History and Physical                            was performed, and patient medications and                            allergies were reviewed. The patient's tolerance of                            previous anesthesia was also reviewed. The risks                            and benefits of the procedure and the sedation                            options and risks were discussed with the patient.                            All questions were answered, and informed consent                            was obtained. Prior Anticoagulants: The patient has                            taken no previous anticoagulant or antiplatelet                            agents. ASA Grade Assessment: III - A patient with                            severe systemic disease. After reviewing the risks                            and benefits, the patient was deemed in  satisfactory condition to undergo the procedure.                           After obtaining informed consent, the colonoscope                            was passed under direct vision. Throughout the                            procedure, the patient's blood pressure, pulse, and                            oxygen saturations were monitored continuously. The                            Colonoscope was introduced through the anus  and                            advanced to the the cecum, identified by                            appendiceal orifice and ileocecal valve. The                            colonoscopy was performed without difficulty. The                            patient tolerated the procedure well. The quality                            of the bowel preparation was adequate. The                            ileocecal valve, appendiceal orifice, and rectum                            were photographed. Scope In: 1:48:23 PM Scope Out: 2:18:27 PM Scope Withdrawal Time: 0 hours 25 minutes 55 seconds  Total Procedure Duration: 0 hours 30 minutes 4 seconds  Findings:                 The perianal and digital rectal examinations were                            normal.                           Six flat and sessile polyps were found in the                            ascending colon. The polyps were 3 to 8 mm in size.                            These polyps were removed with a cold snare.  Resection and retrieval were complete.                           Three sessile polyps were found in the transverse                            colon. The polyps were 3 to 4 mm in size. These                            polyps were removed with a cold snare. Resection                            and retrieval were complete.                           Scattered medium-mouthed diverticula were found in                            the left colon and right colon.                           The colon was extremely tortuous.                           The exam was otherwise without abnormality. No mass                            lesions to correlate to vaginal mass. Complications:            No immediate complications. Estimated blood loss:                            Minimal. Estimated Blood Loss:     Estimated blood loss was minimal. Impression:               - Six 3 to 8 mm polyps in the ascending colon,                             removed with a cold snare. Resected and retrieved.                           - Three 3 to 4 mm polyps in the transverse colon,                            removed with a cold snare. Resected and retrieved.                           - Diverticulosis in the left colon and in the right                            colon.                           - Tortuous colon.                           -  The examination was otherwise normal.                           No colonic mass lesions in relation to known                            vaginal mass. Recommendation:           - Patient has a contact number available for                            emergencies. The signs and symptoms of potential                            delayed complications were discussed with the                            patient. Return to normal activities tomorrow.                            Written discharge instructions were provided to the                            patient.                           - Resume previous diet.                           - Continue present medications.                           - Await pathology results. No further surveillance                            colonoscopy is recommended due to age /                            comorbidities Carlota Raspberry. Havery Moros, MD 03/12/2019 2:26:22 PM This report has been signed electronically.

## 2019-03-12 NOTE — Patient Instructions (Addendum)
Thank you for allowing [place] to care for you today!  Await pathology results, approximately 2 weeks, by mail.  Resume previous diet and medications today.  Return to your normal activities tomorrow.  No further colonoscopies are recommended based on current guidelines.    YOU HAD AN ENDOSCOPIC PROCEDURE TODAY AT [facility]:   Refer to the procedure report that was given to you for any specific questions about what was found during the examination.  If the procedure report does not answer your questions, please call your gastroenterologist to clarify.  If you requested that your care partner not be given the details of your procedure findings, then the procedure report has been included in a sealed envelope for you to review at your convenience later.  YOU SHOULD EXPECT: Some feelings of bloating in the abdomen. Passage of more gas than usual.  Walking can help get rid of the air that was put into your GI tract during the procedure and reduce the bloating. If you had a lower endoscopy (such as a colonoscopy or flexible sigmoidoscopy) you may notice spotting of blood in your stool or on the toilet paper. If you underwent a bowel prep for your procedure, you may not have a normal bowel movement for a few days.  Please Note:  You might notice some irritation and congestion in your nose or some drainage.  This is from the oxygen used during your procedure.  There is no need for concern and it should clear up in a day or so.  SYMPTOMS TO REPORT IMMEDIATELY:   Following lower endoscopy (colonoscopy or flexible sigmoidoscopy):  Excessive amounts of blood in the stool  Significant tenderness or worsening of abdominal pains  Swelling of the abdomen that is new, acute  Fever of 100F or higher   For urgent or emergent issues, a gastroenterologist can be reached at any hour by calling [phone].   DIET:  We do recommend a small meal at first, but then you may proceed to your  regular diet.  Drink plenty of fluids but you should avoid alcoholic beverages for 24 hours.  ACTIVITY:  You should plan to take it easy for the rest of today and you should NOT DRIVE or use heavy machinery until tomorrow (because of the sedation medicines used during the test).    FOLLOW UP: Our staff will call the number listed on your records 48-72 hours following your procedure to check on you and address any questions or concerns that you may have regarding the information given to you following your procedure. If we do not reach you, we will leave a message.  We will attempt to reach you two times.  During this call, we will ask if you have developed any symptoms of COVID 19. If you develop any symptoms (ie: fever, flu-like symptoms, shortness of breath, cough etc.) before then, please call [phone].  If you test positive for Covid 19 in the 2 weeks post procedure, please call and report this information to [place].    If any biopsies were taken you will be contacted by phone or by letter within the next 1-3 weeks.  Please call us at [phone] if you have not heard about the biopsies in 3 weeks.    SIGNATURES/CONFIDENTIALITY: You and/or your care partner have signed paperwork which will be entered into your electronic medical record.  These signatures attest to the fact that that the information above on your After Visit Summary has been reviewed  and is understood.  Full responsibility of the confidentiality of this discharge information lies with you and/or your care-partner. 

## 2019-03-12 NOTE — Telephone Encounter (Signed)
Pt had called and I returned her call to advise on her prep.

## 2019-03-12 NOTE — Telephone Encounter (Signed)
Mia Simpson and discussed appointment with Dr. Alvy Bimler on 04/18/19 at 11 am.  She requested that her daughter in law [name] is called with the appointment.  Jacelyn Pi at [phone] and notified her of appointment.  She verbalized agreement.

## 2019-03-13 ENCOUNTER — Other Ambulatory Visit: Payer: Self-pay

## 2019-03-13 ENCOUNTER — Ambulatory Visit
Admission: RE | Admit: 2019-03-13 | Discharge: 2019-03-13 | Disposition: A | Discharge status: Home / Self Care | Payer: Medicare Other | Source: Ambulatory Visit | Attending: Radiation Oncology | Admitting: Radiation Oncology

## 2019-03-13 DIAGNOSIS — C52 Malignant neoplasm of vagina: Secondary | ICD-10-CM | POA: Diagnosis not present

## 2019-03-14 ENCOUNTER — Ambulatory Visit
Admission: RE | Admit: 2019-03-14 | Discharge: 2019-03-14 | Disposition: A | Discharge status: Home / Self Care | Payer: Medicare Other | Source: Ambulatory Visit | Attending: Radiation Oncology | Admitting: Radiation Oncology

## 2019-03-14 ENCOUNTER — Other Ambulatory Visit: Payer: Self-pay

## 2019-03-14 ENCOUNTER — Telehealth: Payer: Self-pay

## 2019-03-14 DIAGNOSIS — C52 Malignant neoplasm of vagina: Secondary | ICD-10-CM | POA: Diagnosis not present

## 2019-03-14 NOTE — Telephone Encounter (Signed)
  Follow up Call-  Call back number 03/12/2019  Post procedure Call Back phone  # [phone]  Permission to leave phone message Yes  Some recent data might be hidden     Patient questions:  Do you have a fever, pain , or abdominal swelling? No. Pain Score  0 *  Have you tolerated food without any problems? Yes.    Have you been able to return to your normal activities? Yes.    Do you have any questions about your discharge instructions: Diet   No. Medications  No. Follow up visit  No.  Do you have questions or concerns about your Care? No.  Actions: * If pain score is 4 or above: No action needed, pain <4.  1. Have you developed a fever since your procedure? no  2.   Have you had an respiratory symptoms (SOB or cough) since your procedure? no  3.   Have you tested positive for COVID 19 since your procedure no  4.   Have you had any family members/close contacts diagnosed with the COVID 19 since your procedure?  No    If yes to any of these questions please route to Joylene John, RN and Alphonsa Gin, RN.

## 2019-03-17 ENCOUNTER — Other Ambulatory Visit: Payer: Self-pay

## 2019-03-17 ENCOUNTER — Ambulatory Visit
Admission: RE | Admit: 2019-03-17 | Discharge: 2019-03-17 | Disposition: A | Discharge status: Home / Self Care | Payer: Medicare Other | Source: Ambulatory Visit | Attending: Radiation Oncology | Admitting: Radiation Oncology

## 2019-03-17 DIAGNOSIS — C52 Malignant neoplasm of vagina: Secondary | ICD-10-CM | POA: Diagnosis not present

## 2019-03-18 ENCOUNTER — Other Ambulatory Visit: Payer: Self-pay

## 2019-03-18 ENCOUNTER — Ambulatory Visit
Admission: RE | Admit: 2019-03-18 | Discharge: 2019-03-18 | Disposition: A | Discharge status: Home / Self Care | Payer: Medicare Other | Source: Ambulatory Visit | Attending: Radiation Oncology | Admitting: Radiation Oncology

## 2019-03-18 DIAGNOSIS — C52 Malignant neoplasm of vagina: Secondary | ICD-10-CM | POA: Diagnosis not present

## 2019-03-19 ENCOUNTER — Ambulatory Visit: Payer: Medicare Other | Admitting: Gastroenterology

## 2019-03-19 ENCOUNTER — Encounter: Payer: Self-pay | Admitting: Gastroenterology

## 2019-03-19 ENCOUNTER — Ambulatory Visit
Admission: RE | Admit: 2019-03-19 | Discharge: 2019-03-19 | Disposition: A | Discharge status: Home / Self Care | Payer: Medicare Other | Source: Ambulatory Visit | Attending: Radiation Oncology | Admitting: Radiation Oncology

## 2019-03-19 ENCOUNTER — Other Ambulatory Visit: Payer: Self-pay

## 2019-03-19 DIAGNOSIS — C52 Malignant neoplasm of vagina: Secondary | ICD-10-CM | POA: Diagnosis not present

## 2019-03-20 ENCOUNTER — Other Ambulatory Visit: Payer: Self-pay

## 2019-03-20 ENCOUNTER — Ambulatory Visit
Admission: RE | Admit: 2019-03-20 | Discharge: 2019-03-20 | Disposition: A | Discharge status: Home / Self Care | Payer: Medicare Other | Source: Ambulatory Visit | Attending: Radiation Oncology | Admitting: Radiation Oncology

## 2019-03-20 DIAGNOSIS — C52 Malignant neoplasm of vagina: Secondary | ICD-10-CM | POA: Diagnosis not present

## 2019-03-21 ENCOUNTER — Other Ambulatory Visit: Payer: Self-pay

## 2019-03-21 ENCOUNTER — Ambulatory Visit
Admission: RE | Admit: 2019-03-21 | Discharge: 2019-03-21 | Disposition: A | Discharge status: Home / Self Care | Payer: Medicare Other | Source: Ambulatory Visit | Attending: Radiation Oncology | Admitting: Radiation Oncology

## 2019-03-21 DIAGNOSIS — C52 Malignant neoplasm of vagina: Secondary | ICD-10-CM | POA: Diagnosis not present

## 2019-03-24 ENCOUNTER — Ambulatory Visit
Admission: RE | Admit: 2019-03-24 | Discharge: 2019-03-24 | Disposition: A | Discharge status: Home / Self Care | Payer: Medicare Other | Source: Ambulatory Visit | Attending: Radiation Oncology | Admitting: Radiation Oncology

## 2019-03-24 ENCOUNTER — Other Ambulatory Visit: Payer: Self-pay

## 2019-03-24 DIAGNOSIS — C52 Malignant neoplasm of vagina: Secondary | ICD-10-CM | POA: Diagnosis not present

## 2019-03-25 ENCOUNTER — Other Ambulatory Visit: Payer: Self-pay

## 2019-03-25 ENCOUNTER — Ambulatory Visit
Admission: RE | Admit: 2019-03-25 | Discharge: 2019-03-25 | Disposition: A | Discharge status: Home / Self Care | Payer: Medicare Other | Source: Ambulatory Visit | Attending: Radiation Oncology | Admitting: Radiation Oncology

## 2019-03-25 DIAGNOSIS — C52 Malignant neoplasm of vagina: Secondary | ICD-10-CM | POA: Diagnosis not present

## 2019-03-26 ENCOUNTER — Ambulatory Visit
Admission: RE | Admit: 2019-03-26 | Discharge: 2019-03-26 | Disposition: A | Discharge status: Home / Self Care | Payer: Medicare Other | Source: Ambulatory Visit | Attending: Radiation Oncology | Admitting: Radiation Oncology

## 2019-03-26 ENCOUNTER — Other Ambulatory Visit: Payer: Self-pay

## 2019-03-26 DIAGNOSIS — C52 Malignant neoplasm of vagina: Secondary | ICD-10-CM | POA: Diagnosis not present

## 2019-03-27 ENCOUNTER — Ambulatory Visit
Admission: RE | Admit: 2019-03-27 | Discharge: 2019-03-27 | Disposition: A | Discharge status: Home / Self Care | Payer: Medicare Other | Source: Ambulatory Visit | Attending: Radiation Oncology | Admitting: Radiation Oncology

## 2019-03-27 ENCOUNTER — Other Ambulatory Visit: Payer: Self-pay

## 2019-03-27 DIAGNOSIS — C52 Malignant neoplasm of vagina: Secondary | ICD-10-CM | POA: Diagnosis not present

## 2019-03-28 ENCOUNTER — Ambulatory Visit
Admission: RE | Admit: 2019-03-28 | Discharge: 2019-03-28 | Disposition: A | Discharge status: Home / Self Care | Payer: Medicare Other | Source: Ambulatory Visit | Attending: Radiation Oncology | Admitting: Radiation Oncology

## 2019-03-28 ENCOUNTER — Other Ambulatory Visit: Payer: Self-pay

## 2019-03-28 DIAGNOSIS — C52 Malignant neoplasm of vagina: Secondary | ICD-10-CM | POA: Diagnosis not present

## 2019-03-31 ENCOUNTER — Other Ambulatory Visit: Payer: Self-pay

## 2019-03-31 ENCOUNTER — Ambulatory Visit
Admission: RE | Admit: 2019-03-31 | Discharge: 2019-03-31 | Disposition: A | Discharge status: Home / Self Care | Payer: Medicare Other | Source: Ambulatory Visit | Attending: Radiation Oncology | Admitting: Radiation Oncology

## 2019-03-31 DIAGNOSIS — C52 Malignant neoplasm of vagina: Secondary | ICD-10-CM | POA: Diagnosis not present

## 2019-04-01 ENCOUNTER — Other Ambulatory Visit: Payer: Self-pay

## 2019-04-01 ENCOUNTER — Ambulatory Visit
Admission: RE | Admit: 2019-04-01 | Discharge: 2019-04-01 | Disposition: A | Discharge status: Home / Self Care | Payer: Medicare Other | Source: Ambulatory Visit | Attending: Radiation Oncology | Admitting: Radiation Oncology

## 2019-04-01 DIAGNOSIS — C52 Malignant neoplasm of vagina: Secondary | ICD-10-CM | POA: Diagnosis not present

## 2019-04-02 ENCOUNTER — Other Ambulatory Visit: Payer: Self-pay

## 2019-04-02 ENCOUNTER — Ambulatory Visit
Admission: RE | Admit: 2019-04-02 | Discharge: 2019-04-02 | Disposition: A | Discharge status: Home / Self Care | Payer: Medicare Other | Source: Ambulatory Visit | Attending: Radiation Oncology | Admitting: Radiation Oncology

## 2019-04-02 DIAGNOSIS — C52 Malignant neoplasm of vagina: Secondary | ICD-10-CM | POA: Diagnosis not present

## 2019-04-03 ENCOUNTER — Ambulatory Visit
Admission: RE | Admit: 2019-04-03 | Discharge: 2019-04-03 | Disposition: A | Discharge status: Home / Self Care | Payer: Medicare Other | Source: Ambulatory Visit | Attending: Radiation Oncology | Admitting: Radiation Oncology

## 2019-04-03 ENCOUNTER — Other Ambulatory Visit: Payer: Self-pay

## 2019-04-03 DIAGNOSIS — C52 Malignant neoplasm of vagina: Secondary | ICD-10-CM | POA: Diagnosis not present

## 2019-04-04 ENCOUNTER — Ambulatory Visit
Admission: RE | Admit: 2019-04-04 | Discharge: 2019-04-04 | Disposition: A | Discharge status: Home / Self Care | Payer: Medicare Other | Source: Ambulatory Visit | Attending: Radiation Oncology | Admitting: Radiation Oncology

## 2019-04-04 ENCOUNTER — Other Ambulatory Visit: Payer: Self-pay

## 2019-04-04 DIAGNOSIS — C52 Malignant neoplasm of vagina: Secondary | ICD-10-CM | POA: Diagnosis not present

## 2019-04-07 ENCOUNTER — Other Ambulatory Visit: Payer: Self-pay

## 2019-04-07 ENCOUNTER — Ambulatory Visit
Admission: RE | Admit: 2019-04-07 | Discharge: 2019-04-07 | Disposition: A | Discharge status: Home / Self Care | Payer: Medicare Other | Source: Ambulatory Visit | Attending: Radiation Oncology | Admitting: Radiation Oncology

## 2019-04-07 DIAGNOSIS — C52 Malignant neoplasm of vagina: Secondary | ICD-10-CM | POA: Diagnosis not present

## 2019-04-08 ENCOUNTER — Other Ambulatory Visit: Payer: Self-pay

## 2019-04-08 ENCOUNTER — Ambulatory Visit
Admission: RE | Admit: 2019-04-08 | Discharge: 2019-04-08 | Disposition: A | Discharge status: Home / Self Care | Payer: Medicare Other | Source: Ambulatory Visit | Attending: Radiation Oncology | Admitting: Radiation Oncology

## 2019-04-08 DIAGNOSIS — C52 Malignant neoplasm of vagina: Secondary | ICD-10-CM | POA: Diagnosis not present

## 2019-04-09 ENCOUNTER — Ambulatory Visit
Admission: RE | Admit: 2019-04-09 | Discharge: 2019-04-09 | Disposition: A | Discharge status: Home / Self Care | Payer: Medicare Other | Source: Ambulatory Visit | Attending: Radiation Oncology | Admitting: Radiation Oncology

## 2019-04-09 ENCOUNTER — Other Ambulatory Visit: Payer: Self-pay

## 2019-04-09 DIAGNOSIS — C52 Malignant neoplasm of vagina: Secondary | ICD-10-CM | POA: Diagnosis not present

## 2019-04-10 ENCOUNTER — Ambulatory Visit
Admission: RE | Admit: 2019-04-10 | Discharge: 2019-04-10 | Disposition: A | Discharge status: Home / Self Care | Payer: Medicare Other | Source: Ambulatory Visit | Attending: Radiation Oncology | Admitting: Radiation Oncology

## 2019-04-10 ENCOUNTER — Other Ambulatory Visit: Payer: Self-pay

## 2019-04-10 DIAGNOSIS — C52 Malignant neoplasm of vagina: Secondary | ICD-10-CM | POA: Diagnosis not present

## 2019-04-11 ENCOUNTER — Other Ambulatory Visit: Payer: Self-pay

## 2019-04-11 ENCOUNTER — Ambulatory Visit
Admission: RE | Admit: 2019-04-11 | Discharge: 2019-04-11 | Disposition: A | Discharge status: Home / Self Care | Payer: Medicare Other | Source: Ambulatory Visit | Attending: Radiation Oncology | Admitting: Radiation Oncology

## 2019-04-11 DIAGNOSIS — C52 Malignant neoplasm of vagina: Secondary | ICD-10-CM | POA: Diagnosis not present

## 2019-04-14 ENCOUNTER — Ambulatory Visit
Admission: RE | Admit: 2019-04-14 | Discharge: 2019-04-14 | Disposition: A | Discharge status: Home / Self Care | Payer: Medicare Other | Source: Ambulatory Visit | Attending: Radiation Oncology | Admitting: Radiation Oncology

## 2019-04-14 ENCOUNTER — Encounter: Payer: Self-pay | Admitting: Radiation Oncology

## 2019-04-14 ENCOUNTER — Other Ambulatory Visit: Payer: Self-pay

## 2019-04-14 DIAGNOSIS — C52 Malignant neoplasm of vagina: Secondary | ICD-10-CM | POA: Diagnosis not present

## 2019-04-15 ENCOUNTER — Telehealth: Payer: Self-pay | Admitting: *Deleted

## 2019-04-15 ENCOUNTER — Ambulatory Visit: Payer: Medicare Other

## 2019-04-15 NOTE — Telephone Encounter (Signed)
CALLED PATIENT TO INFORM OF NEW HDR [place] - SPOKE WITH PATIENT AND SHE ASKED THAT I CALL HER DAUGHTER [place] WR[place] @ [phone] AND GIVE HER THE INFO, WHICH I DID, SPOKE WITH PATIENT'S DAUGHTER [place] AND SHE IS AWARE OF THESE APPTS.

## 2019-04-16 ENCOUNTER — Ambulatory Visit: Payer: Medicare Other

## 2019-04-17 ENCOUNTER — Ambulatory Visit: Payer: Medicare Other

## 2019-04-17 ENCOUNTER — Telehealth: Payer: Self-pay | Admitting: Oncology

## 2019-04-17 NOTE — Telephone Encounter (Signed)
Called Wandra Feinstein (patient's daughter in law) with appointment change with Dr. Alvy Bimler.  Advised that appointment for tomorrow, 04/18/19 will be rescheduled to 05/26/19 at 11:30 when Dian[facility] has finished radiation treatments.  She verbalized agreement and put it on her calender.

## 2019-04-18 ENCOUNTER — Ambulatory Visit: Payer: Medicare Other

## 2019-04-18 ENCOUNTER — Inpatient Hospital Stay: Payer: Medicare Other | Admitting: Hematology and Oncology

## 2019-04-21 ENCOUNTER — Telehealth: Payer: Self-pay | Admitting: *Deleted

## 2019-04-21 ENCOUNTER — Ambulatory Visit: Payer: Medicare Other

## 2019-04-21 NOTE — Telephone Encounter (Signed)
Called patient to remind of New HDR [place], no answer will call later

## 2019-04-21 NOTE — Telephone Encounter (Signed)
Called patient to remind of New HDR [facility],for 04-22-19, spoke with patient and she is aware of these appts.

## 2019-04-22 ENCOUNTER — Ambulatory Visit
Admission: RE | Admit: 2019-04-22 | Discharge: 2019-04-22 | Disposition: A | Discharge status: Home / Self Care | Payer: Medicare Other | Source: Ambulatory Visit | Attending: Radiation Oncology | Admitting: Radiation Oncology

## 2019-04-22 ENCOUNTER — Encounter (INDEPENDENT_AMBULATORY_CARE_PROVIDER_SITE_OTHER): Payer: Self-pay

## 2019-04-22 ENCOUNTER — Other Ambulatory Visit: Payer: Self-pay

## 2019-04-22 ENCOUNTER — Encounter: Payer: Self-pay | Admitting: Radiation Oncology

## 2019-04-22 VITALS — BP 155/74 | HR 85 | Temp 98.0°F | Resp 18 | Ht 70.0 in | Wt 237.1 lb

## 2019-04-22 DIAGNOSIS — C52 Malignant neoplasm of vagina: Secondary | ICD-10-CM

## 2019-04-22 DIAGNOSIS — Z79899 Other long term (current) drug therapy: Secondary | ICD-10-CM | POA: Insufficient documentation

## 2019-04-22 NOTE — Patient Instructions (Signed)
Coronavirus (COVID-19) Are you at risk?  Are you at risk for the Coronavirus (COVID-19)?  To be considered HIGH RISK for Coronavirus (COVID-19), you have to meet the following criteria:  . Traveled to [place], [place], [place], [place] or [place]; or in the [place] to [place], [place], [place], or [place]; and have fever, cough, and shortness of breath within the last 2 weeks of travel OR . Been in close contact with a person diagnosed with COVID-19 within the last 2 weeks and have fever, cough, and shortness of breath . IF YOU DO NOT MEET THESE CRITERIA, YOU ARE CONSIDERED LOW RISK FOR COVID-19.  What to do if you are HIGH RISK for COVID-19?  . If you are having a medical emergency, call 911. . Seek medical care right away. Before you go to a doctor's office, urgent care or emergency department, call ahead and tell them about your recent travel, contact with someone diagnosed with COVID-19, and your symptoms. You should receive instructions from your physician's office regarding next steps of care.  . When you arrive at healthcare provider, tell the healthcare staff immediately you have returned from visiting [place], [place], [place], [place] or [place]; or traveled in the [place] to [place], [place], [place], or [place]; in the last two weeks or you have been in close contact with a person diagnosed with COVID-19 in the last 2 weeks.   . Tell the health care staff about your symptoms: fever, cough and shortness of breath. . After you have been seen by a medical provider, you will be either: o Tested for (COVID-19) and discharged home on quarantine except to seek medical care if symptoms worsen, and asked to  - Stay home and avoid contact with others until you get your results (4-5 days)  - Avoid travel on public transportation if possible (such as bus, train, or airplane) or o Sent to the Emergency Department by EMS for evaluation, COVID-19 testing, and possible  admission depending on your condition and test results.  What to do if you are LOW RISK for COVID-19?  Reduce your risk of any infection by using the same precautions used for avoiding the common cold or flu:  . Wash your hands often with soap and warm water for at least 20 seconds.  If soap and water are not readily available, use an alcohol-based hand sanitizer with at least 60% alcohol.  . If coughing or sneezing, cover your mouth and nose by coughing or sneezing into the elbow areas of your shirt or coat, into a tissue or into your sleeve (not your hands). . Avoid shaking hands with others and consider head nods or verbal greetings only. . Avoid touching your eyes, nose, or mouth with unwashed hands.  . Avoid close contact with people who are sick. . Avoid places or events with large numbers of people in one location, like concerts or sporting events. . Carefully consider travel plans you have or are making. . If you are planning any travel outside or inside the [place], visit the CDC's Travelers' Health webpage for the latest health notices. . If you have some symptoms but not all symptoms, continue to monitor at home and seek medical attention if your symptoms worsen. . If you are having a medical emergency, call 911.   ADDITIONAL HEALTHCARE OPTIONS FOR PATIENTS  [facility] / e-Visit: [email or web address]         [facility]: [phone]  [facility]: [phone]                   [facility]: [phone]   

## 2019-04-22 NOTE — Progress Notes (Signed)
  Radiation Oncology         [phone] ________________________________  Name: Clydia Durfey MRN: [id number]  Date: 04/22/2019  DOB: 11-21-1927  CC: [name], [name], MD  [name], [name], MD  HDR BRACHYTHERAPY NOTE  DIAGNOSIS: ClinicalStage III (cT3, cN0, cM0)poorly differentiated melanoma vs sarcoma of unclear primary   Simple treatment device note: Patient had construction of her custom vaginal cylinder. She will be treated with a 2.0 cm diameter segmented cylinder. This conforms to her anatomy without undue discomfort.  Vaginal brachytherapy procedure node: The patient was brought to the [facility] suite. Identity was confirmed. All relevant records and images related to the planned course of therapy were reviewed. The patient freely provided informed written consent to proceed with treatment after reviewing the details related to the planned course of therapy. The consent form was witnessed and verified by the simulation staff. Then, the patient was set-up in a stable reproducible supine position for radiation therapy. Pelvic exam revealed the vaginal cuff to be intact . The patient's custom vaginal cylinder was placed in the proximal vagina. This was affixed to the CT/MR stabilization plate to prevent slippage. Patient tolerated the placement well.  Verification simulation note:  A fiducial marker was placed within the vaginal cylinder. An AP and lateral film was then obtained through the pelvis area. This documented accurate position of the vaginal cylinder for treatment.  HDR BRACHYTHERAPY TREATMENT  The remote afterloading device was affixed to the vaginal cylinder by catheter. Patient then proceeded to undergo her first high-dose-rate treatment directed at the mid vagina. The patient was prescribed a dose of 6.0 gray to be delivered to the mucosal surface. Treatment length was 8.0 cm. Patient was treated with 1 channel using 17 dwell positions. Treatment time was 291.9 seconds. Iridium 192  was the high-dose-rate source for treatment. The patient tolerated the treatment well. After completion of her therapy, a radiation survey was performed documenting return of the iridium source into the [employer] safe.   PLAN: Patient will return in week for her second high-dose-rate brachytherapy treatment. ________________________________  Blair Promise, PhD, MD

## 2019-04-22 NOTE — Progress Notes (Signed)
  Radiation Oncology         [phone] ________________________________  Name: Mia Simpson MRN: [id number]  Date: 04/22/2019  DOB: [birth year]  SIMULATION AND TREATMENT PLANNING NOTE HDR BRACHYTHERAPY  DIAGNOSIS:  ClinicalStage III (cT3, cN0, cM0)poorly differentiated melanoma vs sarcoma of unclear primary  NARRATIVE:  The patient was brought to the [facility].  Identity was confirmed.  All relevant records and images related to the planned course of therapy were reviewed.  The patient freely provided informed written consent to proceed with treatment after reviewing the details related to the planned course of therapy. The consent form was witnessed and verified by the simulation staff.  Then, the patient was set-up in a stable reproducible  supine position for radiation therapy.  CT images were obtained.  Surface markings were placed.  The CT images were loaded into the planning software.  Then the target and avoidance structures were contoured.  Treatment planning then occurred.  The radiation prescription was entered and confirmed.   I have requested : Brachytherapy Isodose Plan and Dosimetry Calculations to plan the radiation distribution.    PLAN:  The patient will receive 24 Gy in 4 fractions directed to the mid vaginal area.  The patient will be treated with a 2.0 diameter segmented cylinder with a treatment length of 8 cm.  Iridium 192 will be the high-dose-rate source.    ________________________________  Blair Promise, PhD, MD

## 2019-04-22 NOTE — Progress Notes (Signed)
Mia Simpson presents today for new [facility] HDR with Dr. Sondra Come. Pt denies c/o pain. Pt reports "stomach cramp" yesterday while bending over. Pt denies dysuria/hematuria. Pt denies vaginal bleeding. Pt reports a discharge from "somewhere", most likely vaginal. Discharge is "white and slippery". Pt denies rectal bleeding, diarrhea/constipation. Pt denies abdominal bloating, N/V.  BP (!) 155/74 (BP Location: Right Arm, Patient Position: Sitting)   Pulse 85   Temp 98 F (36.7 C) (Temporal)   Resp 18   Ht 5\' 10"  (1.778 m)   Wt 237 lb 2 oz (107.6 kg)   SpO2 96%   BMI 34.02 kg/m   Wt Readings from Last 3 Encounters:  04/22/19 237 lb 2 oz (107.6 kg)  03/12/19 244 lb (110.7 kg)  02/24/19 242 lb 8 oz (110 kg)   Loma Sousa, RN BSN

## 2019-04-22 NOTE — Progress Notes (Signed)
  Radiation Oncology         [phone] ________________________________  Name: Mia Simpson MRN: [id number]  Date: 04/22/2019  DOB: 06-17-1927  Vaginal Brachytherapy Procedure Note  CC: [name], [name], MD [name], [name], MD    ICD-10-CM   1. Vaginal cancer ([place])  C52     Diagnosis: ClinicalStage III (cT3, cN0, cM0)poorly differentiated melanoma vs sarcoma of unclear primary    Narrative: She returns today for vaginal cylinder fitting.  She recently completed her external beam radiation therapy directed to the pelvis region.  Overall she tolerated this treatment well.  Patient has some occasional vaginal discharge no vaginal bleeding at this time.  She denies any problems with diarrhea or nausea or abdominal bloating  ALLERGIES: has No Known Allergies.  Meds: Current Outpatient Medications  Medication Sig Dispense Refill  . Calcium Carbonate (CALCIUM 600 PO) Take 2 tablets by mouth daily.     . hydrochlorothiazide (HYDRODIURIL) 25 MG tablet Take 25 mg by mouth as needed.     . mirabegron ER (MYRBETRIQ) 25 MG TB24 tablet Take 25 mg by mouth daily.    . Multiple Vitamins-Minerals (MULTIPLE VITAMINS/WOMENS PO) Take 1 tablet by mouth daily.     . Omega-3 Fatty Acids (FISH OIL) 1200 MG CAPS Take 2,400 mg by mouth daily.     No current facility-administered medications for this encounter.     Physical Findings: The patient is in no acute distress. Patient is alert and oriented.  height is 5\' 10"  (1.778 m) and weight is 237 lb 2 oz (107.6 kg). Her temporal temperature is 98 F (36.7 C). Her blood pressure is 155/74 (abnormal) and her pulse is 85. Her respiration is 18 and oxygen saturation is 96%.   No palpable cervical, supraclavicular or axillary lymphoadenopathy. The heart has a regular rate and rhythm. The lungs are clear to auscultation. Abdomen soft and non-tender.  On pelvic examination the external genitalia were unremarkable.  On bimanual examination patient's vaginal  mass has decreased significantly in size.  Continues to be residual palpable mass along the anterior left lateral vaginal wall that extends to within approximately 2 cm to the introitus.  No  palpable along the proximal vagina for approximately 2 cm towards the cuff region.  Lab Findings: Lab Results  Component Value Date   WBC 6.7 12/31/2016   HGB 12.6 12/31/2016   HCT 38.4 12/31/2016   MCV 89.9 12/31/2016   PLT 183 12/31/2016    Radiographic Findings: No results found.  Impression: ClinicalStage III (cT3, cN0, cM0)poorly differentiated melanoma vs sarcoma of unclear primary   Patient was fitted for a vaginal cylinder. The patient will be treated with a 2.0 cm diameter cylinder with a treatment length of 8.0 cm. This distended the vaginal vault without undue discomfort. The patient tolerated the procedure well.  The patient was successfully fitted for a vaginal cylinder. The patient is appropriate to begin vaginal brachytherapy.   Plan: The patient will proceed with CT simulation and vaginal brachytherapy today.    _______________________________   Blair Promise, PhD, MD

## 2019-04-29 ENCOUNTER — Telehealth: Payer: Self-pay | Admitting: *Deleted

## 2019-04-29 NOTE — Telephone Encounter (Signed)
Called patient to remind of HDR Tx. for 04-30-19 @ 1 pm, spoke with patient and she is aware of this tx.

## 2019-04-30 ENCOUNTER — Ambulatory Visit
Admission: RE | Admit: 2019-04-30 | Discharge: 2019-04-30 | Disposition: A | Discharge status: Home / Self Care | Payer: Medicare Other | Source: Ambulatory Visit | Attending: Radiation Oncology | Admitting: Radiation Oncology

## 2019-04-30 ENCOUNTER — Other Ambulatory Visit: Payer: Self-pay

## 2019-04-30 DIAGNOSIS — C52 Malignant neoplasm of vagina: Secondary | ICD-10-CM | POA: Diagnosis present

## 2019-04-30 NOTE — Progress Notes (Signed)
  Radiation Oncology         [phone] ________________________________  Name: Mia Simpson MRN: [id number]  Date: 04/30/2019  DOB: 1927/06/19  CC: [name], [name], MD  [name], [name], MD  HDR BRACHYTHERAPY NOTE  DIAGNOSIS: ClinicalStage III (cT3, cN0, cM0)poorly differentiated melanoma vs sarcoma of unclear primary   Simple treatment device note: Patient had construction of her custom vaginal cylinder. She will be treated with a 2.0 cm diameter segmented cylinder. This conforms to her anatomy without undue discomfort.  Vaginal brachytherapy procedure node: The patient was brought to the [place] suite. Identity was confirmed. All relevant records and images related to the planned course of therapy were reviewed. The patient freely provided informed written consent to proceed with treatment after reviewing the details related to the planned course of therapy. The consent form was witnessed and verified by the simulation staff. Then, the patient was set-up in a stable reproducible supine position for radiation therapy. Pelvic exam revealed the vaginal cuff to be intact . The patient's custom vaginal cylinder was placed in the proximal vagina. This was affixed to the CT/MR stabilization plate to prevent slippage. Patient tolerated the placement well.  Verification simulation note:  A fiducial marker was placed within the vaginal cylinder. An AP and lateral film was then obtained through the pelvis area. This documented accurate position of the vaginal cylinder for treatment.  HDR BRACHYTHERAPY TREATMENT  The remote afterloading device was affixed to the vaginal cylinder by catheter. Patient then proceeded to undergo her second high-dose-rate treatment directed at the mid vagina. The patient was prescribed a dose of 6.0 gray to be delivered to the mucosal surface. Treatment length was 8.0 cm. Patient was treated with 1 channel using 17 dwell positions. Treatment time was 314.7 seconds. Iridium 192  was the high-dose-rate source for treatment. The patient tolerated the treatment well. After completion of her therapy, a radiation survey was performed documenting return of the iridium source into the GammaMed safe.   PLAN: Patient will return in one week for her third high-dose-rate brachytherapy treatment. ________________________________    Blair Promise, PhD, MD  This document serves as a record of services personally performed by Gery Pray, MD. It was created on his behalf by Clerance Lav, a trained medical scribe. The creation of this record is based on the scribe's personal observations and the provider's statements to them. This document has been checked and approved by the attending provider.

## 2019-05-05 ENCOUNTER — Other Ambulatory Visit: Payer: Self-pay

## 2019-05-06 ENCOUNTER — Ambulatory Visit (INDEPENDENT_AMBULATORY_CARE_PROVIDER_SITE_OTHER): Payer: Medicare Other | Admitting: Family Medicine

## 2019-05-06 ENCOUNTER — Encounter: Payer: Self-pay | Admitting: Family Medicine

## 2019-05-06 ENCOUNTER — Telehealth: Payer: Self-pay | Admitting: *Deleted

## 2019-05-06 VITALS — BP 130/70 | HR 100 | Resp 16 | Ht 70.0 in | Wt 235.0 lb

## 2019-05-06 DIAGNOSIS — E559 Vitamin D deficiency, unspecified: Secondary | ICD-10-CM | POA: Diagnosis not present

## 2019-05-06 DIAGNOSIS — H9190 Unspecified hearing loss, unspecified ear: Secondary | ICD-10-CM

## 2019-05-06 DIAGNOSIS — M7502 Adhesive capsulitis of left shoulder: Secondary | ICD-10-CM

## 2019-05-06 DIAGNOSIS — I10 Essential (primary) hypertension: Secondary | ICD-10-CM | POA: Diagnosis not present

## 2019-05-06 DIAGNOSIS — Z0000 Encounter for general adult medical examination without abnormal findings: Secondary | ICD-10-CM

## 2019-05-06 DIAGNOSIS — R0982 Postnasal drip: Secondary | ICD-10-CM

## 2019-05-06 DIAGNOSIS — M7500 Adhesive capsulitis of unspecified shoulder: Secondary | ICD-10-CM | POA: Insufficient documentation

## 2019-05-06 LAB — VITAMIN D 25 HYDROXY (VIT D DEFICIENCY, FRACTURES): VITD: 42.22 ng/mL (ref 30.00–100.00)

## 2019-05-06 LAB — BASIC METABOLIC PANEL
BUN: 9 mg/dL (ref 6–23)
CO2: 30 mEq/L (ref 19–32)
Calcium: 9.2 mg/dL (ref 8.4–10.5)
Chloride: 106 mEq/L (ref 96–112)
Creatinine, Ser: 0.76 mg/dL (ref 0.40–1.20)
GFR: 86.6 mL/min (ref >60.00)
Glucose, Bld: 109 mg/dL — ABNORMAL HIGH (ref 70–99)
Potassium: 4 mEq/L (ref 3.5–5.1)
Sodium: 143 mEq/L (ref 135–145)

## 2019-05-06 MED ORDER — FLUTICASONE PROPIONATE 50 MCG/ACT NA SUSP: 1.0000 | Freq: Every evening | NASAL | 3 refills | Status: DC | PRN

## 2019-05-06 MED ORDER — DICLOFENAC SODIUM 1 % EX GEL: 4.0000 g | Freq: Four times a day (QID) | CUTANEOUS | 4 refills | Status: DC

## 2019-05-06 MED ORDER — HYDROCHLOROTHIAZIDE 25 MG PO TABS: 25.0000 mg | ORAL_TABLET | ORAL | 3 refills | Status: DC | PRN

## 2019-05-06 NOTE — Patient Instructions (Addendum)
A few things to remember from today's visit:   Benign essential HTN - Plan: Basic Metabolic Panel  Vitamin D deficiency, unspecified - Plan: VITAMIN D 25 Hydroxy (Vit-D Deficiency, Fractures)  Medicare annual wellness visit, subsequent  Post-nasal drainage - Plan: fluticasone (FLONASE) 50 MCG/ACT nasal spray  Chronic left shoulder pain - Plan: diclofenac Sodium (VOLTAREN) 1 % GEL   Ms. Moorehead , Thank you for taking time to come for your Medicare Wellness Visit. I appreciate your ongoing commitment to your health goals. Please review the following plan we discussed and let me know if I can assist you in the future.   These are the goals we discussed: Goals   None     This is a list of the screening recommended for you and due dates:  Health Maintenance  Topic Date Due  . Flu Shot  Completed  . DEXA scan (bone density measurement)  Completed  . Pneumonia vaccines  Completed  . Tetanus Vaccine  Discontinued    A few tips:  -As we age balance is not as good as it was, so there is a higher risks for falls. Please remove small rugs and furniture that is "in your way" and could increase the risk of falls. Stretching exercises may help with fall prevention: Yoga and Tai Chi are some examples. Low impact exercise is better, so you are not very achy the next day.  -Sun screen and avoidance of direct sun light recommended. Caution with dehydration, if working outdoors be sure to drink enough fluids.  - Some medications are not safe as we age, increases the risk of side effects and can potentially interact with other medication you are also taken;  including some of over the counter medications. Be sure to let me know when you start a new medication even if it is a dietary/vitamin supplement.   -Healthy diet low in red meet/animal fat and sugar + regular physical activity is recommended.       Screening schedule for the next 5-10 years:  Fall prevention   Advance directives:   Please see a lawyer and/or go to this website to help you with advanced directives and designating a health care power of attorney so that your wishes will be followed should you become too ill to make your own medical decisions.  [email or web address]   Please be sure medication list is accurate. If a new problem present, please set up appointment sooner than planned today.

## 2019-05-06 NOTE — Telephone Encounter (Signed)
Called patient to remind of HDR Tx. For 05-07-19 @ 1 pm, lvm for a return call

## 2019-05-06 NOTE — Assessment & Plan Note (Signed)
PT did not help. She is not a good candidate for surgery. Topical Diclofenac recommended qid prn. If not better we can consider Tramadol. Continue ROM exercises.

## 2019-05-06 NOTE — Assessment & Plan Note (Signed)
BP adequately controlled. Continue HCTZ 25 mg daily. Recommend monitoring BP regularly. Continue low salt diet.
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
## 2019-05-07 ENCOUNTER — Other Ambulatory Visit: Payer: Self-pay

## 2019-05-07 ENCOUNTER — Ambulatory Visit
Admission: RE | Admit: 2019-05-07 | Discharge: 2019-05-07 | Disposition: A | Discharge status: Home / Self Care | Payer: Medicare Other | Source: Ambulatory Visit | Attending: Radiation Oncology | Admitting: Radiation Oncology

## 2019-05-07 DIAGNOSIS — C52 Malignant neoplasm of vagina: Secondary | ICD-10-CM

## 2019-05-07 NOTE — Progress Notes (Signed)
  Radiation Oncology         [phone] ________________________________  Name: Mia Simpson MRN: [id number]  Date: 05/07/2019  DOB: 11-04-1927  CC: [name], [name], MD  [name], [name], MD  HDR BRACHYTHERAPY NOTE  DIAGNOSIS: ClinicalStage III (cT3, cN0, cM0)poorly differentiated melanoma vs sarcoma of unclear primary   Simple treatment device note: Patient had construction of her custom vaginal cylinder. She will be treated with a 2.0 cm diameter segmented cylinder. This conforms to her anatomy without undue discomfort.  Vaginal brachytherapy procedure node: The patient was brought to the [place] suite. Identity was confirmed. All relevant records and images related to the planned course of therapy were reviewed. The patient freely provided informed written consent to proceed with treatment after reviewing the details related to the planned course of therapy. The consent form was witnessed and verified by the simulation staff. Then, the patient was set-up in a stable reproducible supine position for radiation therapy. Pelvic exam revealed the vaginal cuff to be intact . The patient's custom vaginal cylinder was placed in the proximal vagina. This was affixed to the CT/MR stabilization plate to prevent slippage. Patient tolerated the placement well.  Verification simulation note:  A fiducial marker was placed within the vaginal cylinder. An AP and lateral film was then obtained through the pelvis area. This documented accurate position of the vaginal cylinder for treatment.  HDR BRACHYTHERAPY TREATMENT  The remote afterloading device was affixed to the vaginal cylinder by catheter. Patient then proceeded to undergo her third high-dose-rate treatment directed at the mid vagina. The patient was prescribed a dose of 6.0 gray to be delivered to the mucosal surface. Treatment length was 8.0 cm. Patient was treated with 1 channel using 17 dwell positions. Treatment time was 336.9 seconds. Iridium 192  was the high-dose-rate source for treatment. The patient tolerated the treatment well. After completion of her therapy, a radiation survey was performed documenting return of the iridium source into the GammaMed safe.   PLAN: Patient will return in one week for her fourth and final high-dose-rate brachytherapy treatment. ________________________________    Blair Promise, PhD, MD  This document serves as a record of services personally performed by Gery Pray, MD. It was created on his behalf by Clerance Lav, a trained medical scribe. The creation of this record is based on the scribe's personal observations and the provider's statements to them. This document has been checked and approved by the attending provider.

## 2019-05-13 ENCOUNTER — Telehealth: Payer: Self-pay | Admitting: *Deleted

## 2019-05-13 NOTE — Telephone Encounter (Signed)
Called patient to remind of HDR Tx. For 05-14-19 @ 1 pm, spoke with patient and she is aware of this tx.

## 2019-05-14 ENCOUNTER — Ambulatory Visit
Admission: RE | Admit: 2019-05-14 | Discharge: 2019-05-14 | Disposition: A | Discharge status: Home / Self Care | Payer: Medicare Other | Source: Ambulatory Visit | Attending: Radiation Oncology | Admitting: Radiation Oncology

## 2019-05-14 ENCOUNTER — Encounter: Payer: Self-pay | Admitting: Radiation Oncology

## 2019-05-14 ENCOUNTER — Ambulatory Visit: Payer: Medicare Other | Admitting: Podiatry

## 2019-05-14 ENCOUNTER — Other Ambulatory Visit: Payer: Self-pay

## 2019-05-14 DIAGNOSIS — C52 Malignant neoplasm of vagina: Secondary | ICD-10-CM

## 2019-05-14 DIAGNOSIS — Z923 Personal history of irradiation: Secondary | ICD-10-CM

## 2019-05-14 HISTORY — DX: Personal history of irradiation: Z92.3

## 2019-05-14 NOTE — Progress Notes (Signed)
  Radiation Oncology         [phone] ________________________________  Name: Mia Simpson MRN: [id number]  Date: 05/14/2019  DOB: 05-18-1928  CC: [name], [name], MD  [name], [name], MD  HDR BRACHYTHERAPY NOTE  DIAGNOSIS: ClinicalStage III (cT3, cN0, cM0)poorly differentiated melanoma vs sarcoma of unclear primary   Simple treatment device note: Patient had construction of her custom vaginal cylinder. She will be treated with a 2.0 cm diameter segmented cylinder. This conforms to her anatomy without undue discomfort.  Vaginal brachytherapy procedure node: The patient was brought to the [place] suite. Identity was confirmed. All relevant records and images related to the planned course of therapy were reviewed. The patient freely provided informed written consent to proceed with treatment after reviewing the details related to the planned course of therapy. The consent form was witnessed and verified by the simulation staff. Then, the patient was set-up in a stable reproducible supine position for radiation therapy. Pelvic exam revealed the vaginal cuff to be intact . The patient's custom vaginal cylinder was placed in the proximal vagina. This was affixed to the CT/MR stabilization plate to prevent slippage. Patient tolerated the placement well.  Verification simulation note:  A fiducial marker was placed within the vaginal cylinder. An AP and lateral film was then obtained through the pelvis area. This documented accurate position of the vaginal cylinder for treatment.  HDR BRACHYTHERAPY TREATMENT  The remote afterloading device was affixed to the vaginal cylinder by catheter. Patient then proceeded to undergo her fourth high-dose-rate treatment directed at the mid vagina. The patient was prescribed a dose of 6.0 gray to be delivered to the mucosal surface. Treatment length was 8.0 cm. Patient was treated with 1 channel using 17 dwell positions. Treatment time was 359.5 seconds. Iridium  192 was the high-dose-rate source for treatment. The patient tolerated the treatment well. After completion of her therapy, a radiation survey was performed documenting return of the iridium source into the GammaMed safe.   PLAN: The patient has completed her definitive course of external beam and brachytherapy .  She will return in one month for routine follow-up. ________________________________    Blair Promise, PhD, MD  This document serves as a record of services personally performed by Gery Pray, MD. It was created on his behalf by Clerance Lav, a trained medical scribe. The creation of this record is based on the scribe's personal observations and the provider's statements to them. This document has been checked and approved by the attending provider.

## 2019-05-16 ENCOUNTER — Telehealth: Payer: Self-pay | Admitting: *Deleted

## 2019-05-16 DIAGNOSIS — H900 Conductive hearing loss, bilateral: Secondary | ICD-10-CM

## 2019-05-16 NOTE — Telephone Encounter (Signed)
Copied from [place] [id number]. Topic: General - Inquiry >> May 16, 2019  1:45 PM Mathis Bud wrote: Reason for CRM: Patient sister called regarding a referral to patient needs for office  [facility] for [place] [phone]

## 2019-05-16 NOTE — Telephone Encounter (Signed)
Referral placed.

## 2019-05-26 ENCOUNTER — Ambulatory Visit: Payer: Medicare Other | Admitting: Radiation Oncology

## 2019-05-26 ENCOUNTER — Other Ambulatory Visit: Payer: Self-pay

## 2019-05-26 ENCOUNTER — Ambulatory Visit: Payer: Medicare Other | Admitting: Hematology and Oncology

## 2019-05-26 ENCOUNTER — Inpatient Hospital Stay: Payer: Medicare Other | Attending: Hematology and Oncology | Admitting: Hematology and Oncology

## 2019-05-26 ENCOUNTER — Telehealth: Payer: Self-pay | Admitting: Hematology and Oncology

## 2019-05-26 ENCOUNTER — Encounter: Payer: Self-pay | Admitting: Hematology and Oncology

## 2019-05-26 VITALS — BP 174/96 | HR 93 | Temp 97.8°F | Resp 18 | Ht 70.0 in | Wt 231.2 lb

## 2019-05-26 DIAGNOSIS — R609 Edema, unspecified: Secondary | ICD-10-CM | POA: Diagnosis not present

## 2019-05-26 DIAGNOSIS — R600 Localized edema: Secondary | ICD-10-CM | POA: Diagnosis not present

## 2019-05-26 DIAGNOSIS — C52 Malignant neoplasm of vagina: Secondary | ICD-10-CM

## 2019-05-26 DIAGNOSIS — N898 Other specified noninflammatory disorders of vagina: Secondary | ICD-10-CM | POA: Diagnosis not present

## 2019-05-26 DIAGNOSIS — N939 Abnormal uterine and vaginal bleeding, unspecified: Secondary | ICD-10-CM

## 2019-05-26 NOTE — Assessment & Plan Note (Signed)
She has no further vaginal bleeding She has very minimum clear discharge which is not unexpected Observe closely for now

## 2019-05-26 NOTE — Assessment & Plan Note (Signed)
She tolerated recent radiation treatment well without major side effects She has no further vaginal bleeding I recommend repeat PET CT scan in a month and I will see her back next month for further follow-up She would likely need further treatment if she have residual disease seen on PET CT scan

## 2019-05-26 NOTE — Telephone Encounter (Signed)
Scheduled appt per 12/28 sch message - unable to reach pt . Left message with appt date and time

## 2019-05-26 NOTE — Progress Notes (Signed)
[facility] OFFICE PROGRESS NOTE  Patient Care Team: [name], [name], MD as PCP - General (Family Medicine) Everitt Amber, MD as Consulting Physician (Gynecologic Oncology)  ASSESSMENT & PLAN:  Vaginal cancer [facility]) She tolerated recent radiation treatment well without major side effects She has no further vaginal bleeding I recommend repeat PET CT scan in a month and I will see her back next month for further follow-up She would likely need further treatment if she have residual disease seen on PET CT scan  Bilateral lower extremity edema She has stable bilateral lower extremity edema I will defer to her primary care doctor for further management She has prescription hydrochlorothiazide to take as needed  Vaginal bleeding, abnormal She has no further vaginal bleeding She has very minimum clear discharge which is not unexpected Observe closely for now   Orders Placed This Encounter  Procedures  . NM PET Image Restag (PS) Skull Base To Thigh    Standing Status:   Future    Standing Expiration Date:   05/25/2020    Order Specific Question:   If indicated for the ordered procedure, I authorize the administration of a radiopharmaceutical per Radiology protocol    Answer:   Yes    Order Specific Question:   Preferred imaging location?    Answer:   [facility]    Order Specific Question:   Radiology Contrast Protocol - do NOT remove file path    Answer:   [email or web address]    Order Specific Question:   ** REASON FOR EXAM (FREE TEXT)    Answer:   vaginal cancer s/p radiation, assess response    INTERVAL HISTORY: Please see below for problem oriented charting. She returns for further follow-up Since her last time I saw her, she has completed radiation treatment She tolerated radiation well She has no further vaginal bleeding but have minimal vaginal discharge She denies exacerbation of lower extremity edema Her energy level is  fair No recent infection, fever or chills  SUMMARY OF ONCOLOGIC HISTORY: Oncology History Overview Note  BRAF neg, HMB-45 is negative   Vaginal cancer ([place])  01/28/2019 Pathology Results   Vagina, biopsy - POORLY DIFFERENTIATED MALIGNANT NEOPLASM. - SEE MICROSCOPIC DESCRIPTION Microscopic Comment The neoplasm is characterized by diffuse sheets of markedly atypical cells with nuclear enlargement and nucleoli. A battery of immunohistochemical stains shows positivity with Melan-A, SOX-10, vimentin and CD117. The tumor is negative with cytokeratin AE1/AE3, cytokeratin 903, cytokeratin 7, cytokeratin 8/18, CD34, CD56, CD45, CD138, cytokeratin 5/6, chromogranin, desmin, epithelial membrane antigen, estrogen receptor, Factor XIII-A, GATA-3, GCDFP, inhibin, muscle specific actin, p63, PAX-8, progesterone receptor, synaptophysin, TTF-1, WT-1, smooth muscle actin, CD10, CD31, cytokeratin 20 and S100. The morphologic and immunophenotypic differential diagnosis includes malignant melanoma and clear cell sarcoma. Molecular genetic studies can be performed if requested   02/06/2019 Imaging   MRI pelvis 5.8 cm vaginal soft tissue mass with central necrosis, which shows a focal area of invasion through the inferior left levator ani muscle.   No evidence of pelvic lymphadenopathy.   02/14/2019 Imaging   MRI brain 1. 2.3 cm extra-axial mass along the right aspect of the clivus/posterior clinoid process consistent with a meningioma. 2. No evidence of intracranial metastases. 3. Moderate chronic small vessel ischemic disease.   02/17/2019 PET scan   1. Soft tissue mass within the vagina with central necrosis is again noted and is intensely FDG avid. 2. No specific findings to suggest nodal metastasis or distant metastatic disease. 3. There is  a low-density subcutaneous soft tissue nodule within the right side of neck which is FDG avid. The CT appearance favors a sebaceous cyst. If inflamed/infected this may  account for the increased uptake. Metastatic disease is less favored but not 100% excluded. Clinical correlation and biopsy if indicated. 4. Gallstones 5.  Aortic Atherosclerosis (ICD10-I70.0).     02/19/2019 Cancer Staging   Staging form: Vagina, AJCC 8th Edition - Clinical: Stage III (cT3, cN0, cM0) - Signed by Heath Lark, MD on 02/19/2019     REVIEW OF SYSTEMS:   Constitutional: Denies fevers, chills or abnormal weight loss Eyes: Denies blurriness of vision Ears, nose, mouth, throat, and face: Denies mucositis or sore throat Respiratory: Denies cough, dyspnea or wheezes Cardiovascular: Denies palpitation, chest discomfort  Gastrointestinal:  Denies nausea, heartburn or change in bowel habits Skin: Denies abnormal skin rashes Lymphatics: Denies new lymphadenopathy or easy bruising Neurological:Denies numbness, tingling or new weaknesses Behavioral/Psych: Mood is stable, no new changes  All other systems were reviewed with the patient and are negative.  I have reviewed the past medical history, past surgical history, social history and family history with the patient and they are unchanged from previous note.  ALLERGIES:  has No Known Allergies.  MEDICATIONS:  Current Outpatient Medications  Medication Sig Dispense Refill  . Calcium Carbonate (CALCIUM 600 PO) Take 2 tablets by mouth daily.     . diclofenac Sodium (VOLTAREN) 1 % GEL Apply 4 g topically 4 (four) times daily. 150 g 4  . fluticasone (FLONASE) 50 MCG/ACT nasal spray Place 1 spray into both nostrils at bedtime as needed for allergies or rhinitis. 16 g 3  . hydrochlorothiazide (HYDRODIURIL) 25 MG tablet Take 1 tablet (25 mg total) by mouth as needed. 90 tablet 3  . mirabegron ER (MYRBETRIQ) 25 MG TB24 tablet Take 25 mg by mouth daily.    . Multiple Vitamins-Minerals (MULTIPLE VITAMINS/WOMENS PO) Take 1 tablet by mouth daily.     . Omega-3 Fatty Acids (FISH OIL) 1200 MG CAPS Take 2,400 mg by mouth daily.     No current  facility-administered medications for this visit.    PHYSICAL EXAMINATION: ECOG PERFORMANCE STATUS: 1 - Symptomatic but completely ambulatory  Vitals:   05/26/19 1124  BP: (!) 174/96  Pulse: 93  Resp: 18  Temp: 97.8 F (36.6 C)  SpO2: 100%   Filed Weights   05/26/19 1124  Weight: 231 lb 3.2 oz (104.9 kg)    GENERAL:alert, no distress and comfortable HEART: She has stable bilateral lower extremity edema NEURO: alert & oriented x 3 with fluent speech, no focal motor/sensory deficits  LABORATORY DATA:  I have reviewed the data as listed    Component Value Date/Time   NA 143 05/06/2019 1114   K 4.0 05/06/2019 1114   CL 106 05/06/2019 1114   CO2 30 05/06/2019 1114   GLUCOSE 109 (H) 05/06/2019 1114   BUN 9 05/06/2019 1114   CREATININE 0.76 05/06/2019 1114   CREATININE 0.88 02/05/2019 1052   CALCIUM 9.2 05/06/2019 1114   PROT 7.8 06/05/2016 0946   ALBUMIN 4.3 06/05/2016 0946   AST 28 06/05/2016 0946   ALT 20 06/05/2016 0946   ALKPHOS 56 06/05/2016 0946   BILITOT 0.4 06/05/2016 0946   GFRNONAA 60 (L) 12/31/2016 1854   GFRAA >60 12/31/2016 1854    No results found for: SPEP, UPEP  Lab Results  Component Value Date   WBC 6.7 12/31/2016   NEUTROABS 2.8 06/05/2016   HGB 12.6 12/31/2016  HCT 38.4 12/31/2016   MCV 89.9 12/31/2016   PLT 183 12/31/2016      Chemistry      Component Value Date/Time   NA 143 05/06/2019 1114   K 4.0 05/06/2019 1114   CL 106 05/06/2019 1114   CO2 30 05/06/2019 1114   BUN 9 05/06/2019 1114   CREATININE 0.76 05/06/2019 1114   CREATININE 0.88 02/05/2019 1052      Component Value Date/Time   CALCIUM 9.2 05/06/2019 1114   ALKPHOS 56 06/05/2016 0946   AST 28 06/05/2016 0946   ALT 20 06/05/2016 0946   BILITOT 0.4 06/05/2016 0946       All questions were answered. The patient knows to call the clinic with any problems, questions or concerns. No barriers to learning was detected.  I spent 15 minutes counseling the patient face to  face. The total time spent in the appointment was 20 minutes and more than 50% was on counseling and review of test results  Heath Lark, MD 05/26/2019 2:26 PM

## 2019-05-26 NOTE — Assessment & Plan Note (Signed)
She has stable bilateral lower extremity edema I will defer to her primary care doctor for further management She has prescription hydrochlorothiazide to take as needed

## 2019-05-28 NOTE — Progress Notes (Incomplete)
  Patient Name: Mia Simpson MRN: [id number] DOB: [birth year] Referring Physician: [name] BETTY (Profile Not Attached) Date of Service: 05/14/2019 [facility], [place]                                                        End Of Treatment Note  Diagnoses: C52-Malignant neoplasm of vagina  Cancer Staging: ClinicalStage III (cT3, cN0, cM0)poorly differentiated melanoma vs sarcoma of unclear primary  Intent: Curative  Radiation Treatment Dates: 03/10/2019 through 05/14/2019 Site Technique Total Dose (Gy) Dose per Fx (Gy) Completed Fx Beam Energies  Vagina: Pelvis_vagina HDR-brachy 24/24 6 4/4 Ir-192  Vagina: Pelvis IMRT 45/45 1.8 25/25 10X   Narrative: The patient tolerated HDR brachytherapy relatively well. She completed external beam radiation on 04/14/2019 and began HDR treatments on 04/22/2019. No significant complaints noted.  Plan: The patient will follow-up with radiation oncology in one month.  ________________________________________________   Blair Promise, PhD, MD  This document serves as a record of services personally performed by Gery Pray, MD. It was created on his behalf by Clerance Lav, a trained medical scribe. The creation of this record is based on the scribe's personal observations and the provider's statements to them. This document has been checked and approved by the attending provider.

## 2019-05-28 NOTE — Progress Notes (Incomplete)
  Patient Name: NAVIKA GOGOLA MRN: [id number] DOB: 07-Sep-1927 Referring Physician: [name] BETTY (Profile Not Attached) Date of Service: 04/14/2019 [facility], [place]                                                        End Of Treatment Note  Diagnoses: C52-Malignant neoplasm of vagina  Cancer Staging: ClinicalStage III (cT3, cN0, cM0)poorly differentiated melanoma vs sarcoma of unclear primary  Intent: Curative  Radiation Treatment Dates: 03/10/2019 through 04/14/2019 Site Technique Total Dose (Gy) Dose per Fx (Gy) Completed Fx Beam Energies  Pelvis: Pelvis IMRT 45/45 1.8 25/25 10X   Narrative: The patient tolerated external beam radiation therapy relatively well. She did report clear vaginal discharge, nocturia, and mild to moderate fatigue throughout treatment. She also reported some mild diarrhea towards the end of treatment that improved with Imodium. She denied pelvic pain, rectal bleeding, diarrhea, constipation, and dysuria.  On today's examination, the vaginal mass has decreased significantly in size. There was minimal bleeding noted.  Plan: Based on today's exam, the patient will benefit significantly from brachytherapy rather than the external beam boost. Vaginal cylinder fitting, CT simulation, and first HDR brachytherapy treatment is scheduled for 04/22/2019.  ________________________________________________   Blair Promise, PhD, MD  This document serves as a record of services personally performed by Gery Pray, MD. It was created on his behalf by Clerance Lav, a trained medical scribe. The creation of this record is based on the scribe's personal observations and the provider's statements to them. This document has been checked and approved by the attending provider.

## 2019-06-11 ENCOUNTER — Ambulatory Visit (INDEPENDENT_AMBULATORY_CARE_PROVIDER_SITE_OTHER): Payer: Medicare Other | Admitting: Otolaryngology

## 2019-06-13 ENCOUNTER — Other Ambulatory Visit: Payer: Self-pay

## 2019-06-13 ENCOUNTER — Encounter (HOSPITAL_COMMUNITY)
Admission: RE | Admit: 2019-06-13 | Discharge: 2019-06-13 | Disposition: A | Discharge status: Home / Self Care | Payer: Medicare Other | Source: Ambulatory Visit | Attending: Hematology and Oncology | Admitting: Hematology and Oncology

## 2019-06-13 DIAGNOSIS — K8020 Calculus of gallbladder without cholecystitis without obstruction: Secondary | ICD-10-CM | POA: Diagnosis not present

## 2019-06-13 DIAGNOSIS — I517 Cardiomegaly: Secondary | ICD-10-CM | POA: Insufficient documentation

## 2019-06-13 DIAGNOSIS — C52 Malignant neoplasm of vagina: Secondary | ICD-10-CM | POA: Insufficient documentation

## 2019-06-13 DIAGNOSIS — Z79899 Other long term (current) drug therapy: Secondary | ICD-10-CM | POA: Diagnosis not present

## 2019-06-13 DIAGNOSIS — I700 Atherosclerosis of aorta: Secondary | ICD-10-CM | POA: Insufficient documentation

## 2019-06-13 LAB — GLUCOSE, CAPILLARY: Glucose-Capillary: 88 mg/dL (ref 70–99)

## 2019-06-13 MED ORDER — FLUDEOXYGLUCOSE F - 18 (FDG) INJECTION
11.4000 | Freq: Once | INTRAVENOUS | Status: AC | PRN
  Administered 2019-06-13: 11.4 via INTRAVENOUS

## 2019-06-16 ENCOUNTER — Encounter: Payer: Self-pay | Admitting: Radiation Oncology

## 2019-06-16 ENCOUNTER — Ambulatory Visit
Admission: RE | Admit: 2019-06-16 | Discharge: 2019-06-16 | Disposition: A | Discharge status: Home / Self Care | Payer: Medicare Other | Source: Ambulatory Visit | Attending: Radiation Oncology | Admitting: Radiation Oncology

## 2019-06-16 ENCOUNTER — Other Ambulatory Visit: Payer: Self-pay

## 2019-06-16 ENCOUNTER — Inpatient Hospital Stay: Payer: Medicare Other | Attending: Hematology and Oncology | Admitting: Hematology and Oncology

## 2019-06-16 VITALS — BP 134/80 | HR 103 | Temp 97.8°F | Resp 20 | Wt 226.6 lb

## 2019-06-16 VITALS — BP 134/80 | HR 103 | Temp 97.8°F | Resp 18 | Ht 70.0 in

## 2019-06-16 DIAGNOSIS — Z923 Personal history of irradiation: Secondary | ICD-10-CM | POA: Insufficient documentation

## 2019-06-16 DIAGNOSIS — R600 Localized edema: Secondary | ICD-10-CM | POA: Insufficient documentation

## 2019-06-16 DIAGNOSIS — Z7189 Other specified counseling: Secondary | ICD-10-CM | POA: Diagnosis not present

## 2019-06-16 DIAGNOSIS — C52 Malignant neoplasm of vagina: Secondary | ICD-10-CM | POA: Insufficient documentation

## 2019-06-16 DIAGNOSIS — N939 Abnormal uterine and vaginal bleeding, unspecified: Secondary | ICD-10-CM | POA: Diagnosis not present

## 2019-06-16 DIAGNOSIS — K8020 Calculus of gallbladder without cholecystitis without obstruction: Secondary | ICD-10-CM | POA: Diagnosis not present

## 2019-06-16 NOTE — Progress Notes (Signed)
Mia Simpson is  Here for a follow-up appointment today.Patient denies any pain or fatigue. Patient denies any vaginal or rectal bleeding. . Patient denies any vaginal discharge.Patient denies any dysuria. Patient denies any issues with her bowels.  Patient reports nocturia x2. Patient denies any nausea or vomiting. Patient  States that her appetite is getting better. Patient was given dilators and teaching today. She was given xs and xs +Patient verbalized that she understand how to use the dilator. Vitals:   06/16/19 1140  BP: 134/80  Pulse: (!) 103  Resp: 20  Temp: 97.8 F (36.6 C)  SpO2: 96%  Weight: 226 lb 9.6 oz (102.8 kg)

## 2019-06-16 NOTE — Progress Notes (Addendum)
Radiation Oncology         [phone] ________________________________  Name: Mia Simpson MRN: [id number]  Date: 06/16/2019  DOB: 12/14/1927  Follow-Up Visit Note  CC: [name], [name], MD  [name], [name], MD    ICD-10-CM   1. Vaginal cancer ([place])  C52     Diagnosis: ClinicalStage III (cT3, cN0, cM0)poorly differentiated melanoma vs sarcoma of unclear primary  Interval Since Last Radiation: One month and two days.  Radiation Treatment Dates: 03/10/2019 through 05/14/2019 Site Technique Total Dose (Gy) Dose per Fx (Gy) Completed Fx Beam Energies  Vagina: Pelvis_vagina HDR-brachy 24/24 6 4/4 Ir-192  Vagina: Pelvis IMRT 45/45 1.8 25/25 10X    Narrative:  The patient returns today for routine follow-up. Patient has been doing well overall. Since end of treatment, patient followed up with Dr. Alvy Bimler on 05/26/2019, during which time she did not have any complaints with the exception of very minimal clear discharge.  PET scan on 06/13/2019 showed considerable improvement in size and activity of the vaginal mass, maximum SUV 6.2 compared to previously 14.8. It also showed resolution of the prior hypermetabolic subcutaneous lesion along the right posterior neck. No findings of adenopathy or metastatic disease.  On review of systems, patient reports no pelvic pain or vaginal bleeding.  Her appetite and energy level continue to to improve.  ALLERGIES:  has No Known Allergies.  Meds: Current Outpatient Medications  Medication Sig Dispense Refill  . Calcium Carbonate (CALCIUM 600 PO) Take 2 tablets by mouth daily.     . diclofenac Sodium (VOLTAREN) 1 % GEL Apply 4 g topically 4 (four) times daily. 150 g 4  . fluticasone (FLONASE) 50 MCG/ACT nasal spray Place 1 spray into both nostrils at bedtime as needed for allergies or rhinitis. 16 g 3  . mirabegron ER (MYRBETRIQ) 25 MG TB24 tablet Take 25 mg by mouth daily.    . Multiple Vitamins-Minerals (MULTIPLE VITAMINS/WOMENS PO) Take 1  tablet by mouth daily.     . Omega-3 Fatty Acids (FISH OIL) 1200 MG CAPS Take 2,400 mg by mouth daily.    . hydrochlorothiazide (HYDRODIURIL) 25 MG tablet Take 1 tablet (25 mg total) by mouth as needed. (Patient not taking: Reported on 06/16/2019) 90 tablet 3   No current facility-administered medications for this encounter.    Physical Findings: The patient is in no acute distress. Patient is alert and oriented.  weight is 226 lb 9.6 oz (102.8 kg). Her temperature is 97.8 F (36.6 C). Her blood pressure is 134/80 and her pulse is 103 (abnormal). Her respiration is 20 and oxygen saturation is 96%. .  No significant changes. Lungs are clear to auscultation bilaterally. Heart has regular rate and rhythm. No palpable cervical, supraclavicular, or axillary adenopathy. Abdomen soft, non-tender, normal bowel sounds. Pelvic exam not performed today.  Exam during HDR procedures showed significant tumor shrinkage but residual  tumor along the anterior vaginal wall after external beam treatments   Lab Findings: Lab Results  Component Value Date   WBC 6.7 12/31/2016   HGB 12.6 12/31/2016   HCT 38.4 12/31/2016   MCV 89.9 12/31/2016   PLT 183 12/31/2016    Radiographic Findings: NM PET Image Restag (PS) Skull Base To Thigh  Result Date: 06/13/2019 CLINICAL DATA:  Subsequent treatment strategy for vaginal adenocarcinoma. EXAM: NUCLEAR MEDICINE PET SKULL BASE TO THIGH TECHNIQUE: 11.4 mCi F-18 FDG was injected intravenously. Full-ring PET imaging was performed from the skull base to thigh after the radiotracer. CT data was obtained  and used for attenuation correction and anatomic localization. Fasting blood glucose: 88 mg/dl COMPARISON:  02/17/2019 FINDINGS: Mediastinal blood pool activity: SUV max 2.5 Liver activity: SUV max NA NECK: The subcutaneous cystic lesion along the right posteromedial neck which was previously hypermetabolic is completely resolved and was likely inflammatory. There is a separate  stable hypodensity in the subcutaneous tissues posterolaterally along the occipital region on image 4/4 which is stable and not hypermetabolic compatible with a benign sebaceous cyst or similar benign subcutaneous lesion. Supraglottic activity at the pharyngo esophageal junction is likely physiologic, maximum SUV 8.1, previously 6.8. Incidental CT findings: Bilateral common carotid atherosclerotic calcification. CHEST: No significant abnormal hypermetabolic activity in this region. Incidental CT findings: Mild cardiomegaly. Atherosclerotic calcification of the aortic arch. ABDOMEN/PELVIS: Improved vaginal wall thickening compared to prior although not completely resolved. Cross-sectional area at the level of the mass currently 4.3 by 3.6 cm, previously 5.5 by 4.2 cm. Activity at the site of the previous mass with maximum SUV 6.2, previously 14.8. No new lesion or new adenopathy observed. A small punctate focus of activity along the anterior margin of the stomach with maximum SUV 4.9 is probably physiologic. Incidental CT findings: Numerous gallstones fill the gallbladder. Aortoiliac atherosclerotic vascular disease. Photopenic left kidney lower pole cyst. SKELETON: No significant abnormal hypermetabolic activity in this region. Incidental CT findings: Endplate sclerosis at [id number] and L2-3. Degenerative facet arthropathy in the lumbar spine. Old sternal fracture, healed. IMPRESSION: 1. Considerable improvement in size and activity of the vaginal mass, current maximum SUV 6.2, previously 14.8. No findings of adenopathy or metastatic disease at this time. 2. Resolution of the prior hypermetabolic subcutaneous lesion along the right posterior neck, which was probably inflammatory. 3. Other imaging findings of potential clinical significance: Aortic Atherosclerosis (ICD10-I70.0). Mild cardiomegaly. Cholelithiasis. Electronically Signed   By: Van Clines M.D.   On: 06/13/2019 13:58    Impression: ClinicalStage  III (cT3, cN0, cM0)poorly differentiated melanoma vs sarcoma of unclear primary  The patient is recovering from the effects of radiation.  Significant response to external beam and brachytherapy treatments as evidenced by PET scan findings as above  Plan: Patient will follow-up with Dr. Alvy Bimler later today for discussion of possible additional treatment or close follow-up.  I have recommended the patient follow-up with Dr. Denman George in 2 months for detailed exam.  Routine follow-up in radiation oncology in 5 months.  Today the patient was given a vaginal dilator and instructions on its use given her pelvic radiation therapy and brachytherapy treatments.  ____________________________________   Blair Promise, PhD, MD  This document serves as a record of services personally performed by Gery Pray, MD. It was created on his behalf by Clerance Lav, a trained medical scribe. The creation of this record is based on the scribe's personal observations and the provider's statements to them. This document has been checked and approved by the attending provider.

## 2019-06-17 ENCOUNTER — Telehealth: Payer: Self-pay | Admitting: *Deleted

## 2019-06-17 NOTE — Telephone Encounter (Signed)
CALLED PATIENT TO INFORM OF FU APPT. WITH DR. Tucker ON 11-24-19 @ 3:30 PM, SPOKE WITH PATIENT AND SHE IS AWARE OF THIS APPT.

## 2019-06-18 ENCOUNTER — Encounter: Payer: Self-pay | Admitting: Hematology and Oncology

## 2019-06-18 DIAGNOSIS — Z7189 Other specified counseling: Secondary | ICD-10-CM | POA: Insufficient documentation

## 2019-06-18 NOTE — Assessment & Plan Note (Signed)
We have extensive discussions about the role of palliative chemotherapy I reviewed imaging studies with the patient and her daughter Currently, she have no symptoms She is almost completely recovered from recent side effects of radiation therapy She has remarkable positive response to treatment We discussed the role of aggressive surveillance imaging versus starting her on palliative chemotherapy right now The patient has elected to undergo surveillance right now and to repeat imaging study in 3 months She is educated to call me if she have new symptoms of pain, growing lesion or bleeding.

## 2019-06-18 NOTE — Progress Notes (Signed)
[place] OFFICE PROGRESS NOTE  Patient Care Team: [name], [name], MD as PCP - General (Family Medicine) Everitt Amber, MD as Consulting Physician (Gynecologic Oncology)  ASSESSMENT & PLAN:  Vaginal cancer [facility]) We have extensive discussions about the role of palliative chemotherapy I reviewed imaging studies with the patient and her daughter Currently, she have no symptoms She is almost completely recovered from recent side effects of radiation therapy She has remarkable positive response to treatment We discussed the role of aggressive surveillance imaging versus starting her on palliative chemotherapy right now The patient has elected to undergo surveillance right now and to repeat imaging study in 3 months She is educated to call me if she have new symptoms of pain, growing lesion or bleeding.  Bilateral lower extremity edema This is stable She is not symptomatic She is on diuretic therapy  Vaginal bleeding, abnormal She denies further vaginal bleeding We will observe closely for now  Goals of care, counseling/discussion We discussed goals of care We review prognosis overall with or without treatment Due to lack of symptoms, she would like to preserve quality of life and I think this is reasonable   Orders Placed This Encounter  Procedures  . NM PET Image Restag (PS) Skull Base To Thigh    Standing Status:   Future    Standing Expiration Date:   06/17/2020    Order Specific Question:   If indicated for the ordered procedure, I authorize the administration of a radiopharmaceutical per Radiology protocol    Answer:   Yes    Order Specific Question:   Preferred imaging location?    Answer:   [facility]    Order Specific Question:   Radiology Contrast Protocol - do NOT remove file path    Answer:   [email or web address]    Order Specific Question:   ** REASON FOR EXAM (FREE TEXT)    Answer:   vaginal cancer s/p radiation  assess for residual disease  . Comprehensive metabolic panel    Standing Status:   Future    Standing Expiration Date:   07/22/2020  . CBC with Differential/Platelet    Standing Status:   Future    Standing Expiration Date:   07/22/2020    All questions were answered. The patient knows to call the clinic with any problems, questions or concerns. The total time spent in the appointment was 30 minutes encounter with patients including review of chart and various tests results, discussions about plan of care and coordination of care plan   Heath Lark, MD 06/18/2019 8:12 AM  INTERVAL HISTORY: Please see below for problem oriented charting. She returns with her daughter for further follow-up She feels well She is almost completely recovered from side effects of treatment Denies recent vaginal bleeding No changes in bladder function or bowel habits  SUMMARY OF ONCOLOGIC HISTORY: Oncology History Overview Note  BRAF neg, HMB-45 is negative   Vaginal cancer ([place])  01/28/2019 Pathology Results   Vagina, biopsy - POORLY DIFFERENTIATED MALIGNANT NEOPLASM. - SEE MICROSCOPIC DESCRIPTION Microscopic Comment The neoplasm is characterized by diffuse sheets of markedly atypical cells with nuclear enlargement and nucleoli. A battery of immunohistochemical stains shows positivity with Melan-A, SOX-10, vimentin and CD117. The tumor is negative with cytokeratin AE1/AE3, cytokeratin 903, cytokeratin 7, cytokeratin 8/18, CD34, CD56, CD45, CD138, cytokeratin 5/6, chromogranin, desmin, epithelial membrane antigen, estrogen receptor, Factor XIII-A, GATA-3, GCDFP, inhibin, muscle specific actin, p63, PAX-8, progesterone receptor, synaptophysin, TTF-1, WT-1, smooth muscle actin, CD10, CD31,  cytokeratin 20 and S100. The morphologic and immunophenotypic differential diagnosis includes malignant melanoma and clear cell sarcoma. Molecular genetic studies can be performed if requested   02/06/2019 Imaging   MRI  pelvis 5.8 cm vaginal soft tissue mass with central necrosis, which shows a focal area of invasion through the inferior left levator ani muscle.   No evidence of pelvic lymphadenopathy.   02/14/2019 Imaging   MRI brain 1. 2.3 cm extra-axial mass along the right aspect of the clivus/posterior clinoid process consistent with a meningioma. 2. No evidence of intracranial metastases. 3. Moderate chronic small vessel ischemic disease.   02/17/2019 PET scan   1. Soft tissue mass within the vagina with central necrosis is again noted and is intensely FDG avid. 2. No specific findings to suggest nodal metastasis or distant metastatic disease. 3. There is a low-density subcutaneous soft tissue nodule within the right side of neck which is FDG avid. The CT appearance favors a sebaceous cyst. If inflamed/infected this may account for the increased uptake. Metastatic disease is less favored but not 100% excluded. Clinical correlation and biopsy if indicated. 4. Gallstones 5.  Aortic Atherosclerosis (ICD10-I70.0).     02/19/2019 Cancer Staging   Staging form: Vagina, AJCC 8th Edition - Clinical: Stage III (cT3, cN0, cM0) - Signed by Heath Lark, MD on 02/19/2019   06/13/2019 PET scan   1. Considerable improvement in size and activity of the vaginal mass, current maximum SUV 6.2, previously 14.8. No findings of adenopathy or metastatic disease at this time. 2. Resolution of the prior hypermetabolic subcutaneous lesion along the right posterior neck, which was probably inflammatory. 3. Other imaging findings of potential clinical significance: Aortic Atherosclerosis (ICD10-I70.0). Mild cardiomegaly. Cholelithiasis.     REVIEW OF SYSTEMS:   Constitutional: Denies fevers, chills or abnormal weight loss Eyes: Denies blurriness of vision Ears, nose, mouth, throat, and face: Denies mucositis or sore throat Respiratory: Denies cough, dyspnea or wheezes Cardiovascular: Denies palpitation, chest discomfort or  lower extremity swelling Gastrointestinal:  Denies nausea, heartburn or change in bowel habits Skin: Denies abnormal skin rashes Lymphatics: Denies new lymphadenopathy or easy bruising Neurological:Denies numbness, tingling or new weaknesses Behavioral/Psych: Mood is stable, no new changes  All other systems were reviewed with the patient and are negative.  I have reviewed the past medical history, past surgical history, social history and family history with the patient and they are unchanged from previous note.  ALLERGIES:  has No Known Allergies.  MEDICATIONS:  Current Outpatient Medications  Medication Sig Dispense Refill  . Calcium Carbonate (CALCIUM 600 PO) Take 2 tablets by mouth daily.     . diclofenac Sodium (VOLTAREN) 1 % GEL Apply 4 g topically 4 (four) times daily. 150 g 4  . fluticasone (FLONASE) 50 MCG/ACT nasal spray Place 1 spray into both nostrils at bedtime as needed for allergies or rhinitis. 16 g 3  . hydrochlorothiazide (HYDRODIURIL) 25 MG tablet Take 1 tablet (25 mg total) by mouth as needed. (Patient not taking: Reported on 06/16/2019) 90 tablet 3  . mirabegron ER (MYRBETRIQ) 25 MG TB24 tablet Take 25 mg by mouth daily.    . Multiple Vitamins-Minerals (MULTIPLE VITAMINS/WOMENS PO) Take 1 tablet by mouth daily.     . Omega-3 Fatty Acids (FISH OIL) 1200 MG CAPS Take 2,400 mg by mouth daily.     No current facility-administered medications for this visit.    PHYSICAL EXAMINATION: ECOG PERFORMANCE STATUS: 0 - Asymptomatic  Vitals:   06/16/19 1236  BP: 134/80  Pulse: Marland Kitchen)  103  Resp: 18  Temp: 97.8 F (36.6 C)  SpO2: 96%   There were no vitals filed for this visit.  GENERAL:alert, no distress and comfortable Musculoskeletal:no cyanosis of digits and no clubbing.  She has stable mild bilateral lower extremity edema  NEURO: alert & oriented x 3 with fluent speech, no focal motor/sensory deficits  LABORATORY DATA:  I have reviewed the data as listed     Component Value Date/Time   NA 143 05/06/2019 1114   K 4.0 05/06/2019 1114   CL 106 05/06/2019 1114   CO2 30 05/06/2019 1114   GLUCOSE 109 (H) 05/06/2019 1114   BUN 9 05/06/2019 1114   CREATININE 0.76 05/06/2019 1114   CREATININE 0.88 02/05/2019 1052   CALCIUM 9.2 05/06/2019 1114   PROT 7.8 06/05/2016 0946   ALBUMIN 4.3 06/05/2016 0946   AST 28 06/05/2016 0946   ALT 20 06/05/2016 0946   ALKPHOS 56 06/05/2016 0946   BILITOT 0.4 06/05/2016 0946   GFRNONAA 60 (L) 12/31/2016 1854   GFRAA >60 12/31/2016 1854    No results found for: SPEP, UPEP  Lab Results  Component Value Date   WBC 6.7 12/31/2016   NEUTROABS 2.8 06/05/2016   HGB 12.6 12/31/2016   HCT 38.4 12/31/2016   MCV 89.9 12/31/2016   PLT 183 12/31/2016      Chemistry      Component Value Date/Time   NA 143 05/06/2019 1114   K 4.0 05/06/2019 1114   CL 106 05/06/2019 1114   CO2 30 05/06/2019 1114   BUN 9 05/06/2019 1114   CREATININE 0.76 05/06/2019 1114   CREATININE 0.88 02/05/2019 1052      Component Value Date/Time   CALCIUM 9.2 05/06/2019 1114   ALKPHOS 56 06/05/2016 0946   AST 28 06/05/2016 0946   ALT 20 06/05/2016 0946   BILITOT 0.4 06/05/2016 0946       RADIOGRAPHIC STUDIES: I have reviewed multiple imaging studies with the patient and her daughter I have personally reviewed the radiological images as listed and agreed with the findings in the report. NM PET Image Restag (PS) Skull Base To Thigh  Result Date: 06/13/2019 CLINICAL DATA:  Subsequent treatment strategy for vaginal adenocarcinoma. EXAM: NUCLEAR MEDICINE PET SKULL BASE TO THIGH TECHNIQUE: 11.4 mCi F-18 FDG was injected intravenously. Full-ring PET imaging was performed from the skull base to thigh after the radiotracer. CT data was obtained and used for attenuation correction and anatomic localization. Fasting blood glucose: 88 mg/dl COMPARISON:  02/17/2019 FINDINGS: Mediastinal blood pool activity: SUV max 2.5 Liver activity: SUV max NA  NECK: The subcutaneous cystic lesion along the right posteromedial neck which was previously hypermetabolic is completely resolved and was likely inflammatory. There is a separate stable hypodensity in the subcutaneous tissues posterolaterally along the occipital region on image 4/4 which is stable and not hypermetabolic compatible with a benign sebaceous cyst or similar benign subcutaneous lesion. Supraglottic activity at the pharyngo esophageal junction is likely physiologic, maximum SUV 8.1, previously 6.8. Incidental CT findings: Bilateral common carotid atherosclerotic calcification. CHEST: No significant abnormal hypermetabolic activity in this region. Incidental CT findings: Mild cardiomegaly. Atherosclerotic calcification of the aortic arch. ABDOMEN/PELVIS: Improved vaginal wall thickening compared to prior although not completely resolved. Cross-sectional area at the level of the mass currently 4.3 by 3.6 cm, previously 5.5 by 4.2 cm. Activity at the site of the previous mass with maximum SUV 6.2, previously 14.8. No new lesion or new adenopathy observed. A small punctate focus of activity  along the anterior margin of the stomach with maximum SUV 4.9 is probably physiologic. Incidental CT findings: Numerous gallstones fill the gallbladder. Aortoiliac atherosclerotic vascular disease. Photopenic left kidney lower pole cyst. SKELETON: No significant abnormal hypermetabolic activity in this region. Incidental CT findings: Endplate sclerosis at U7-9 and L2-3. Degenerative facet arthropathy in the lumbar spine. Old sternal fracture, healed. IMPRESSION: 1. Considerable improvement in size and activity of the vaginal mass, current maximum SUV 6.2, previously 14.8. No findings of adenopathy or metastatic disease at this time. 2. Resolution of the prior hypermetabolic subcutaneous lesion along the right posterior neck, which was probably inflammatory. 3. Other imaging findings of potential clinical significance:  Aortic Atherosclerosis (ICD10-I70.0). Mild cardiomegaly. Cholelithiasis. Electronically Signed   By: Van Clines M.D.   On: 06/13/2019 13:58

## 2019-06-18 NOTE — Assessment & Plan Note (Signed)
We discussed goals of care We review prognosis overall with or without treatment Due to lack of symptoms, she would like to preserve quality of life and I think this is reasonable

## 2019-06-18 NOTE — Assessment & Plan Note (Signed)
This is stable She is not symptomatic She is on diuretic therapy

## 2019-06-18 NOTE — Assessment & Plan Note (Signed)
She denies further vaginal bleeding We will observe closely for now

## 2019-06-19 ENCOUNTER — Telehealth: Payer: Self-pay | Admitting: Hematology and Oncology

## 2019-06-19 NOTE — Telephone Encounter (Signed)
Scheduled per 1/20 sch msg. Called pt no answer and unable to leave msg. Mailing printout

## 2019-06-27 ENCOUNTER — Telehealth: Payer: Self-pay | Admitting: *Deleted

## 2019-06-27 NOTE — Telephone Encounter (Signed)
CALLED PATIENT TO INFORM OF FU APPT. WITH DR. Denman George ON 08-12-19 - ARRIVAL TIME- 2:30 PM, SPOKE WITH PATIENT AND SHE IS AWARE OF THIS APPT

## 2019-07-01 DIAGNOSIS — R3915 Urgency of urination: Secondary | ICD-10-CM | POA: Diagnosis not present

## 2019-07-16 ENCOUNTER — Ambulatory Visit (INDEPENDENT_AMBULATORY_CARE_PROVIDER_SITE_OTHER): Payer: Medicare Other | Admitting: Otolaryngology

## 2019-07-16 ENCOUNTER — Other Ambulatory Visit: Payer: Self-pay | Admitting: Urology

## 2019-07-26 ENCOUNTER — Other Ambulatory Visit: Payer: Self-pay | Admitting: Urology

## 2019-07-29 DIAGNOSIS — R3915 Urgency of urination: Secondary | ICD-10-CM | POA: Diagnosis not present

## 2019-08-08 ENCOUNTER — Ambulatory Visit (INDEPENDENT_AMBULATORY_CARE_PROVIDER_SITE_OTHER): Payer: Medicare Other | Admitting: Podiatry

## 2019-08-08 ENCOUNTER — Encounter: Payer: Self-pay | Admitting: Podiatry

## 2019-08-08 ENCOUNTER — Other Ambulatory Visit: Payer: Self-pay

## 2019-08-08 DIAGNOSIS — M79674 Pain in right toe(s): Secondary | ICD-10-CM

## 2019-08-08 DIAGNOSIS — I739 Peripheral vascular disease, unspecified: Secondary | ICD-10-CM | POA: Diagnosis not present

## 2019-08-08 DIAGNOSIS — M79675 Pain in left toe(s): Secondary | ICD-10-CM | POA: Diagnosis not present

## 2019-08-08 DIAGNOSIS — B351 Tinea unguium: Secondary | ICD-10-CM | POA: Diagnosis not present

## 2019-08-08 DIAGNOSIS — L84 Corns and callosities: Secondary | ICD-10-CM

## 2019-08-08 NOTE — Patient Instructions (Signed)

## 2019-08-12 ENCOUNTER — Inpatient Hospital Stay: Payer: Medicare Other | Attending: Hematology and Oncology | Admitting: Gynecologic Oncology

## 2019-08-12 ENCOUNTER — Other Ambulatory Visit: Payer: Self-pay

## 2019-08-12 ENCOUNTER — Encounter: Payer: Self-pay | Admitting: Gynecologic Oncology

## 2019-08-12 VITALS — BP 159/93 | HR 91 | Temp 98.0°F | Resp 18 | Ht 70.0 in | Wt 222.9 lb

## 2019-08-12 DIAGNOSIS — Z833 Family history of diabetes mellitus: Secondary | ICD-10-CM | POA: Insufficient documentation

## 2019-08-12 DIAGNOSIS — Z90722 Acquired absence of ovaries, bilateral: Secondary | ICD-10-CM | POA: Diagnosis not present

## 2019-08-12 DIAGNOSIS — Z90710 Acquired absence of both cervix and uterus: Secondary | ICD-10-CM | POA: Diagnosis not present

## 2019-08-12 DIAGNOSIS — Z87891 Personal history of nicotine dependence: Secondary | ICD-10-CM | POA: Insufficient documentation

## 2019-08-12 DIAGNOSIS — Z8249 Family history of ischemic heart disease and other diseases of the circulatory system: Secondary | ICD-10-CM | POA: Diagnosis not present

## 2019-08-12 DIAGNOSIS — Z8042 Family history of malignant neoplasm of prostate: Secondary | ICD-10-CM | POA: Insufficient documentation

## 2019-08-12 DIAGNOSIS — C52 Malignant neoplasm of vagina: Secondary | ICD-10-CM | POA: Diagnosis not present

## 2019-08-12 DIAGNOSIS — I10 Essential (primary) hypertension: Secondary | ICD-10-CM | POA: Diagnosis not present

## 2019-08-12 DIAGNOSIS — Z79899 Other long term (current) drug therapy: Secondary | ICD-10-CM | POA: Insufficient documentation

## 2019-08-12 DIAGNOSIS — Z791 Long term (current) use of non-steroidal anti-inflammatories (NSAID): Secondary | ICD-10-CM | POA: Insufficient documentation

## 2019-08-12 DIAGNOSIS — Z803 Family history of malignant neoplasm of breast: Secondary | ICD-10-CM | POA: Diagnosis not present

## 2019-08-12 DIAGNOSIS — E669 Obesity, unspecified: Secondary | ICD-10-CM | POA: Diagnosis not present

## 2019-08-12 DIAGNOSIS — Z9079 Acquired absence of other genital organ(s): Secondary | ICD-10-CM | POA: Insufficient documentation

## 2019-08-12 DIAGNOSIS — Z923 Personal history of irradiation: Secondary | ICD-10-CM | POA: Diagnosis not present

## 2019-08-12 NOTE — Patient Instructions (Signed)
Dr Denman George is recommending a PET scan in April (this has been ordered) before your appointment with Dr Alvy Bimler.  Dr Alvy Bimler will see you in April.  Dr Sondra Come will see you in June.  Please have Dr Calton Dach schedulers contact Dr Serita Grit office (at [phone]) in April after your appointment with her to request an appointment with her for September, 2021.
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
## 2019-08-13 ENCOUNTER — Telehealth: Payer: Self-pay | Admitting: Family Medicine

## 2019-08-13 NOTE — Telephone Encounter (Signed)
I called and spoke with pt. She went to her foot doctor to have her nails cut and that's when she noticed how dark her ankle was. She has an appointment Monday, but will call us before then if it gets any worse.

## 2019-08-13 NOTE — Telephone Encounter (Signed)
Tried calling patient, unable to speak with her.

## 2019-08-13 NOTE — Telephone Encounter (Signed)
Pt would like to speak to a nurse regarding her foot that is bruised around ankle and pt has back pain. Pt states, that her foot at times looks black around ankle. Pt has an appt scheduled on Monday, 08/18/19 with Dr. [name]. Thanks

## 2019-08-14 NOTE — Progress Notes (Signed)
Subjective: Mia Simpson presents today for follow up of at risk foot care. Patient has history of PAD and corn(s) left foot and callus(es) left foot and painful mycotic nails b/l.  Pain interferes with ambulation. Aggravating factors include wearing enclosed shoe gear.   She voices no new pedal problems on today's visit.  No Known Allergies   Objective: There were no vitals filed for this visit.  Pt 86 y.o. year old female  in NAD. AAO x 3.   Vascular Examination:  Capillary refill time to digits immediate b/l. Faintly palpable DP pulses b/l. Nonpalpable PT pulses b/l. Pedal hair absent b/l Skin temperature gradient within normal limits b/l.  Dermatological Examination: Pedal skin is thin shiny, atrophic bilaterally. No open wounds bilaterally. No interdigital macerations bilaterally. Toenails 1-5 b/l elongated, dystrophic, thickened, crumbly with subungual debris and tenderness to dorsal palpation. Hyperkeratotic lesion(s) left 5th toe, left 3rd toe, distal aspect left 2nd digit and submet head 4 left foot.  No erythema, no edema, no drainage, no flocculence.  Musculoskeletal: Normal muscle strength 5/5 to all lower extremity muscle groups bilaterally, no pain crepitus or joint limitation noted with ROM b/l and hammertoes noted to the  2-5 bilaterally  Neurological: Protective sensation intact 5/5 intact bilaterally with 10g monofilament b/l Vibratory sensation intact b/l  Assessment: 1. Pain due to onychomycosis of toenails of both feet   2. Corns and callosities   3. PAD (peripheral artery disease) (HCC)    Plan: -Toenails 1-5 b/l were debrided in length and girth with sterile nail nippers and dremel without iatrogenic bleeding.  -Corns left 5th, left 3rd, left 2nd and submet head 4 left foot and calluses submet head 4 left foot were debrided without complication or incident. Total number debrided =4. -Patient to continue soft, supportive shoe gear daily. -Patient to report any  pedal injuries to medical professional immediately. -Patient/POA to call should there be question/concern in the interim.  Return in about 3 months (around 11/08/2019) for nail trim.

## 2019-08-15 ENCOUNTER — Other Ambulatory Visit: Payer: Self-pay

## 2019-08-18 ENCOUNTER — Ambulatory Visit (INDEPENDENT_AMBULATORY_CARE_PROVIDER_SITE_OTHER): Payer: Medicare Other | Admitting: Family Medicine

## 2019-08-18 ENCOUNTER — Encounter: Payer: Self-pay | Admitting: Family Medicine

## 2019-08-18 ENCOUNTER — Other Ambulatory Visit: Payer: Self-pay | Admitting: Oncology

## 2019-08-18 VITALS — BP 138/90 | HR 96 | Resp 16 | Ht 70.0 in | Wt 222.0 lb

## 2019-08-18 DIAGNOSIS — K5900 Constipation, unspecified: Secondary | ICD-10-CM

## 2019-08-18 DIAGNOSIS — M545 Low back pain, unspecified: Secondary | ICD-10-CM

## 2019-08-18 DIAGNOSIS — I83893 Varicose veins of bilateral lower extremities with other complications: Secondary | ICD-10-CM | POA: Diagnosis not present

## 2019-08-18 DIAGNOSIS — L819 Disorder of pigmentation, unspecified: Secondary | ICD-10-CM | POA: Diagnosis not present

## 2019-08-18 NOTE — Progress Notes (Signed)
Gynecologic Oncology Multi-Disciplinary Disposition Conference Note  Date of the Conference: 08/18/2019  Patient Name: Mia Simpson  Referring Provider: Dr. Phineas Real Primary GYN Oncologist: Dr. 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 Disposition is for PET imaging. Recommendation for chemotherapy if there is progression on the PET scan. PD-L1 will also be ordered.  This Multidisciplinary conference took place involving physicians from [place], [place], Radiation Oncology, Pathology, Radiology along with the Gynecologic Oncology Nurse Practitioner and RN.  Comprehensive assessment of the patient's malignancy, staging, need for surgery, chemotherapy, radiation therapy, and need for further testing were reviewed. Supportive measures, both inpatient and following discharge were also discussed. The recommended plan of care is documented. Greater than 35 minutes were spent correlating and coordinating this patient's care.

## 2019-08-18 NOTE — Patient Instructions (Addendum)
Continue daily Miralax. Also adequate hydration and fiber. Over the counter Benefiber 1 tsp 2 times daily may also help with constipation.  Dark area on leg are most likely caused by varicose veins. Compression stocking will help with prevention of complications.  Back/side pain could be muscle,arthritis. If worse we will need imaging. Icy hot patch on area, local heat,massage may help. Tylenol 500 mg 3 times per day is safe to take.
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
## 2019-08-19 DIAGNOSIS — M25561 Pain in right knee: Secondary | ICD-10-CM | POA: Diagnosis not present

## 2019-09-08 ENCOUNTER — Telehealth: Payer: Self-pay

## 2019-09-08 NOTE — Telephone Encounter (Signed)
Left message for patient to call back  

## 2019-09-08 NOTE — Telephone Encounter (Signed)
Left message with daughter, Bari Mantis, per patient request regarding upcoming appointments.

## 2019-09-11 ENCOUNTER — Encounter (HOSPITAL_COMMUNITY)
Admission: RE | Admit: 2019-09-11 | Discharge: 2019-09-11 | Disposition: A | Discharge status: Home / Self Care | Payer: Medicare Other | Source: Ambulatory Visit | Attending: Gynecologic Oncology | Admitting: Gynecologic Oncology

## 2019-09-11 ENCOUNTER — Other Ambulatory Visit: Payer: Self-pay

## 2019-09-11 ENCOUNTER — Inpatient Hospital Stay: Payer: Medicare Other | Attending: Hematology and Oncology

## 2019-09-11 DIAGNOSIS — C52 Malignant neoplasm of vagina: Secondary | ICD-10-CM | POA: Diagnosis not present

## 2019-09-11 DIAGNOSIS — D696 Thrombocytopenia, unspecified: Secondary | ICD-10-CM | POA: Insufficient documentation

## 2019-09-11 DIAGNOSIS — Z923 Personal history of irradiation: Secondary | ICD-10-CM | POA: Insufficient documentation

## 2019-09-11 DIAGNOSIS — Z79899 Other long term (current) drug therapy: Secondary | ICD-10-CM | POA: Insufficient documentation

## 2019-09-11 DIAGNOSIS — D72819 Decreased white blood cell count, unspecified: Secondary | ICD-10-CM | POA: Insufficient documentation

## 2019-09-11 DIAGNOSIS — K8020 Calculus of gallbladder without cholecystitis without obstruction: Secondary | ICD-10-CM | POA: Insufficient documentation

## 2019-09-11 DIAGNOSIS — N281 Cyst of kidney, acquired: Secondary | ICD-10-CM | POA: Diagnosis not present

## 2019-09-11 LAB — COMPREHENSIVE METABOLIC PANEL
ALT: 9 U/L (ref 0–44)
AST: 18 U/L (ref 15–41)
Albumin: 4 g/dL (ref 3.5–5.0)
Alkaline Phosphatase: 57 U/L (ref 38–126)
Anion gap: 8 (ref 5–15)
BUN: 19 mg/dL (ref 8–23)
CO2: 26 mmol/L (ref 22–32)
Calcium: 9.3 mg/dL (ref 8.9–10.3)
Chloride: 110 mmol/L (ref 98–111)
Creatinine, Ser: 0.9 mg/dL (ref 0.44–1.00)
GFR calc Af Amer: >60 mL/min (ref >60)
GFR calc non Af Amer: 57 mL/min — ABNORMAL LOW (ref >60)
Glucose, Bld: 90 mg/dL (ref 70–99)
Potassium: 4.7 mmol/L (ref 3.5–5.1)
Sodium: 144 mmol/L (ref 135–145)
Total Bilirubin: 0.5 mg/dL (ref 0.3–1.2)
Total Protein: 7.3 g/dL (ref 6.5–8.1)

## 2019-09-11 LAB — CBC WITH DIFFERENTIAL/PLATELET
Abs Immature Granulocytes: 0.01 10*3/uL (ref 0.00–0.07)
Basophils Absolute: 0 10*3/uL (ref 0.0–0.1)
Basophils Relative: 1 %
Eosinophils Absolute: 0.1 10*3/uL (ref 0.0–0.5)
Eosinophils Relative: 2 %
HCT: 39.4 % (ref 36.0–46.0)
Hemoglobin: 12.6 g/dL (ref 12.0–15.0)
Immature Granulocytes: 0 %
Lymphocytes Relative: 16 %
Lymphs Abs: 0.5 10*3/uL — ABNORMAL LOW (ref 0.7–4.0)
MCH: 30.4 pg (ref 26.0–34.0)
MCHC: 32 g/dL (ref 30.0–36.0)
MCV: 95.2 fL (ref 80.0–100.0)
Monocytes Absolute: 0.4 10*3/uL (ref 0.1–1.0)
Monocytes Relative: 12 %
Neutro Abs: 2.2 10*3/uL (ref 1.7–7.7)
Neutrophils Relative %: 69 %
Platelets: 148 10*3/uL — ABNORMAL LOW (ref 150–400)
RBC: 4.14 MIL/uL (ref 3.87–5.11)
RDW: 15.3 % (ref 11.5–15.5)
WBC: 3.3 10*3/uL — ABNORMAL LOW (ref 4.0–10.5)
nRBC: 0 % (ref 0.0–0.2)

## 2019-09-11 LAB — GLUCOSE, CAPILLARY: Glucose-Capillary: 92 mg/dL (ref 70–99)

## 2019-09-15 ENCOUNTER — Ambulatory Visit: Payer: Medicare Other | Admitting: Hematology and Oncology

## 2019-09-15 ENCOUNTER — Other Ambulatory Visit: Payer: Medicare Other

## 2019-09-16 ENCOUNTER — Encounter: Payer: Self-pay | Admitting: Hematology and Oncology

## 2019-09-16 ENCOUNTER — Inpatient Hospital Stay (HOSPITAL_BASED_OUTPATIENT_CLINIC_OR_DEPARTMENT_OTHER): Payer: Medicare Other | Admitting: Hematology and Oncology

## 2019-09-16 ENCOUNTER — Other Ambulatory Visit: Payer: Self-pay

## 2019-09-16 DIAGNOSIS — D696 Thrombocytopenia, unspecified: Secondary | ICD-10-CM | POA: Diagnosis not present

## 2019-09-16 DIAGNOSIS — C52 Malignant neoplasm of vagina: Secondary | ICD-10-CM

## 2019-09-16 DIAGNOSIS — D72819 Decreased white blood cell count, unspecified: Secondary | ICD-10-CM | POA: Diagnosis not present

## 2019-09-16 DIAGNOSIS — D61818 Other pancytopenia: Secondary | ICD-10-CM | POA: Diagnosis not present

## 2019-09-16 NOTE — Assessment & Plan Note (Signed)
She is not anemic The very mild leukopenia and thrombocytopenia seen could be either related to her age or mild bone marrow suppression from prior radiation treatment Observe only for now She does not need further follow-up

## 2019-09-16 NOTE — Assessment & Plan Note (Signed)
I have reviewed multiple imaging studies with the patient and her daughter She has no evidence of active disease on recent imaging She is not symptomatic She has fully recovered from side effects of treatment We discussed future follow-up We also discussed briefly the role of surveillance imaging For now, she has appointment to see radiation oncologist in June I will get her seen by GYN surgeon around September I will be on standby I will see her as needed if she has signs of cancer recurrence, and she is in agreement with the plan

## 2019-09-16 NOTE — Progress Notes (Signed)
[place] OFFICE PROGRESS NOTE  Patient Care Team: [name], [name], MD as PCP - General (Family Medicine) Everitt Amber, MD as Consulting Physician (Gynecologic Oncology)  ASSESSMENT & PLAN:  Vaginal cancer [facility]) I have reviewed multiple imaging studies with the patient and her daughter She has no evidence of active disease on recent imaging She is not symptomatic She has fully recovered from side effects of treatment We discussed future follow-up We also discussed briefly the role of surveillance imaging For now, she has appointment to see radiation oncologist in June I will get her seen by GYN surgeon around September I will be on standby I will see her as needed if she has signs of cancer recurrence, and she is in agreement with the plan  Pancytopenia, acquired [facility]) She is not anemic The very mild leukopenia and thrombocytopenia seen could be either related to her age or mild bone marrow suppression from prior radiation treatment Observe only for now She does not need further follow-up   No orders of the defined types were placed in this encounter.   All questions were answered. The patient knows to call the clinic with any problems, questions or concerns. The total time spent in the appointment was 20 minutes encounter with patients including review of chart and various tests results, discussions about plan of care and coordination of care plan   Heath Lark, MD 09/16/2019 11:20 AM  INTERVAL HISTORY: Please see below for problem oriented charting. She returns for further follow-up for vaginal cancer She has been doing well since last time I saw her No vaginal discharge or bleeding Her appetite is good No recent infection, fever or chills She is feeling well and enjoying life  SUMMARY OF ONCOLOGIC HISTORY: Oncology History Overview Note  BRAF neg, HMB-45 is negative   Vaginal cancer ([place])  01/28/2019 Pathology Results   Vagina, biopsy - POORLY  DIFFERENTIATED MALIGNANT NEOPLASM. - SEE MICROSCOPIC DESCRIPTION Microscopic Comment The neoplasm is characterized by diffuse sheets of markedly atypical cells with nuclear enlargement and nucleoli. A battery of immunohistochemical stains shows positivity with Melan-A, SOX-10, vimentin and CD117. The tumor is negative with cytokeratin AE1/AE3, cytokeratin 903, cytokeratin 7, cytokeratin 8/18, CD34, CD56, CD45, CD138, cytokeratin 5/6, chromogranin, desmin, epithelial membrane antigen, estrogen receptor, Factor XIII-A, GATA-3, GCDFP, inhibin, muscle specific actin, p63, PAX-8, progesterone receptor, synaptophysin, TTF-1, WT-1, smooth muscle actin, CD10, CD31, cytokeratin 20 and S100. The morphologic and immunophenotypic differential diagnosis includes malignant melanoma and clear cell sarcoma. Molecular genetic studies can be performed if requested   02/06/2019 Imaging   MRI pelvis 5.8 cm vaginal soft tissue mass with central necrosis, which shows a focal area of invasion through the inferior left levator ani muscle.   No evidence of pelvic lymphadenopathy.   02/14/2019 Imaging   MRI brain 1. 2.3 cm extra-axial mass along the right aspect of the clivus/posterior clinoid process consistent with a meningioma. 2. No evidence of intracranial metastases. 3. Moderate chronic small vessel ischemic disease.   02/17/2019 PET scan   1. Soft tissue mass within the vagina with central necrosis is again noted and is intensely FDG avid. 2. No specific findings to suggest nodal metastasis or distant metastatic disease. 3. There is a low-density subcutaneous soft tissue nodule within the right side of neck which is FDG avid. The CT appearance favors a sebaceous cyst. If inflamed/infected this may account for the increased uptake. Metastatic disease is less favored but not 100% excluded. Clinical correlation and biopsy if indicated. 4.  Gallstones 5.  Aortic Atherosclerosis (ICD10-I70.0).     02/19/2019 Cancer  Staging   Staging form: Vagina, AJCC 8th Edition - Clinical: Stage III (cT3, cN0, cM0) - Signed by Heath Lark, MD on 02/19/2019   06/13/2019 PET scan   1. Considerable improvement in size and activity of the vaginal mass, current maximum SUV 6.2, previously 14.8. No findings of adenopathy or metastatic disease at this time. 2. Resolution of the prior hypermetabolic subcutaneous lesion along the right posterior neck, which was probably inflammatory. 3. Other imaging findings of potential clinical significance: Aortic Atherosclerosis (ICD10-I70.0). Mild cardiomegaly. Cholelithiasis.     REVIEW OF SYSTEMS:   Constitutional: Denies fevers, chills or abnormal weight loss Eyes: Denies blurriness of vision Ears, nose, mouth, throat, and face: Denies mucositis or sore throat Respiratory: Denies cough, dyspnea or wheezes Cardiovascular: Denies palpitation, chest discomfort or lower extremity swelling Gastrointestinal:  Denies nausea, heartburn or change in bowel habits Skin: Denies abnormal skin rashes Lymphatics: Denies new lymphadenopathy or easy bruising Neurological:Denies numbness, tingling or new weaknesses Behavioral/Psych: Mood is stable, no new changes  All other systems were reviewed with the patient and are negative.  I have reviewed the past medical history, past surgical history, social history and family history with the patient and they are unchanged from previous note.  ALLERGIES:  has No Known Allergies.  MEDICATIONS:  Current Outpatient Medications  Medication Sig Dispense Refill  . Calcium Carbonate (CALCIUM 600 PO) Take 2 tablets by mouth daily.     . diclofenac Sodium (VOLTAREN) 1 % GEL Apply 4 g topically 4 (four) times daily. 150 g 4  . fluticasone (FLONASE) 50 MCG/ACT nasal spray Place 1 spray into both nostrils at bedtime as needed for allergies or rhinitis. 16 g 3  . hydrochlorothiazide (HYDRODIURIL) 25 MG tablet Take 1 tablet (25 mg total) by mouth as needed. 90  tablet 3  . Multiple Vitamins-Minerals (MULTIPLE VITAMINS/WOMENS PO) Take 1 tablet by mouth daily.     . Omega-3 Fatty Acids (FISH OIL) 1200 MG CAPS Take 2,400 mg by mouth daily.    . Trospium Chloride 60 MG CP24 TAKE 1 CAPSULE BY MOUTH EVERY DAY 30 capsule 11   No current facility-administered medications for this visit.    PHYSICAL EXAMINATION: ECOG PERFORMANCE STATUS: 1 - Symptomatic but completely ambulatory  Vitals:   09/16/19 0925  BP: (!) 162/77  Pulse: 85  Resp: 18  Temp: 98.2 F (36.8 C)  SpO2: 98%   Filed Weights   09/16/19 0925  Weight: 221 lb (100.2 kg)    GENERAL:alert, no distress and comfortable NEURO: alert & oriented x 3 with fluent speech, no focal motor/sensory deficits  LABORATORY DATA:  I have reviewed the data as listed    Component Value Date/Time   NA 144 09/11/2019 0950   K 4.7 09/11/2019 0950   CL 110 09/11/2019 0950   CO2 26 09/11/2019 0950   GLUCOSE 90 09/11/2019 0950   BUN 19 09/11/2019 0950   CREATININE 0.90 09/11/2019 0950   CREATININE 0.88 02/05/2019 1052   CALCIUM 9.3 09/11/2019 0950   PROT 7.3 09/11/2019 0950   ALBUMIN 4.0 09/11/2019 0950   AST 18 09/11/2019 0950   ALT 9 09/11/2019 0950   ALKPHOS 57 09/11/2019 0950   BILITOT 0.5 09/11/2019 0950   GFRNONAA 57 (L) 09/11/2019 0950   GFRAA >60 09/11/2019 0950    No results found for: SPEP, UPEP  Lab Results  Component Value Date   WBC 3.3 (L) 09/11/2019  NEUTROABS 2.2 09/11/2019   HGB 12.6 09/11/2019   HCT 39.4 09/11/2019   MCV 95.2 09/11/2019   PLT 148 (L) 09/11/2019      Chemistry      Component Value Date/Time   NA 144 09/11/2019 0950   K 4.7 09/11/2019 0950   CL 110 09/11/2019 0950   CO2 26 09/11/2019 0950   BUN 19 09/11/2019 0950   CREATININE 0.90 09/11/2019 0950   CREATININE 0.88 02/05/2019 1052      Component Value Date/Time   CALCIUM 9.3 09/11/2019 0950   ALKPHOS 57 09/11/2019 0950   AST 18 09/11/2019 0950   ALT 9 09/11/2019 0950   BILITOT 0.5  09/11/2019 0950       RADIOGRAPHIC STUDIES: I have reviewed multiple imaging studies with the patient and her daughter I have personally reviewed the radiological images as listed and agreed with the findings in the report. NM PET Image Restag (PS) Skull Base To Thigh  Result Date: 09/11/2019 CLINICAL DATA:  Subsequent treatment strategy for vaginal carcinoma. EXAM: NUCLEAR MEDICINE PET SKULL BASE TO THIGH TECHNIQUE: 11.0 mCi F-18 FDG was injected intravenously. Full-ring PET imaging was performed from the skull base to thigh after the radiotracer. CT data was obtained and used for attenuation correction and anatomic localization. Fasting blood glucose: 92 mg/dl COMPARISON:  PET-CT 06/13/2019 FINDINGS: Mediastinal blood pool activity: SUV max 2.5 Liver activity: SUV max NA NECK: No hypermetabolic lymph nodes in the neck. Incidental CT findings: none CHEST: No hypermetabolic mediastinal or hilar nodes. No suspicious pulmonary nodules on the CT scan. Incidental CT findings: none ABDOMEN/PELVIS: Focal metabolic activity in the deep LEFT pelvis in the region of the vagina with SUV max equal 5.1 compared SUV max equal 6.2. No clear lesion on noncontrast CT exam in this region of poor soft tissue delineation. No hypermetabolic lymph nodes in the pelvis. No hypermetabolic periaortic nodes.  No liver metastasis. Incidental CT findings: Post hysterectomy. Simple cyst of the LEFT kidney multiple gallstones. SKELETON: Degenerative changes of the lumbar spine. No focal metabolic activity suggest skeletal metastasis. Incidental CT findings: none IMPRESSION: 1. Decreased metabolic activity in the deep LEFT pelvis/vaginal region. 2. No evidence of new or progressive malignant vaginal carcinoma. 3. No evidence of metastatic lymphadenopathy or visceral metastasis. Electronically Signed   By: Suzy Bouchard M.D.   On: 09/11/2019 12:45

## 2019-09-17 ENCOUNTER — Telehealth: Payer: Self-pay | Admitting: Hematology and Oncology

## 2019-09-17 NOTE — Telephone Encounter (Signed)
No new orders per 4/20 los. No changes made to pt's schedule.

## 2019-10-01 ENCOUNTER — Other Ambulatory Visit: Payer: Self-pay

## 2019-10-01 ENCOUNTER — Ambulatory Visit (INDEPENDENT_AMBULATORY_CARE_PROVIDER_SITE_OTHER): Payer: Medicare Other | Admitting: Otolaryngology

## 2019-10-01 ENCOUNTER — Encounter (INDEPENDENT_AMBULATORY_CARE_PROVIDER_SITE_OTHER): Payer: Self-pay | Admitting: Otolaryngology

## 2019-10-01 VITALS — Temp 97.2°F

## 2019-10-01 DIAGNOSIS — H903 Sensorineural hearing loss, bilateral: Secondary | ICD-10-CM | POA: Diagnosis not present

## 2019-10-01 DIAGNOSIS — H6123 Impacted cerumen, bilateral: Secondary | ICD-10-CM | POA: Diagnosis not present
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
## 2019-10-06 ENCOUNTER — Encounter (INDEPENDENT_AMBULATORY_CARE_PROVIDER_SITE_OTHER): Payer: Self-pay

## 2019-10-15 ENCOUNTER — Telehealth: Payer: Self-pay | Admitting: Oncology

## 2019-10-15 NOTE — Telephone Encounter (Signed)
Left a message for [name] (daughter in law) regarding appointment with Dr. Denman George on 02/24/20 at 1:15 pm.

## 2019-11-14 ENCOUNTER — Ambulatory Visit: Payer: Medicare Other | Admitting: Podiatry

## 2019-11-24 ENCOUNTER — Other Ambulatory Visit: Payer: Self-pay

## 2019-11-24 ENCOUNTER — Ambulatory Visit
Admission: RE | Admit: 2019-11-24 | Discharge: 2019-11-24 | Disposition: A | Discharge status: Home / Self Care | Payer: Medicare Other | Source: Ambulatory Visit | Attending: Radiation Oncology | Admitting: Radiation Oncology

## 2019-11-24 ENCOUNTER — Encounter: Payer: Self-pay | Admitting: Radiation Oncology

## 2019-11-24 VITALS — BP 168/83 | HR 84 | Temp 97.1°F | Resp 20 | Ht 70.0 in | Wt 219.6 lb

## 2019-11-24 DIAGNOSIS — C52 Malignant neoplasm of vagina: Secondary | ICD-10-CM

## 2019-11-24 NOTE — Progress Notes (Signed)
Patient denies having any pelvic pain. Patient states mild fatigue with over exertion. Patient denies having diarrhea. Patient denies having any nausea or vomiting. Patient denies having any rectal or any vaginal bleeding. Patient denies having dysuria. Patient states that she is using the dilator 3 times per week as directed. Patient states that she has a good appetite.   BP (!) 168/83 (BP Location: Right Arm, Patient Position: Sitting, Cuff Size: Normal)   Pulse 84   Temp (!) 97.1 F (36.2 C)   Resp 20   Ht 5\' 10"  (1.778 m)   Wt 219 lb 9.6 oz (99.6 kg)   SpO2 99%   BMI 31.51 kg/m

## 2019-11-24 NOTE — Progress Notes (Signed)
Radiation Oncology         [phone] ________________________________  Name: Mia Simpson MRN: [id number]  Date: 11/24/2019  DOB: 04-28-1928  Follow-Up Visit Note  CC: [name], [name], MD  [name], [name], MD    ICD-10-CM   1. Vaginal cancer ([place])  C52     Diagnosis: ClinicalStage III (cT3, cN0, cM0)poorly differentiated melanoma vs sarcoma of unclear primary  Interval Since Last Radiation: Six months, one week, and five days.  Radiation Treatment Dates: 03/10/2019 through 05/14/2019 Site Technique Total Dose (Gy) Dose per Fx (Gy) Completed Fx Beam Energies  Vagina: Pelvis_vagina HDR-brachy 24/24 6 4/4 Ir-192  Vagina: Pelvis IMRT 45/45 1.8 25/25 10X    Narrative:  The patient returns today for routine follow-up. Patient has been doing well overall. Since her last visit, she was seen by Dr. Alvy Bimler on 06/16/2019, during which time they discussed aggressive surveillance imaging versus beginning palliative chemotherapy. The patient had elected to undergo surveillance with repeat imaging in three months.  She was seen by Dr. Denman George on 08/12/2019, during which time she was noted to have a complete clinical response.  Her case was presented to the Gynecologic Oncology Multi-disciplinary Conference on 08/18/2019. At that time, it was recommended that she continue with PET imaging and proceed with chemotherapy if there was progression seen.  PET scan on 09/11/2019 showed decreased metabolic activity in the deep left pelvis/vaginal region. There was no evidence of new or progressive malignant vaginal carcinoma, nor was there any evidence of metastatic lymphadenopathy or visceral metastasis.   She was last seen by Dr. Alvy Bimler on 09/16/2019. Since there was no evidence of active disease on the recent imaging, they discussed as-needed follow-up with medical oncology and continued follow-up with GYN oncology and radiation oncology.  On review of systems, she reports mild fatigue with over  exertion. She denies pelvic pain, vaginal bleeding/dishcarge, nausea, vomiting, diarrhea, rectal bleeding, and dysuria.  She is using her vaginal dilator as recommended.  She denies any bleeding with  use  ALLERGIES:  has No Known Allergies.  Meds: Current Outpatient Medications  Medication Sig Dispense Refill  . Calcium Carbonate (CALCIUM 600 PO) Take 2 tablets by mouth daily.     . diclofenac Sodium (VOLTAREN) 1 % GEL Apply 4 g topically 4 (four) times daily. 150 g 4  . fluticasone (FLONASE) 50 MCG/ACT nasal spray Place 1 spray into both nostrils at bedtime as needed for allergies or rhinitis. 16 g 3  . hydrochlorothiazide (HYDRODIURIL) 25 MG tablet Take 1 tablet (25 mg total) by mouth as needed. 90 tablet 3  . Multiple Vitamins-Minerals (MULTIPLE VITAMINS/WOMENS PO) Take 1 tablet by mouth daily.     . Omega-3 Fatty Acids (FISH OIL) 1200 MG CAPS Take 2,400 mg by mouth daily.    . Trospium Chloride 60 MG CP24 TAKE 1 CAPSULE BY MOUTH EVERY DAY 30 capsule 11   No current facility-administered medications for this encounter.    Physical Findings: The patient is in no acute distress. Patient is alert and oriented.  height is 5\' 10"  (1.778 m) and weight is 219 lb 9.6 oz (99.6 kg). Her temperature is 97.1 F (36.2 C) (abnormal). Her blood pressure is 168/83 (abnormal) and her pulse is 84. Her respiration is 20 and oxygen saturation is 99%.   No significant changes. Lungs are clear to auscultation bilaterally. Heart has regular rate and rhythm. No palpable cervical, supraclavicular, or axillary adenopathy. Abdomen soft, non-tender, normal bowel sounds.  On pelvic examination the external  genitalia were unremarkable. A speculum exam was performed. There are no mucosal lesions noted in the vaginal vault. On bimanual  examination there were no pelvic masses appreciated.   Lab Findings: Lab Results  Component Value Date   WBC 3.3 (L) 09/11/2019   HGB 12.6 09/11/2019   HCT 39.4 09/11/2019    MCV 95.2 09/11/2019   PLT 148 (L) 09/11/2019    Radiographic Findings: No results found.  Impression: ClinicalStage III (cT3, cN0, cM0)poorly differentiated melanoma vs sarcoma of unclear primary  Significant response to external beam and brachytherapy treatments as evidenced by PET scan findings above.   No evidence of persistent disease on pelvic exam,  complete clinical response  Plan: The patient will follow-up with Dr. Denman George on 02/24/2020 and with radiation oncology in six months.   Total time spent in this encounter was 25 minutes which included reviewing the patient's most recent follow-ups, PET scan, physical examination, and documentation.  ____________________________________   Blair Promise, PhD, MD  This document serves as a record of services personally performed by Gery Pray, MD. It was created on his behalf by Clerance Lav, a trained medical scribe. The creation of this record is based on the scribe's personal observations and the provider's statements to them. This document has been checked and approved by the attending provider.

## 2019-11-25 ENCOUNTER — Telehealth: Payer: Self-pay | Admitting: *Deleted

## 2019-11-25 NOTE — Telephone Encounter (Signed)
CALLED PATIENT TO INFORM OF FU WITH DR. KINARD ON 05/24/20, LVM FOR A RETURN CALL

## 2020-01-20 ENCOUNTER — Telehealth: Payer: Self-pay | Admitting: Family Medicine

## 2020-01-20 DIAGNOSIS — R0982 Postnasal drip: Secondary | ICD-10-CM

## 2020-01-20 MED ORDER — FLUTICASONE PROPIONATE 50 MCG/ACT NA SUSP: 1.0000 | Freq: Every evening | NASAL | 3 refills | Status: DC | PRN

## 2020-01-20 NOTE — Telephone Encounter (Signed)
Patient needs a refill on fluticasone nasal spray.  Pharmacy- [facility] on [address]  Patient states circulation is getting better with the compression stockings.  She says she needs something for arthritis in the same knee (Left).  Patient says she feels shaky when she tries to walk.

## 2020-01-22 NOTE — Telephone Encounter (Signed)
Rx was sent on 01/20/20. Zahid Carneiro [name], MD

## 2020-02-09 DIAGNOSIS — R3915 Urgency of urination: Secondary | ICD-10-CM | POA: Diagnosis not present

## 2020-02-09 DIAGNOSIS — R311 Benign essential microscopic hematuria: Secondary | ICD-10-CM | POA: Diagnosis not present

## 2020-02-24 ENCOUNTER — Other Ambulatory Visit: Payer: Self-pay

## 2020-02-24 ENCOUNTER — Encounter: Payer: Self-pay | Admitting: Gynecologic Oncology

## 2020-02-24 ENCOUNTER — Inpatient Hospital Stay: Payer: Medicare Other | Attending: Gynecologic Oncology | Admitting: Gynecologic Oncology

## 2020-02-24 VITALS — BP 153/74 | HR 88 | Temp 97.8°F | Resp 18 | Wt 217.2 lb

## 2020-02-24 DIAGNOSIS — Z791 Long term (current) use of non-steroidal anti-inflammatories (NSAID): Secondary | ICD-10-CM | POA: Diagnosis not present

## 2020-02-24 DIAGNOSIS — I10 Essential (primary) hypertension: Secondary | ICD-10-CM | POA: Insufficient documentation

## 2020-02-24 DIAGNOSIS — M1990 Unspecified osteoarthritis, unspecified site: Secondary | ICD-10-CM | POA: Diagnosis not present

## 2020-02-24 DIAGNOSIS — Z923 Personal history of irradiation: Secondary | ICD-10-CM | POA: Diagnosis not present

## 2020-02-24 DIAGNOSIS — Z79899 Other long term (current) drug therapy: Secondary | ICD-10-CM | POA: Diagnosis not present

## 2020-02-24 DIAGNOSIS — R3915 Urgency of urination: Secondary | ICD-10-CM | POA: Diagnosis not present

## 2020-02-24 DIAGNOSIS — Z87891 Personal history of nicotine dependence: Secondary | ICD-10-CM | POA: Diagnosis not present

## 2020-02-24 DIAGNOSIS — E669 Obesity, unspecified: Secondary | ICD-10-CM | POA: Insufficient documentation

## 2020-02-24 DIAGNOSIS — C52 Malignant neoplasm of vagina: Secondary | ICD-10-CM | POA: Insufficient documentation
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
## 2020-02-24 NOTE — Patient Instructions (Signed)
Please notify Dr Denman George at phone number [phone] if you notice vaginal bleeding, new pelvic or abdominal pains, bloating, feeling full easy, or a change in bladder or bowel function.   Please have Dr Clabe Seal office contact Dr Serita Grit office (at [phone]) in December after your appointment with him to request an appointment with Dr Denman George for March, 2022.

## 2020-04-05 ENCOUNTER — Other Ambulatory Visit: Payer: Self-pay | Admitting: Family Medicine

## 2020-04-05 DIAGNOSIS — R0982 Postnasal drip: Secondary | ICD-10-CM

## 2020-05-05 ENCOUNTER — Ambulatory Visit (INDEPENDENT_AMBULATORY_CARE_PROVIDER_SITE_OTHER): Payer: Medicare Other | Admitting: Family Medicine

## 2020-05-05 ENCOUNTER — Other Ambulatory Visit: Payer: Self-pay

## 2020-05-05 ENCOUNTER — Encounter: Payer: Self-pay | Admitting: Family Medicine

## 2020-05-05 VITALS — BP 160/84 | HR 86 | Temp 97.8°F | Resp 16 | Ht 70.0 in | Wt 222.6 lb

## 2020-05-05 DIAGNOSIS — E559 Vitamin D deficiency, unspecified: Secondary | ICD-10-CM

## 2020-05-05 DIAGNOSIS — Z0000 Encounter for general adult medical examination without abnormal findings: Secondary | ICD-10-CM

## 2020-05-05 DIAGNOSIS — M67441 Ganglion, right hand: Secondary | ICD-10-CM

## 2020-05-05 DIAGNOSIS — M1712 Unilateral primary osteoarthritis, left knee: Secondary | ICD-10-CM

## 2020-05-05 DIAGNOSIS — W19XXXA Unspecified fall, initial encounter: Secondary | ICD-10-CM | POA: Diagnosis not present

## 2020-05-05 DIAGNOSIS — R0982 Postnasal drip: Secondary | ICD-10-CM | POA: Diagnosis not present

## 2020-05-05 DIAGNOSIS — I10 Essential (primary) hypertension: Secondary | ICD-10-CM | POA: Diagnosis not present

## 2020-05-05 DIAGNOSIS — Z0001 Encounter for general adult medical examination with abnormal findings: Secondary | ICD-10-CM

## 2020-05-05 DIAGNOSIS — Y92009 Unspecified place in unspecified non-institutional (private) residence as the place of occurrence of the external cause: Secondary | ICD-10-CM | POA: Diagnosis not present

## 2020-05-05 MED ORDER — FLUTICASONE PROPIONATE 50 MCG/ACT NA SUSP: 1.0000 | Freq: Every evening | NASAL | 3 refills | Status: DC | PRN

## 2020-05-05 MED ORDER — DICLOFENAC SODIUM 1 % EX GEL: 4.0000 g | Freq: Four times a day (QID) | CUTANEOUS | 4 refills | Status: DC

## 2020-05-05 NOTE — Assessment & Plan Note (Signed)
Recheck: 150/80. For now recommend low-salt diet. Instructed to monitor BP regularly at home. If BP persistently > 140/90, we will consider amlodipine 2.5 mg daily.

## 2020-05-05 NOTE — Assessment & Plan Note (Signed)
We discussed diagnosis, prognosis, and treatment options. Tylenol 500 mg 3-4 times per day recommended. Voltaren gel topically on affected joint 4 times daily. Instructed to avoid activities that could aggravate pain. Fall precautions.
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
## 2020-05-05 NOTE — Patient Instructions (Addendum)
  Mia Simpson , Thank you for taking time to come for your Medicare Wellness Visit. I appreciate your ongoing commitment to your health goals. Please review the following plan we discussed and let me know if I can assist you in the future.   These are the goals we discussed: Goals   None     This is a list of the screening recommended for you and due dates:  Health Maintenance  Topic Date Due  . COVID-19 Vaccine (2 - Pfizer 2-dose series) 04/02/2020  . Flu Shot  Completed  . DEXA scan (bone density measurement)  Completed  . Pneumonia vaccines  Completed  . Tetanus Vaccine  Discontinued  A few things to remember from today's visit:   Vitamin D deficiency, unspecified  Benign essential HTN  Fall in home, initial encounter  Osteoarthritis of left knee, unspecified osteoarthritis type  Medicare annual wellness visit, subsequent  Adhesive capsulitis of left shoulder - Plan: diclofenac Sodium (VOLTAREN) 1 % GEL  If you need refills please call your pharmacy. Do not use My Chart to request refills or for acute issues that need immediate attention.   Monitor blood pressure at home. Tylenol 500 mg 3-4 times per day for knee pain. Voltaren gel on knee.  Please be sure medication list is accurate. If a new problem present, please set up appointment sooner than planned today.  A few tips:  -As we age balance is not as good as it was, so there is a higher risks for falls. Please remove small rugs and furniture that is "in your way" and could increase the risk of falls. Stretching exercises may help with fall prevention: Yoga and Tai Chi are some examples. Low impact exercise is better, so you are not very achy the next day.  -Sun screen and avoidance of direct sun light recommended. Caution with dehydration, if working outdoors be sure to drink enough fluids.  - Some medications are not safe as we age, increases the risk of side effects and can potentially interact with other  medication you are also taken;  including some of over the counter medications. Be sure to let me know when you start a new medication even if it is a dietary/vitamin supplement.   -Healthy diet low in red meet/animal fat and sugar + regular physical activity is recommended.

## 2020-05-06 ENCOUNTER — Telehealth: Payer: Self-pay | Admitting: Radiation Oncology

## 2020-05-06 NOTE — Telephone Encounter (Signed)
Patient wanted an earlier-in-the-day appointment for her 12/27 follow up appt w/Dr. Sondra Come. I have sent him and nurse Barbra Sarks a message to see what we can do.

## 2020-05-07 ENCOUNTER — Other Ambulatory Visit: Payer: Self-pay | Admitting: Family Medicine

## 2020-05-15 ENCOUNTER — Other Ambulatory Visit: Payer: Self-pay | Admitting: Family Medicine

## 2020-05-15 DIAGNOSIS — M1712 Unilateral primary osteoarthritis, left knee: Secondary | ICD-10-CM

## 2020-05-23 NOTE — Progress Notes (Signed)
Radiation Oncology         [phone] ________________________________  Name: Mia Simpson MRN: [id number]  Date: 05/24/2020  DOB: 1927/11/01  Follow-Up Visit Note  CC: [name], [name], MD  [name], [name], MD    ICD-10-CM   1. Vaginal cancer ([place])  C52     Diagnosis: ClinicalStage III (cT3, cN0, cM0)poorly differentiated melanoma vs sarcoma of unclear primary  Interval Since Last Radiation: One year, one week, and four days  Radiation Treatment Dates: 03/10/2019 through 05/14/2019 Site Technique Total Dose (Gy) Dose per Fx (Gy) Completed Fx Beam Energies  Vagina: Pelvis_vagina HDR-brachy 24/24 6 4/4 Ir-192  Vagina: Pelvis IMRT 45/45 1.8 25/25 10X    Narrative:  The patient returns today for routine follow-up. Since her last visit, she was seen by Dr. Denman George on 02/24/2020 and was noted to have a complete clinical response. PET scan in April of 2022 was recommended to monitor for continued resolution of avid tissue.  On review of systems, she reports ongoing problems with arthritis. She denies vaginal bleeding or pelvic pain she continues to use her vaginal dilator as recommended.  ALLERGIES:  has No Known Allergies.  Meds: Current Outpatient Medications  Medication Sig Dispense Refill  . Calcium Carbonate (CALCIUM 600 PO) Take 2 tablets by mouth daily.     . diclofenac Sodium (VOLTAREN) 1 % GEL APPLY 4 G TOPICALLY 4 (FOUR) TIMES DAILY. 200 g 4  . fluticasone (FLONASE) 50 MCG/ACT nasal spray Place 1 spray into both nostrils at bedtime as needed for allergies or rhinitis. 16 mL 3  . Multiple Vitamins-Minerals (MULTIPLE VITAMINS/WOMENS PO) Take 1 tablet by mouth daily.     . Omega-3 Fatty Acids (FISH OIL) 1200 MG CAPS Take 2,400 mg by mouth daily.    . Trospium Chloride 60 MG CP24 TAKE 1 CAPSULE BY MOUTH EVERY DAY 30 capsule 11   No current facility-administered medications for this encounter.    Physical Findings: The patient is in no acute distress. Patient is alert  and oriented.  height is 5\' 10"  (1.778 m) and weight is 226 lb 3.2 oz (102.6 kg). Her temperature is 98.6 F (37 C). Her blood pressure is 159/80 (abnormal) and her pulse is 82. Her respiration is 18 and oxygen saturation is 100%.    Lungs are clear to auscultation bilaterally. Heart has regular rate and rhythm. No palpable cervical, supraclavicular, or axillary adenopathy. Abdomen soft, non-tender, normal bowel sounds.  On pelvic examination the external genitalia were unremarkable. A speculum exam was performed. There are no mucosal lesions noted in the vaginal vault. On bimanual  examination there were no pelvic masses appreciated.   Lab Findings: Lab Results  Component Value Date   WBC 3.3 (L) 09/11/2019   HGB 12.6 09/11/2019   HCT 39.4 09/11/2019   MCV 95.2 09/11/2019   PLT 148 (L) 09/11/2019    Radiographic Findings: No results found.  Impression: ClinicalStage III (cT3, cN0, cM0)poorly differentiated melanoma vs sarcoma of unclear primary  No evidence of recurrence on clinical exam today.   Plan: The patient will follow-up with Dr. Denman George in three months and with radiation oncology in six months. PET scan is planned for April of 2022.  Total time spent in this encounter was 25 minutes which included reviewing the patient's most recent follow-up with Dr. Denman George, physical examination, and documentation.  ____________________________________   Blair Promise, PhD, MD  This document serves as a record of services personally performed by Gery Pray, MD. It was  created on his behalf by Clerance Lav, a trained medical scribe. The creation of this record is based on the scribe's personal observations and the provider's statements to them. This document has been checked and approved by the attending provider.

## 2020-05-24 ENCOUNTER — Ambulatory Visit
Admission: RE | Admit: 2020-05-24 | Discharge: 2020-05-24 | Disposition: A | Discharge status: Home / Self Care | Payer: Medicare Other | Source: Ambulatory Visit | Attending: Radiation Oncology | Admitting: Radiation Oncology

## 2020-05-24 ENCOUNTER — Encounter: Payer: Self-pay | Admitting: Radiation Oncology

## 2020-05-24 ENCOUNTER — Other Ambulatory Visit: Payer: Self-pay

## 2020-05-24 DIAGNOSIS — Z8544 Personal history of malignant neoplasm of other female genital organs: Secondary | ICD-10-CM | POA: Insufficient documentation

## 2020-05-24 DIAGNOSIS — Z08 Encounter for follow-up examination after completed treatment for malignant neoplasm: Secondary | ICD-10-CM | POA: Diagnosis not present

## 2020-05-24 DIAGNOSIS — C52 Malignant neoplasm of vagina: Secondary | ICD-10-CM

## 2020-05-24 NOTE — Progress Notes (Signed)
Patient here for a 1 year follow up visit with Dr. Roselind Messier. Patient denies any pain, bleeding, GU or GI problems. She reports intermittent leg pain from arthritis. Patient continues to use her dilator 3 times a week.   BP (!) 159/80 (BP Location: Right Arm, Patient Position: Sitting, Cuff Size: Large)   Pulse 82   Temp 98.6 F (37 C)   Resp 18   Ht 5\' 10"  (1.778 m)   Wt 226 lb 3.2 oz (102.6 kg)   SpO2 100%   BMI 32.46 kg/m   Wt Readings from Last 3 Encounters:  05/24/20 226 lb 3.2 oz (102.6 kg)  05/05/20 222 lb 9.6 oz (101 kg)  02/24/20 217 lb 3.2 oz (98.5 kg)

## 2020-06-03 ENCOUNTER — Telehealth: Payer: Self-pay | Admitting: Family Medicine

## 2020-06-03 NOTE — Telephone Encounter (Signed)
She was not taking HCTZ , so taken from medication list. She was instructed to monitor BP at home. Thanks, BJ

## 2020-06-03 NOTE — Telephone Encounter (Signed)
Patient is calling and requesting a refill for hydrochlorothiazide 25 mg sent to [id number] [place],  [address] [place] [address]  Phone:  [phone] Fax:  [phone] CB is [phone]

## 2020-06-04 NOTE — Telephone Encounter (Signed)
Left a message for the pt to return a call to the office.   

## 2020-06-10 DIAGNOSIS — M1712 Unilateral primary osteoarthritis, left knee: Secondary | ICD-10-CM | POA: Diagnosis not present

## 2020-06-17 DIAGNOSIS — M1712 Unilateral primary osteoarthritis, left knee: Secondary | ICD-10-CM | POA: Diagnosis not present

## 2020-06-24 DIAGNOSIS — M79644 Pain in right finger(s): Secondary | ICD-10-CM | POA: Diagnosis not present

## 2020-06-24 DIAGNOSIS — M1712 Unilateral primary osteoarthritis, left knee: Secondary | ICD-10-CM | POA: Diagnosis not present

## 2020-06-29 ENCOUNTER — Telehealth: Payer: Self-pay | Admitting: *Deleted

## 2020-06-29 MED ORDER — HYDROCHLOROTHIAZIDE 25 MG PO TABS: 25.0000 mg | ORAL_TABLET | Freq: Every day | ORAL | 1 refills | Status: DC

## 2020-06-29 NOTE — Telephone Encounter (Signed)
Enid Derry from radiation called and scheduled the patient for a follow up appt in March. Appt scheduled and Enid Derry will contact the patient

## 2020-06-29 NOTE — Telephone Encounter (Signed)
Called patient to inform of fu with Dr. Denman George on 08-06-20 - arrival time- 1:30 pm, spoke with patient and she is aware of this appt.

## 2020-07-16 DIAGNOSIS — R311 Benign essential microscopic hematuria: Secondary | ICD-10-CM | POA: Diagnosis not present

## 2020-07-16 DIAGNOSIS — R3915 Urgency of urination: Secondary | ICD-10-CM | POA: Diagnosis not present

## 2020-07-20 ENCOUNTER — Other Ambulatory Visit: Payer: Self-pay

## 2020-07-20 ENCOUNTER — Telehealth: Payer: Self-pay

## 2020-07-20 ENCOUNTER — Encounter: Payer: Self-pay | Admitting: Oncology

## 2020-07-20 ENCOUNTER — Encounter: Payer: Self-pay | Admitting: Radiation Oncology

## 2020-07-20 ENCOUNTER — Ambulatory Visit
Admission: RE | Admit: 2020-07-20 | Discharge: 2020-07-20 | Disposition: A | Discharge status: Home / Self Care | Payer: Medicare Other | Source: Ambulatory Visit | Attending: Radiation Oncology | Admitting: Radiation Oncology

## 2020-07-20 VITALS — BP 150/80 | HR 85 | Temp 97.3°F | Resp 18 | Wt 226.4 lb

## 2020-07-20 DIAGNOSIS — Z79899 Other long term (current) drug therapy: Secondary | ICD-10-CM | POA: Diagnosis not present

## 2020-07-20 DIAGNOSIS — C52 Malignant neoplasm of vagina: Secondary | ICD-10-CM | POA: Diagnosis not present

## 2020-07-20 DIAGNOSIS — N939 Abnormal uterine and vaginal bleeding, unspecified: Secondary | ICD-10-CM | POA: Insufficient documentation

## 2020-07-20 DIAGNOSIS — N898 Other specified noninflammatory disorders of vagina: Secondary | ICD-10-CM

## 2020-07-20 HISTORY — DX: Personal history of irradiation: Z92.3

## 2020-07-20 NOTE — Progress Notes (Signed)
Patient is here today for follow up for bleeding.  She reports she went to urologist 07/16/2020 and they found some blood in her urine.  She states that she has been bleeding approximately 1 Tablespoon at a time.  She reports she uses the vaginal dilator three times a week routinely.  She admits to inserting the full length of the dilator into the vagina and repeatedly turns the dilator 100 turns.  She does not experience any pain with the use of the vaginal dilator.  She is concerned she is using the dilator incorrectly and may be harming herself.  She thought she was helping the doctor with the use of the dilator.   She has stopped using the dilator since 07/16/2020 she is still seeing blood.  Worse with lifting.   Vitals:   07/20/20 1242  BP: (!) 150/80  Pulse: 85  Resp: 18  Temp: (!) 97.3 F (36.3 C)  TempSrc: Tympanic  SpO2: 99%  Weight: 226 lb 6.4 oz (102.7 kg)

## 2020-07-20 NOTE — Progress Notes (Signed)
Radiation Oncology         [phone] ________________________________  Name: Mia Simpson MRN: [id number]  Date: 07/20/2020  DOB: Nov 24, 1927  Follow-Up Visit Note  CC: [name], [name], MD  [name], [name], MD    ICD-10-CM   1. Vaginal cancer ([place])  C52     Diagnosis: ClinicalStage III (cT3, cN0, cM0)poorly differentiated melanoma vs sarcoma of unclear primary  Interval Since Last Radiation: One year, two months, and six days  Radiation Treatment Dates: 03/10/2019 through 05/14/2019 Site Technique Total Dose (Gy) Dose per Fx (Gy) Completed Fx Beam Energies  Vagina: Pelvis_vagina HDR-brachy 24/24 6 4/4 Ir-192  Vagina: Pelvis IMRT 45/45 1.8 25/25 10X    Narrative:  The patient returns today for evaluation of new onset vaginal bleeding. She reports using her vaginal dilator there times a week routinely. She denies pain when using the vaginal dilator.  She reports the first time she noticed some blood was when she visited the urologist recently.  Upon dressing she noticed some blood in the vaginal area.  I should mention that the patient did not have a pelvic exam that day.  The patient has continued to have problems with vaginal bleeding but none severe.  Given these findings I recommend the patient come in for examination.  ALLERGIES:  has No Known Allergies.  Meds: Current Outpatient Medications  Medication Sig Dispense Refill  . Calcium Carbonate (CALCIUM 600 PO) Take 2 tablets by mouth daily.     . diclofenac Sodium (VOLTAREN) 1 % GEL APPLY 4 Simpson TOPICALLY 4 (FOUR) TIMES DAILY. 200 Simpson 4  . fluticasone (FLONASE) 50 MCG/ACT nasal spray Place 1 spray into both nostrils at bedtime as needed for allergies or rhinitis. 16 mL 3  . hydrochlorothiazide (HYDRODIURIL) 25 MG tablet Take 1 tablet (25 mg total) by mouth daily. 90 tablet 1  . Multiple Vitamins-Minerals (MULTIPLE VITAMINS/WOMENS PO) Take 1 tablet by mouth daily.     . Omega-3 Fatty Acids (FISH OIL) 1200 MG CAPS Take 2,400 mg  by mouth daily.    . Trospium Chloride 60 MG CP24 TAKE 1 CAPSULE BY MOUTH EVERY DAY 30 capsule 11   No current facility-administered medications for this encounter.    Physical Findings: The patient is in no acute distress. Patient is alert and oriented.  weight is 226 lb 6.4 oz (102.7 kg). Her tympanic temperature is 97.3 F (36.3 C) (abnormal). Her blood pressure is 150/80 (abnormal) and her pulse is 85. Her respiration is 18 and oxygen saturation is 99%.    Lungs are clear to auscultation bilaterally. Heart has regular rate and rhythm. No palpable cervical, supraclavicular, or axillary adenopathy. Abdomen soft, non-tender, normal bowel sounds.  On pelvic examination the external genitalia were unremarkable. A speculum exam was performed.  Exam revealed along the left mid vaginal area a fleshy erythematous colored lesion which bled easily.  This was estimated to be approximately 1/2 to 2 cm and did not seem to be deeply infiltrating.  Was also noted to be some thickening along the posterior vaginal wall in the mid vaginal area but no obvious tumor in this region.  Given this finding on exam today with concern for recurrence I contacted Dr. Denman George and she graciously was able to examine and biopsy the patient.  From her exam she was also concerned that the patient had developed a recurrence.  Some of this tissue was removed in 2 separate specimens and was processed for pathologic review.  She had some bleeding but  no significant bleeding that required packing or application of Monsel's. we recommended she continue wearing a pad and to call if she experienced significant vaginal bleeding requiring vaginal packing.  Lab Findings: Lab Results  Component Value Date   WBC 3.3 (L) 09/11/2019   HGB 12.6 09/11/2019   HCT 39.4 09/11/2019   MCV 95.2 09/11/2019   PLT 148 (L) 09/11/2019    Radiographic Findings: No results found.  Impression: ClinicalStage III (cT3, cN0, cM0)poorly differentiated  melanoma vs sarcoma of unclear primary  Exam today is very concerning for recurrence.  Plan: Pathology should be available late this week and her case will be presented at the multidisciplinary gynecologic oncology conference on Monday, February 28.  Given her previous high-dose radiation therapy the patient would not be a candidate for additional radiation therapy but may be a potential candidate for chemotherapy.   Depending on pathology findings, she may have her PET scan moved up.  Total time spent in this encounter was 25 minutes which included reviewing the patient's most recent interval history, physical examination, and documentation.  ____________________________________   Blair Promise, PhD, MD  This document serves as a record of services personally performed by Gery Pray, MD. It was created on his behalf by Clerance Lav, a trained medical scribe. The creation of this record is based on the scribe's personal observations and the provider's statements to them. This document has been checked and approved by the attending provider.

## 2020-07-20 NOTE — Telephone Encounter (Signed)
Told Ms Asbill that Dr. Sondra Come can see her today at 1 pm to evaluate the vaginal bleeding.  She needs to check in at 1245 to go through registration. Pt verbalized understanding.

## 2020-07-21 ENCOUNTER — Encounter: Payer: Self-pay | Admitting: Oncology

## 2020-07-21 ENCOUNTER — Telehealth: Payer: Self-pay | Admitting: Oncology

## 2020-07-21 DIAGNOSIS — C52 Malignant neoplasm of vagina: Secondary | ICD-10-CM

## 2020-07-21 LAB — SURGICAL PATHOLOGY

## 2020-07-21 NOTE — Telephone Encounter (Signed)
Mia Simpson with apt for PET scan on 08/03/20.  She said they can't make it that day.  Rescheduled to 08/04/20 at 10:00 (water only 6 hours prior).  She verbalized understanding and agreement of appointment and instructions.

## 2020-07-22 ENCOUNTER — Telehealth: Payer: Self-pay | Admitting: Oncology

## 2020-07-22 NOTE — Telephone Encounter (Signed)
Left a message regarding biopsy results.  Requested a return call. 

## 2020-07-23 NOTE — Telephone Encounter (Signed)
Called Mia Simpson and advised her of biopsy results and plan to discuss her case at the [facility] on Monday for treatment recommendations.  She asked that I call [name] as well.  Called [name] and discussed the biopsy results and advised her that I will call her Monday to advise her of the recommendations from the [facility].  She verbalized understanding and agreement.

## 2020-07-26 ENCOUNTER — Other Ambulatory Visit: Payer: Self-pay | Admitting: Oncology

## 2020-07-26 ENCOUNTER — Telehealth: Payer: Self-pay | Admitting: Oncology

## 2020-07-26 NOTE — Progress Notes (Signed)
Gynecologic Oncology Multi-Disciplinary Disposition Conference Note  Date of the Conference: 07/26/2020  Patient Name: Mia Simpson  Referring Provider: Dr. Fontaine Primary GYN Oncologist: Dr. 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 Disposition is for a PET scan followed by palliative intent chemotherapy vs hospice.  Also consideration for one palliative brachytherapy treatment. PD-L1 testing also requested.  This Multidisciplinary conference took place involving physicians from Gynecologic Oncology, Medical Oncology, Radiation Oncology, Pathology, Radiology along with the Gynecologic Oncology Nurse Practitioner and RN.  Comprehensive assessment of the patient's malignancy, staging, need for surgery, chemotherapy, radiation therapy, and need for further testing were reviewed. Supportive measures, both inpatient and following discharge were also discussed. The recommended plan of care is documented. Greater than 35 minutes were spent correlating and coordinating this patient's care.  

## 2020-07-26 NOTE — Telephone Encounter (Signed)
Called Mia Simpson and Mia Simpson and advised them of the GYN Cancer Conference recommendations for palliative chemotherapy vs hospice.  Asked if they can see Dr. Alvy Bimler on 08/05/20 to discuss.  They were not able to make it that day so the appointment scheduled on 08/06/20 at 1:40.

## 2020-08-03 ENCOUNTER — Ambulatory Visit (HOSPITAL_COMMUNITY): Payer: Medicare Other

## 2020-08-04 ENCOUNTER — Other Ambulatory Visit: Payer: Self-pay

## 2020-08-04 ENCOUNTER — Ambulatory Visit (HOSPITAL_COMMUNITY)
Admission: RE | Admit: 2020-08-04 | Discharge: 2020-08-04 | Disposition: A | Discharge status: Home / Self Care | Payer: Medicare Other | Source: Ambulatory Visit | Attending: Radiation Oncology | Admitting: Radiation Oncology

## 2020-08-04 DIAGNOSIS — K8020 Calculus of gallbladder without cholecystitis without obstruction: Secondary | ICD-10-CM | POA: Insufficient documentation

## 2020-08-04 DIAGNOSIS — C52 Malignant neoplasm of vagina: Secondary | ICD-10-CM | POA: Insufficient documentation

## 2020-08-04 DIAGNOSIS — I700 Atherosclerosis of aorta: Secondary | ICD-10-CM | POA: Diagnosis not present

## 2020-08-04 LAB — GLUCOSE, CAPILLARY: Glucose-Capillary: 84 mg/dL (ref 70–99)

## 2020-08-04 IMAGING — PT NM PET TUM IMG INITIAL (PI) SKULL BASE T - THIGH
1 of 9 series · 1 of 25 positions shown · non-contrast
Comparison: Pelvic MRI 02/06/2019.

CLINICAL DATA: Initial treatment strategy for adenocarcinoma within
the vagina of unknown origin. Evaluate for primary site of disease
and additional sites of metastasis.

EXAM:
NUCLEAR MEDICINE PET SKULL BASE TO THIGH
TECHNIQUE: 12.29 mCi F-18 FDG was injected intravenously. Full-ring PET imaging
was performed from the skull base to thigh after the radiotracer. CT
data was obtained and used for attenuation correction and anatomic
localization.
Fasting blood glucose: 94 mg/dl

[Series 3: pet sk_thigh ac · axial · 5.0mm · 4.07mm/px · 1 of 206 slices shown]
[im 137/206]
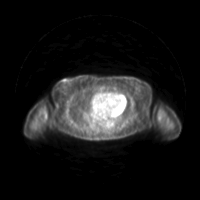

[1 of 25 positions shown; findings below may reference images not displayed]

FINDINGS: Mediastinal blood pool activity: SUV max

Liver activity: SUV max NA

NECK: Within the soft tissues of the right posterior neck there is a
low-density subcutaneous nodule measuring 1.2 cm and has an SUV max
of 9.23. No FDG avid lymph nodes within soft tissues of the neck.

Incidental CT findings: none

CHEST: No hypermetabolic mediastinal or hilar nodes. No suspicious
pulmonary nodules on the CT scan. Aortic atherosclerosis. No
pericardial effusion.

Incidental CT findings: none

ABDOMEN/PELVIS: No abnormal FDG uptake identified within the liver,
pancreas, spleen, or adrenal glands. No FDG avid lymph nodes within
the abdomen.

Necrotic soft tissue mass within the vagina measures approximately
4.6 x 4.9 cm and has an SUV max of 14.79. No FDG avid pelvic or
inguinal lymph nodes.

No ascites identified within the abdomen or pelvis. No FDG avid
peritoneal nodule or mass.

Incidental CT findings: Gallstones. Aortic atherosclerosis. Left
kidney cysts.

SKELETON: No focal hypermetabolic activity to suggest skeletal
metastasis.

Incidental CT findings: none
IMPRESSION: 1. Soft tissue mass within the vagina with central necrosis is again
noted and is intensely FDG avid.
2. No specific findings to suggest nodal metastasis or distant
metastatic disease.
3. There is a low-density subcutaneous soft tissue nodule within the
right side of neck which is FDG avid. The CT appearance favors a
sebaceous cyst. If inflamed/infected this may account for the
increased uptake. Metastatic disease is less favored but not 100%
excluded. Clinical correlation and biopsy if indicated.
4. Gallstones
5.  Aortic Atherosclerosis ([id number]).

## 2020-08-04 MED ORDER — FLUDEOXYGLUCOSE F - 18 (FDG) INJECTION
12.0000 | Freq: Once | INTRAVENOUS | Status: AC
  Administered 2020-08-04: 11.29 via INTRAVENOUS

## 2020-08-05 ENCOUNTER — Ambulatory Visit: Payer: Medicare Other | Admitting: Hematology and Oncology

## 2020-08-06 ENCOUNTER — Other Ambulatory Visit: Payer: Self-pay

## 2020-08-06 ENCOUNTER — Encounter: Payer: Self-pay | Admitting: Hematology and Oncology

## 2020-08-06 ENCOUNTER — Inpatient Hospital Stay: Payer: Medicare Other | Attending: Hematology and Oncology | Admitting: Hematology and Oncology

## 2020-08-06 ENCOUNTER — Ambulatory Visit: Payer: Medicare Other | Admitting: Gynecologic Oncology

## 2020-08-06 VITALS — BP 133/64 | HR 97 | Temp 97.4°F | Resp 18 | Ht 70.0 in | Wt 227.2 lb

## 2020-08-06 DIAGNOSIS — R600 Localized edema: Secondary | ICD-10-CM

## 2020-08-06 DIAGNOSIS — C52 Malignant neoplasm of vagina: Secondary | ICD-10-CM | POA: Diagnosis not present

## 2020-08-06 DIAGNOSIS — Z5111 Encounter for antineoplastic chemotherapy: Secondary | ICD-10-CM | POA: Insufficient documentation

## 2020-08-06 DIAGNOSIS — Z7189 Other specified counseling: Secondary | ICD-10-CM

## 2020-08-06 MED ORDER — PROCHLORPERAZINE MALEATE 10 MG PO TABS: 10.0000 mg | ORAL_TABLET | Freq: Four times a day (QID) | ORAL | 1 refills | Status: DC | PRN

## 2020-08-06 MED ORDER — ONDANSETRON HCL 8 MG PO TABS: 8.0000 mg | ORAL_TABLET | Freq: Three times a day (TID) | ORAL | 1 refills | Status: DC | PRN

## 2020-08-06 NOTE — Progress Notes (Signed)
Mia Simpson OFFICE PROGRESS NOTE  Patient Care Team: [name], [name], MD as PCP - General (Family Medicine) Everitt Amber, MD as Consulting Physician (Gynecologic Oncology)  ASSESSMENT & PLAN:  Vaginal cancer [facility]) I have reviewed multiple PET CT scan with the patient and her daughter-in-law She is symptomatic with cancer recurrence We discussed the risk and benefits of treatment Her case was presented at the recent tumor board meeting The repeat biopsy showed poorly differentiated carcinoma PD-L1 testing was 0% I explained to the patient and her daughter-in-law the rationale of why surgery, her repeat radiation therapy or immunotherapy were not indicated If the patient desires treatment, the only treatment I can offer her is chemotherapy  We discussed the role of chemotherapy. The intent is of palliative intent.  We discussed some of the risks, benefits, side-effects of carboplatin  Some of the short term side-effects included, though not limited to, including weight loss, life threatening infections, risk of allergic reactions, need for transfusions of blood products, nausea, vomiting, change in bowel habits, loss of hair, admission to hospital for various reasons, and risks of death.   Long term side-effects are also discussed including risks of infertility, permanent damage to nerve function, hearing loss, chronic fatigue, kidney damage with possibility needing hemodialysis, and rare secondary malignancy including bone marrow disorders.  The patient is aware that the response rates discussed earlier is not guaranteed.  After a long discussion, patient made an informed decision to proceed with the prescribed plan of care.   Patient education material was dispensed. She has reasonable venous access and would not need port placement I plan for carboplatin AUC of 4 and will sign her treatment order next week once we have based on blood work repeated I recommend chemo  education class and blood work next week with the plan to start her first cycle of treatment on 3/21 I recommend minimum 3 doses before repeating imaging study    Bilateral lower extremity edema She will continue diuretic therapy as needed  Goals of care, counseling/discussion She is aware that treatment goal is palliative in nature She has advanced directive and living will She wants to be resuscitated if needed Her son, Lesli Albee is her healthcare power of attorney   Orders Placed This Encounter  Procedures  . CBC with Differential (Cancer Center Only)    Standing Status:   Standing    Number of Occurrences:   20    Standing Expiration Date:   08/06/2021  . Comprehensive metabolic panel    Standing Status:   Standing    Number of Occurrences:   33    Standing Expiration Date:   08/06/2021    All questions were answered. The patient knows to call the clinic with any problems, questions or concerns. The total time spent in the appointment was 40 minutes encounter with patients including review of chart and various tests results, discussions about plan of care and coordination of care plan   Heath Lark, MD 08/06/2020 2:37 PM  INTERVAL HISTORY: Please see below for problem oriented charting. She returns with family to discuss treatment options She has occasional bleeding in her vulvar area She is not in pain  SUMMARY OF ONCOLOGIC HISTORY: Oncology History Overview Note  BRAF neg, HMB-45 is negative PD-L1 0%   Vaginal cancer ([place])  01/28/2019 Pathology Results   Vagina, biopsy - POORLY DIFFERENTIATED MALIGNANT NEOPLASM. - SEE MICROSCOPIC DESCRIPTION Microscopic Comment The neoplasm is characterized by diffuse sheets of markedly atypical cells with  nuclear enlargement and nucleoli. A battery of immunohistochemical stains shows positivity with Melan-A, SOX-10, vimentin and CD117. The tumor is negative with cytokeratin AE1/AE3, cytokeratin 903, cytokeratin 7, cytokeratin 8/18,  CD34, CD56, CD45, CD138, cytokeratin 5/6, chromogranin, desmin, epithelial membrane antigen, estrogen receptor, Factor XIII-A, GATA-3, GCDFP, inhibin, muscle specific actin, p63, PAX-8, progesterone receptor, synaptophysin, TTF-1, WT-1, smooth muscle actin, CD10, CD31, cytokeratin 20 and S100. The morphologic and immunophenotypic differential diagnosis includes malignant melanoma and clear cell sarcoma. Molecular genetic studies can be performed if requested   02/06/2019 Imaging   MRI pelvis 5.8 cm vaginal soft tissue mass with central necrosis, which shows a focal area of invasion through the inferior left levator ani muscle.   No evidence of pelvic lymphadenopathy.   02/14/2019 Imaging   MRI brain 1. 2.3 cm extra-axial mass along the right aspect of the clivus/posterior clinoid process consistent with a meningioma. 2. No evidence of intracranial metastases. 3. Moderate chronic small vessel ischemic disease.   02/17/2019 PET scan   1. Soft tissue mass within the vagina with central necrosis is again noted and is intensely FDG avid. 2. No specific findings to suggest nodal metastasis or distant metastatic disease. 3. There is a low-density subcutaneous soft tissue nodule within the right side of neck which is FDG avid. The CT appearance favors a sebaceous cyst. If inflamed/infected this may account for the increased uptake. Metastatic disease is less favored but not 100% excluded. Clinical correlation and biopsy if indicated. 4. Gallstones 5.  Aortic Atherosclerosis (ICD10-I70.0).     02/19/2019 Cancer Staging   Staging form: Vagina, AJCC 8th Edition - Clinical: Stage III (cT3, cN0, cM0) - Signed by Heath Lark, MD on 02/19/2019   06/13/2019 PET scan   1. Considerable improvement in size and activity of the vaginal mass, current maximum SUV 6.2, previously 14.8. No findings of adenopathy or metastatic disease at this time. 2. Resolution of the prior hypermetabolic subcutaneous lesion along the  right posterior neck, which was probably inflammatory. 3. Other imaging findings of potential clinical significance: Aortic Atherosclerosis (ICD10-I70.0). Mild cardiomegaly. Cholelithiasis.   07/20/2020 Pathology Results   A. VAGINA, LEFT WALL, BIOPSY:  - Poorly differentiated malignant neoplasm, see comment.   COMMENT:   The morphology is similar to the patient's prior biopsy ([id number]). Additional studies can be performed upon request.    08/16/2020 -  Chemotherapy    Patient is on Treatment Plan: VULVAR CANCER CARBOPLATIN Q21D        REVIEW OF SYSTEMS:   Constitutional: Denies fevers, chills or abnormal weight loss Eyes: Denies blurriness of vision Ears, nose, mouth, throat, and face: Denies mucositis or sore throat Respiratory: Denies cough, dyspnea or wheezes Cardiovascular: Denies palpitation, chest discomfort or lower extremity swelling Gastrointestinal:  Denies nausea, heartburn or change in bowel habits Skin: Denies abnormal skin rashes Lymphatics: Denies new lymphadenopathy or easy bruising Neurological:Denies numbness, tingling or new weaknesses Behavioral/Psych: Mood is stable, no new changes  All other systems were reviewed with the patient and are negative.  I have reviewed the past medical history, past surgical history, social history and family history with the patient and they are unchanged from previous note.  ALLERGIES:  has No Known Allergies.  MEDICATIONS:  Current Outpatient Medications  Medication Sig Dispense Refill  . Calcium Carbonate (CALCIUM 600 PO) Take 2 tablets by mouth daily.     . diclofenac Sodium (VOLTAREN) 1 % GEL APPLY 4 G TOPICALLY 4 (FOUR) TIMES DAILY. 200 g 4  . fluticasone (FLONASE) 50 MCG/ACT  nasal spray Place 1 spray into both nostrils at bedtime as needed for allergies or rhinitis. 16 mL 3  . hydrochlorothiazide (HYDRODIURIL) 25 MG tablet Take 1 tablet (25 mg total) by mouth daily. 90 tablet 1  . Multiple Vitamins-Minerals  (MULTIPLE VITAMINS/WOMENS PO) Take 1 tablet by mouth daily.     . Omega-3 Fatty Acids (FISH OIL) 1200 MG CAPS Take 2,400 mg by mouth daily.    . ondansetron (ZOFRAN) 8 MG tablet Take 1 tablet (8 mg total) by mouth every 8 (eight) hours as needed. 30 tablet 1  . prochlorperazine (COMPAZINE) 10 MG tablet Take 1 tablet (10 mg total) by mouth every 6 (six) hours as needed (Nausea or vomiting). 30 tablet 1  . Trospium Chloride 60 MG CP24 TAKE 1 CAPSULE BY MOUTH EVERY DAY 30 capsule 11   No current facility-administered medications for this visit.    PHYSICAL EXAMINATION: ECOG PERFORMANCE STATUS: 1 - Symptomatic but completely ambulatory  Vitals:   08/06/20 1333  BP: 133/64  Pulse: 97  Resp: 18  Temp: (!) 97.4 F (36.3 C)  SpO2: 98%   Filed Weights   08/06/20 1333  Weight: 227 lb 3.2 oz (103.1 kg)    GENERAL:alert, no distress and comfortable HEART with moderate lower extremity edema NEURO: alert & oriented x 3 with fluent speech, no focal motor/sensory deficits  LABORATORY DATA:  I have reviewed the data as listed    Component Value Date/Time   NA 144 09/11/2019 0950   K 4.7 09/11/2019 0950   CL 110 09/11/2019 0950   CO2 26 09/11/2019 0950   GLUCOSE 90 09/11/2019 0950   BUN 19 09/11/2019 0950   CREATININE 0.90 09/11/2019 0950   CREATININE 0.88 02/05/2019 1052   CALCIUM 9.3 09/11/2019 0950   PROT 7.3 09/11/2019 0950   ALBUMIN 4.0 09/11/2019 0950   AST 18 09/11/2019 0950   ALT 9 09/11/2019 0950   ALKPHOS 57 09/11/2019 0950   BILITOT 0.5 09/11/2019 0950   GFRNONAA 57 (L) 09/11/2019 0950   GFRAA >60 09/11/2019 0950    No results found for: SPEP, UPEP  Lab Results  Component Value Date   WBC 3.3 (L) 09/11/2019   NEUTROABS 2.2 09/11/2019   HGB 12.6 09/11/2019   HCT 39.4 09/11/2019   MCV 95.2 09/11/2019   PLT 148 (L) 09/11/2019      Chemistry      Component Value Date/Time   NA 144 09/11/2019 0950   K 4.7 09/11/2019 0950   CL 110 09/11/2019 0950   CO2 26  09/11/2019 0950   BUN 19 09/11/2019 0950   CREATININE 0.90 09/11/2019 0950   CREATININE 0.88 02/05/2019 1052      Component Value Date/Time   CALCIUM 9.3 09/11/2019 0950   ALKPHOS 57 09/11/2019 0950   AST 18 09/11/2019 0950   ALT 9 09/11/2019 0950   BILITOT 0.5 09/11/2019 0950       RADIOGRAPHIC STUDIES: I have reviewed multiple imaging studies with the patient and her daughter-in-law I have personally reviewed the radiological images as listed and agreed with the findings in the report. NM PET Image Restag (PS) Skull Base To Thigh  Result Date: 08/05/2020 CLINICAL DATA:  Subsequent treatment strategy for vaginal cancer. Clinical concern for left vaginal wall recurrence. EXAM: NUCLEAR MEDICINE PET SKULL BASE TO THIGH TECHNIQUE: 11.3 mCi F-18 FDG was injected intravenously. Full-ring PET imaging was performed from the skull base to thigh after the radiotracer. CT data was obtained and used for attenuation correction  and anatomic localization. Fasting blood glucose: 84 mg/dl COMPARISON:  09/11/2019 FINDINGS: Mediastinal blood pool activity: SUV max 2.7 Liver activity: SUV max NA NECK: No hypermetabolic lymph nodes in the neck. Incidental CT findings: none CHEST: No hypermetabolic mediastinal or hilar nodes. No suspicious pulmonary nodules on the CT scan. Focal uptake identified distal esophagus just proximal to tiny hiatal hernia, potentially related to esophagitis. Atherosclerotic calcification is noted in the wall of the thoracic aorta. Incidental CT findings: none ABDOMEN/PELVIS: No abnormal hypermetabolic activity within the liver, pancreas, adrenal glands, or spleen. No hypermetabolic lymph nodes in the abdomen or pelvis. Interval progression of hypermetabolism along the left aspect of the vagina with SUV max = 15.8 today compared to 5.1 previously Incidental CT findings: Calcified gallstones evident. Stable appearance of inferior left renal cyst. There is abdominal aortic atherosclerosis  without aneurysm. SKELETON: No focal hypermetabolic activity to suggest skeletal metastasis. Incidental CT findings: No worrisome lytic or sclerotic osseous abnormality. Degenerative changes again noted lumbar spine. IMPRESSION: 1. Interval progression of hypermetabolic activity in the region of the left vagina, suspicious disease progression. 2. No evidence for hypermetabolic metastatic disease in the neck, chest, abdomen, or pelvis. 3. Cholelithiasis. 4.  Aortic Atherosclerois (ICD10-170.0) Electronically Signed   By: Misty Stanley M.D.   On: 08/05/2020 08:33

## 2020-08-06 NOTE — Assessment & Plan Note (Signed)
She will continue diuretic therapy as needed

## 2020-08-06 NOTE — Progress Notes (Signed)
DISCONTINUE ON PATHWAY REGIMEN - Other  No Medical Intervention - Off Treatment.  REASON: Disease Progression PRIOR TREATMENT: Off Treatment  START OFF PATHWAY REGIMEN - Other   OFF02260:Carboplatin AUC=2:   Administer weekly:     Carboplatin   **Always confirm dose/schedule in your pharmacy ordering system**  Patient Characteristics: Intent of Therapy: Non-Curative / Palliative Intent, Discussed with Patient

## 2020-08-06 NOTE — Assessment & Plan Note (Signed)
She is aware that treatment goal is palliative in nature She has advanced directive and living will She wants to be resuscitated if needed Her son, Lesli Albee is her healthcare power of attorney

## 2020-08-06 NOTE — Progress Notes (Signed)
Other - No Medical Intervention - Off Treatment.    Patient Characteristics:

## 2020-08-06 NOTE — Assessment & Plan Note (Addendum)
I have reviewed multiple PET CT scan with the patient and her daughter-in-law She is symptomatic with cancer recurrence We discussed the risk and benefits of treatment Her case was presented at the recent tumor board meeting The repeat biopsy showed poorly differentiated carcinoma PD-L1 testing was 0% I explained to the patient and her daughter-in-law the rationale of why surgery, her repeat radiation therapy or immunotherapy were not indicated If the patient desires treatment, the only treatment I can offer her is chemotherapy  We discussed the role of chemotherapy. The intent is of palliative intent.  We discussed some of the risks, benefits, side-effects of carboplatin  Some of the short term side-effects included, though not limited to, including weight loss, life threatening infections, risk of allergic reactions, need for transfusions of blood products, nausea, vomiting, change in bowel habits, loss of hair, admission to hospital for various reasons, and risks of death.   Long term side-effects are also discussed including risks of infertility, permanent damage to nerve function, hearing loss, chronic fatigue, kidney damage with possibility needing hemodialysis, and rare secondary malignancy including bone marrow disorders.  The patient is aware that the response rates discussed earlier is not guaranteed.  After a long discussion, patient made an informed decision to proceed with the prescribed plan of care.   Patient education material was dispensed. She has reasonable venous access and would not need port placement I plan for carboplatin AUC of 4 and will sign her treatment order next week once we have based on blood work repeated I recommend chemo education class and blood work next week with the plan to start her first cycle of treatment on 3/21 I recommend minimum 3 doses before repeating imaging study

## 2020-08-09 NOTE — Progress Notes (Signed)
Pharmacist Chemotherapy Monitoring - Initial Assessment    Anticipated start date: 08/16/20   Regimen:   Are orders appropriate based on the patients diagnosis, regimen, and cycle? Yes  Does the plan date match the patients scheduled date? Yes  Is the sequencing of drugs appropriate? Yes  Are the premedications appropriate for the patients regimen? Yes  Prior Authorization for treatment is: Approved o If applicable, is the correct biosimilar selected based on the patient's insurance? not applicable  Organ Function and Labs:  Are dose adjustments needed based on the patient's renal function, hepatic function, or hematologic function? No  Are appropriate labs ordered prior to the start of patient's treatment? Yes  Other organ system assessment, if indicated: N/A  The following baseline labs, if indicated, have been ordered: N/A  Dose Assessment:  Are the drug doses appropriate? Yes  Are the following correct: o Drug concentrations Yes o IV fluid compatible with drug Yes o Administration routes Yes o Timing of therapy Yes  If applicable, does the patient have documented access for treatment and/or plans for port-a-cath placement? no  If applicable, have lifetime cumulative doses been properly documented and assessed? yes Lifetime Dose Tracking  No doses have been documented on this patient for the following tracked chemicals: Doxorubicin, Epirubicin, Idarubicin, Daunorubicin, Mitoxantrone, Bleomycin, Oxaliplatin, Carboplatin, Liposomal Doxorubicin  o   Toxicity Monitoring/Prevention:  The patient has the following take home antiemetics prescribed: Ondansetron and Prochlorperazine  The patient has the following take home medications prescribed: N/A  Medication allergies and previous infusion related reactions, if applicable, have been reviewed and addressed. Yes  The patient's current medication list has been assessed for drug-drug interactions with their  chemotherapy regimen. no significant drug-drug interactions were identified on review.  Order Review:  Are the treatment plan orders signed? No  Is the patient scheduled to see a provider prior to their treatment? No  I verify that I have reviewed each item in the above checklist and answered each question accordingly.   Kennith Center, Pharm.D., CPP 08/09/2020@2 :54 PM

## 2020-08-13 ENCOUNTER — Inpatient Hospital Stay: Payer: Medicare Other

## 2020-08-13 ENCOUNTER — Other Ambulatory Visit: Payer: Self-pay | Admitting: Hematology and Oncology

## 2020-08-13 DIAGNOSIS — Z5111 Encounter for antineoplastic chemotherapy: Secondary | ICD-10-CM | POA: Diagnosis not present

## 2020-08-13 DIAGNOSIS — C52 Malignant neoplasm of vagina: Secondary | ICD-10-CM | POA: Diagnosis not present

## 2020-08-13 LAB — CBC WITH DIFFERENTIAL (CANCER CENTER ONLY)
Abs Immature Granulocytes: 0.01 10*3/uL (ref 0.00–0.07)
Basophils Absolute: 0 10*3/uL (ref 0.0–0.1)
Basophils Relative: 1 %
Eosinophils Absolute: 0.1 10*3/uL (ref 0.0–0.5)
Eosinophils Relative: 2 %
HCT: 35.9 % — ABNORMAL LOW (ref 36.0–46.0)
Hemoglobin: 11.9 g/dL — ABNORMAL LOW (ref 12.0–15.0)
Immature Granulocytes: 0 %
Lymphocytes Relative: 16 %
Lymphs Abs: 0.5 10*3/uL — ABNORMAL LOW (ref 0.7–4.0)
MCH: 30.9 pg (ref 26.0–34.0)
MCHC: 33.1 g/dL (ref 30.0–36.0)
MCV: 93.2 fL (ref 80.0–100.0)
Monocytes Absolute: 0.3 10*3/uL (ref 0.1–1.0)
Monocytes Relative: 11 %
Neutro Abs: 2.2 10*3/uL (ref 1.7–7.7)
Neutrophils Relative %: 70 %
Platelet Count: 168 10*3/uL (ref 150–400)
RBC: 3.85 MIL/uL — ABNORMAL LOW (ref 3.87–5.11)
RDW: 14.9 % (ref 11.5–15.5)
WBC Count: 3.1 10*3/uL — ABNORMAL LOW (ref 4.0–10.5)
nRBC: 0 % (ref 0.0–0.2)

## 2020-08-13 LAB — COMPREHENSIVE METABOLIC PANEL
ALT: 14 U/L (ref 0–44)
AST: 20 U/L (ref 15–41)
Albumin: 4 g/dL (ref 3.5–5.0)
Alkaline Phosphatase: 55 U/L (ref 38–126)
Anion gap: 6 (ref 5–15)
BUN: 19 mg/dL (ref 8–23)
CO2: 26 mmol/L (ref 22–32)
Calcium: 8.8 mg/dL — ABNORMAL LOW (ref 8.9–10.3)
Chloride: 109 mmol/L (ref 98–111)
Creatinine, Ser: 0.8 mg/dL (ref 0.44–1.00)
GFR, Estimated: >60 mL/min (ref >60)
Glucose, Bld: 92 mg/dL (ref 70–99)
Potassium: 3.9 mmol/L (ref 3.5–5.1)
Sodium: 141 mmol/L (ref 135–145)
Total Bilirubin: 0.6 mg/dL (ref 0.3–1.2)
Total Protein: 7 g/dL (ref 6.5–8.1)

## 2020-08-16 ENCOUNTER — Other Ambulatory Visit: Payer: Self-pay

## 2020-08-16 ENCOUNTER — Inpatient Hospital Stay: Payer: Medicare Other

## 2020-08-16 ENCOUNTER — Other Ambulatory Visit: Payer: Self-pay | Admitting: Hematology and Oncology

## 2020-08-16 VITALS — BP 120/87 | HR 86 | Temp 98.1°F | Resp 18

## 2020-08-16 DIAGNOSIS — Z5111 Encounter for antineoplastic chemotherapy: Secondary | ICD-10-CM | POA: Diagnosis not present

## 2020-08-16 DIAGNOSIS — C52 Malignant neoplasm of vagina: Secondary | ICD-10-CM | POA: Diagnosis not present

## 2020-08-16 MED ORDER — PALONOSETRON HCL INJECTION 0.25 MG/5ML
INTRAVENOUS | Status: AC
  Filled 2020-08-16: qty 5

## 2020-08-16 MED ORDER — DEXAMETHASONE SODIUM PHOSPHATE 100 MG/10ML IJ SOLN
10.0000 mg | Freq: Once | INTRAMUSCULAR | Status: AC
  Administered 2020-08-16: 10 mg via INTRAVENOUS
  Filled 2020-08-16: qty 10

## 2020-08-16 MED ORDER — PALONOSETRON HCL INJECTION 0.25 MG/5ML
0.2500 mg | Freq: Once | INTRAVENOUS | Status: AC
  Administered 2020-08-16: 0.25 mg via INTRAVENOUS

## 2020-08-16 MED ORDER — SODIUM CHLORIDE 0.9 % IV SOLN
340.0000 mg | Freq: Once | INTRAVENOUS | Status: AC
  Administered 2020-08-16: 340 mg via INTRAVENOUS
  Filled 2020-08-16: qty 34

## 2020-08-16 MED ORDER — SODIUM CHLORIDE 0.9 % IV SOLN
150.0000 mg | Freq: Once | INTRAVENOUS | Status: AC
  Administered 2020-08-16: 150 mg via INTRAVENOUS
  Filled 2020-08-16: qty 150

## 2020-08-16 MED ORDER — SODIUM CHLORIDE 0.9 % IV SOLN
Freq: Once | INTRAVENOUS | Status: AC
  Filled 2020-08-16: qty 250

## 2020-08-16 NOTE — Progress Notes (Signed)
Change Carboplatin dose to AUC 4 (340mg ) today per MD.  Acquanetta Belling, RPH, BCPS, BCOP 08/16/2020 10:26 AM

## 2020-08-16 NOTE — Patient Instructions (Signed)
[facility] Discharge Instructions for Patients Receiving Chemotherapy  Today you received the following chemotherapy agents: carboplatin.  To help prevent nausea and vomiting after your treatment, we encourage you to take your nausea medication as directed.   If you develop nausea and vomiting that is not controlled by your nausea medication, call the clinic.   BELOW ARE SYMPTOMS THAT SHOULD BE REPORTED IMMEDIATELY:  *FEVER GREATER THAN 100.5 F  *CHILLS WITH OR WITHOUT FEVER  NAUSEA AND VOMITING THAT IS NOT CONTROLLED WITH YOUR NAUSEA MEDICATION  *UNUSUAL SHORTNESS OF BREATH  *UNUSUAL BRUISING OR BLEEDING  TENDERNESS IN MOUTH AND THROAT WITH OR WITHOUT PRESENCE OF ULCERS  *URINARY PROBLEMS  *BOWEL PROBLEMS  UNUSUAL RASH Items with * indicate a potential emergency and should be followed up as soon as possible.  Feel free to call the clinic should you have any questions or concerns. The clinic phone number is [phone].  Please show the [place] at check-in to the Emergency Department and triage nurse.  Carboplatin injection What is this medicine? CARBOPLATIN (KAR boe pla tin) is a chemotherapy drug. It targets fast dividing cells, like cancer cells, and causes these cells to die. This medicine is used to treat ovarian cancer and many other cancers. This medicine may be used for other purposes; ask your health care provider or pharmacist if you have questions. COMMON BRAND NAME(S): Paraplatin What should I tell my health care provider before I take this medicine? They need to know if you have any of these conditions:  blood disorders  hearing problems  kidney disease  recent or ongoing radiation therapy  an unusual or allergic reaction to carboplatin, cisplatin, other chemotherapy, other medicines, foods, dyes, or preservatives  pregnant or trying to get pregnant  breast-feeding How should I use this medicine? This drug is usually  given as an infusion into a vein. It is administered in a hospital or clinic by a specially trained health care professional. Talk to your pediatrician regarding the use of this medicine in children. Special care may be needed. Overdosage: If you think you have taken too much of this medicine contact a poison control center or emergency room at once. NOTE: This medicine is only for you. Do not share this medicine with others. What if I miss a dose? It is important not to miss a dose. Call your doctor or health care professional if you are unable to keep an appointment. What may interact with this medicine?  medicines for seizures  medicines to increase blood counts like filgrastim, pegfilgrastim, sargramostim  some antibiotics like amikacin, gentamicin, neomycin, streptomycin, tobramycin  vaccines Talk to your doctor or health care professional before taking any of these medicines:  acetaminophen  aspirin  ibuprofen  ketoprofen  naproxen This list may not describe all possible interactions. Give your health care provider a list of all the medicines, herbs, non-prescription drugs, or dietary supplements you use. Also tell them if you smoke, drink alcohol, or use illegal drugs. Some items may interact with your medicine. What should I watch for while using this medicine? Your condition will be monitored carefully while you are receiving this medicine. You will need important blood work done while you are taking this medicine. This drug may make you feel generally unwell. This is not uncommon, as chemotherapy can affect healthy cells as well as cancer cells. Report any side effects. Continue your course of treatment even though you feel ill unless your doctor tells you to stop.  In some cases, you may be given additional medicines to help with side effects. Follow all directions for their use. Call your doctor or health care professional for advice if you get a fever, chills or sore  throat, or other symptoms of a cold or flu. Do not treat yourself. This drug decreases your body's ability to fight infections. Try to avoid being around people who are sick. This medicine may increase your risk to bruise or bleed. Call your doctor or health care professional if you notice any unusual bleeding. Be careful brushing and flossing your teeth or using a toothpick because you may get an infection or bleed more easily. If you have any dental work done, tell your dentist you are receiving this medicine. Avoid taking products that contain aspirin, acetaminophen, ibuprofen, naproxen, or ketoprofen unless instructed by your doctor. These medicines may hide a fever. Do not become pregnant while taking this medicine. Women should inform their doctor if they wish to become pregnant or think they might be pregnant. There is a potential for serious side effects to an unborn child. Talk to your health care professional or pharmacist for more information. Do not breast-feed an infant while taking this medicine. What side effects may I notice from receiving this medicine? Side effects that you should report to your doctor or health care professional as soon as possible:  allergic reactions like skin rash, itching or hives, swelling of the face, lips, or tongue  signs of infection - fever or chills, cough, sore throat, pain or difficulty passing urine  signs of decreased platelets or bleeding - bruising, pinpoint red spots on the skin, black, tarry stools, nosebleeds  signs of decreased red blood cells - unusually weak or tired, fainting spells, lightheadedness  breathing problems  changes in hearing  changes in vision  chest pain  high blood pressure  low blood counts - This drug may decrease the number of white blood cells, red blood cells and platelets. You may be at increased risk for infections and bleeding.  nausea and vomiting  pain, swelling, redness or irritation at the injection  site  pain, tingling, numbness in the hands or feet  problems with balance, talking, walking  trouble passing urine or change in the amount of urine Side effects that usually do not require medical attention (report to your doctor or health care professional if they continue or are bothersome):  hair loss  loss of appetite  metallic taste in the mouth or changes in taste This list may not describe all possible side effects. Call your doctor for medical advice about side effects. You may report side effects to FDA at 1-800-FDA-1088. Where should I keep my medicine? This drug is given in a hospital or clinic and will not be stored at home. NOTE: This sheet is a summary. It may not cover all possible information. If you have questions about this medicine, talk to your doctor, pharmacist, or health care provider.  2021 Elsevier/Gold Standard (2007-08-20 14:38:05)

## 2020-08-19 ENCOUNTER — Telehealth: Payer: Self-pay | Admitting: *Deleted

## 2020-09-06 ENCOUNTER — Inpatient Hospital Stay (HOSPITAL_BASED_OUTPATIENT_CLINIC_OR_DEPARTMENT_OTHER): Payer: Medicare Other | Admitting: Hematology and Oncology

## 2020-09-06 ENCOUNTER — Other Ambulatory Visit: Payer: Self-pay

## 2020-09-06 ENCOUNTER — Inpatient Hospital Stay: Payer: Medicare Other | Attending: Hematology and Oncology

## 2020-09-06 ENCOUNTER — Inpatient Hospital Stay: Payer: Medicare Other

## 2020-09-06 ENCOUNTER — Other Ambulatory Visit: Payer: Self-pay | Admitting: Hematology and Oncology

## 2020-09-06 ENCOUNTER — Encounter: Payer: Self-pay | Admitting: Hematology and Oncology

## 2020-09-06 DIAGNOSIS — R7989 Other specified abnormal findings of blood chemistry: Secondary | ICD-10-CM | POA: Insufficient documentation

## 2020-09-06 DIAGNOSIS — D61818 Other pancytopenia: Secondary | ICD-10-CM

## 2020-09-06 DIAGNOSIS — C52 Malignant neoplasm of vagina: Secondary | ICD-10-CM | POA: Diagnosis not present

## 2020-09-06 DIAGNOSIS — Z5111 Encounter for antineoplastic chemotherapy: Secondary | ICD-10-CM | POA: Insufficient documentation

## 2020-09-06 DIAGNOSIS — K5909 Other constipation: Secondary | ICD-10-CM | POA: Insufficient documentation

## 2020-09-06 DIAGNOSIS — R634 Abnormal weight loss: Secondary | ICD-10-CM | POA: Diagnosis not present

## 2020-09-06 LAB — CBC WITH DIFFERENTIAL (CANCER CENTER ONLY)
Abs Immature Granulocytes: 0.01 10*3/uL (ref 0.00–0.07)
Basophils Absolute: 0 10*3/uL (ref 0.0–0.1)
Basophils Relative: 1 %
Eosinophils Absolute: 0 10*3/uL (ref 0.0–0.5)
Eosinophils Relative: 1 %
HCT: 37.4 % (ref 36.0–46.0)
Hemoglobin: 12.5 g/dL (ref 12.0–15.0)
Immature Granulocytes: 0 %
Lymphocytes Relative: 18 %
Lymphs Abs: 0.6 10*3/uL — ABNORMAL LOW (ref 0.7–4.0)
MCH: 31.7 pg (ref 26.0–34.0)
MCHC: 33.4 g/dL (ref 30.0–36.0)
MCV: 94.9 fL (ref 80.0–100.0)
Monocytes Absolute: 0.4 10*3/uL (ref 0.1–1.0)
Monocytes Relative: 13 %
Neutro Abs: 2.2 10*3/uL (ref 1.7–7.7)
Neutrophils Relative %: 67 %
Platelet Count: 129 10*3/uL — ABNORMAL LOW (ref 150–400)
RBC: 3.94 MIL/uL (ref 3.87–5.11)
RDW: 14.9 % (ref 11.5–15.5)
WBC Count: 3.3 10*3/uL — ABNORMAL LOW (ref 4.0–10.5)
nRBC: 0 % (ref 0.0–0.2)

## 2020-09-06 LAB — COMPREHENSIVE METABOLIC PANEL
ALT: 111 U/L — ABNORMAL HIGH (ref 0–44)
AST: 35 U/L (ref 15–41)
Albumin: 4.2 g/dL (ref 3.5–5.0)
Alkaline Phosphatase: 89 U/L (ref 38–126)
Anion gap: 12 (ref 5–15)
BUN: 14 mg/dL (ref 8–23)
CO2: 25 mmol/L (ref 22–32)
Calcium: 9.3 mg/dL (ref 8.9–10.3)
Chloride: 106 mmol/L (ref 98–111)
Creatinine, Ser: 0.93 mg/dL (ref 0.44–1.00)
GFR, Estimated: 58 mL/min — ABNORMAL LOW (ref >60)
Glucose, Bld: 102 mg/dL — ABNORMAL HIGH (ref 70–99)
Potassium: 4.2 mmol/L (ref 3.5–5.1)
Sodium: 143 mmol/L (ref 135–145)
Total Bilirubin: 0.6 mg/dL (ref 0.3–1.2)
Total Protein: 7.5 g/dL (ref 6.5–8.1)

## 2020-09-06 MED ORDER — SODIUM CHLORIDE 0.9 % IV SOLN
Freq: Once | INTRAVENOUS | Status: AC
  Filled 2020-09-06: qty 250

## 2020-09-06 MED ORDER — SODIUM CHLORIDE 0.9 % IV SOLN
10.0000 mg | Freq: Once | INTRAVENOUS | Status: AC
  Administered 2020-09-06: 10 mg via INTRAVENOUS
  Filled 2020-09-06: qty 10

## 2020-09-06 MED ORDER — PALONOSETRON HCL INJECTION 0.25 MG/5ML
INTRAVENOUS | Status: AC
  Filled 2020-09-06: qty 5

## 2020-09-06 MED ORDER — CARBOPLATIN CHEMO INJECTION 450 MG/45ML
293.6500 mg | Freq: Once | INTRAVENOUS | Status: AC
  Administered 2020-09-06: 290 mg via INTRAVENOUS
  Filled 2020-09-06: qty 29

## 2020-09-06 MED ORDER — PALONOSETRON HCL INJECTION 0.25 MG/5ML
0.2500 mg | Freq: Once | INTRAVENOUS | Status: AC
Start: 2020-09-06 — End: 2020-09-06
  Administered 2020-09-06: 0.25 mg via INTRAVENOUS

## 2020-09-06 MED ORDER — SODIUM CHLORIDE 0.9 % IV SOLN
150.0000 mg | Freq: Once | INTRAVENOUS | Status: AC
  Administered 2020-09-06: 150 mg via INTRAVENOUS
  Filled 2020-09-06: qty 150

## 2020-09-06 NOTE — Progress Notes (Signed)
[facility] OFFICE PROGRESS NOTE  Patient Care Team: [name], [name], MD as PCP - General (Family Medicine) Everitt Amber, MD as Consulting Physician (Gynecologic Oncology)  ASSESSMENT & PLAN:  Vaginal cancer [facility]) So far, she tolerated treatment very well except for constipation, elevated liver enzymes of unknown reason and 7 pound weight loss which she attributed to changes in her diet She is getting slightly more pancytopenic I plan to reduce treatment based on her adjusted weight and reduce carboplatin to AUC of 3.5 I recommend minimum 3-4 cycles of treatment before repeat imaging study   Pancytopenia, acquired ([place]) This is multifactorial, likely due to treatment She is not symptomatic We will proceed with treatment with dose adjustment as above  Weight loss, abnormal Plan to reduce the dose of chemotherapy and adjust based on her most current weight  Other constipation We discussed aggressive laxative therapy She is recommended to take MiraLAX daily for at least 3 days She is instructed to call if MiraLAX is not helping  Elevated LFTs The cause is unknown Observe closely for now We will proceed with treatment without delay   No orders of the defined types were placed in this encounter.   All questions were answered. The patient knows to call the clinic with any problems, questions or concerns. The total time spent in the appointment was 30 minutes encounter with patients including review of chart and various tests results, discussions about plan of care and coordination of care plan   Heath Lark, MD 09/06/2020 11:55 AM  INTERVAL HISTORY: Please see below for problem oriented charting. She returns for further follow-up She tolerated last cycle of treatment well except for constipation for several days She is noted to have lost some weight but she attributed to changes in her diet No recent nausea Her energy level is fair She has minimum trace vaginal  bleeding 1 time  SUMMARY OF ONCOLOGIC HISTORY: Oncology History Overview Note  BRAF neg, HMB-45 is negative PD-L1 0%   Vaginal cancer ([place])  01/28/2019 Pathology Results   Vagina, biopsy - POORLY DIFFERENTIATED MALIGNANT NEOPLASM. - SEE MICROSCOPIC DESCRIPTION Microscopic Comment The neoplasm is characterized by diffuse sheets of markedly atypical cells with nuclear enlargement and nucleoli. A battery of immunohistochemical stains shows positivity with Melan-A, SOX-10, vimentin and CD117. The tumor is negative with cytokeratin AE1/AE3, cytokeratin 903, cytokeratin 7, cytokeratin 8/18, CD34, CD56, CD45, CD138, cytokeratin 5/6, chromogranin, desmin, epithelial membrane antigen, estrogen receptor, Factor XIII-A, GATA-3, GCDFP, inhibin, muscle specific actin, p63, PAX-8, progesterone receptor, synaptophysin, TTF-1, WT-1, smooth muscle actin, CD10, CD31, cytokeratin 20 and S100. The morphologic and immunophenotypic differential diagnosis includes malignant melanoma and clear cell sarcoma. Molecular genetic studies can be performed if requested   02/06/2019 Imaging   MRI pelvis 5.8 cm vaginal soft tissue mass with central necrosis, which shows a focal area of invasion through the inferior left levator ani muscle.   No evidence of pelvic lymphadenopathy.   02/14/2019 Imaging   MRI brain 1. 2.3 cm extra-axial mass along the right aspect of the clivus/posterior clinoid process consistent with a meningioma. 2. No evidence of intracranial metastases. 3. Moderate chronic small vessel ischemic disease.   02/17/2019 PET scan   1. Soft tissue mass within the vagina with central necrosis is again noted and is intensely FDG avid. 2. No specific findings to suggest nodal metastasis or distant metastatic disease. 3. There is a low-density subcutaneous soft tissue nodule within the right side of neck which is FDG avid. The  CT appearance favors a sebaceous cyst. If inflamed/infected this may account for the  increased uptake. Metastatic disease is less favored but not 100% excluded. Clinical correlation and biopsy if indicated. 4. Gallstones 5.  Aortic Atherosclerosis (ICD10-I70.0).     02/19/2019 Cancer Staging   Staging form: Vagina, AJCC 8th Edition - Clinical: Stage III (cT3, cN0, cM0) - Signed by Heath Lark, MD on 02/19/2019   06/13/2019 PET scan   1. Considerable improvement in size and activity of the vaginal mass, current maximum SUV 6.2, previously 14.8. No findings of adenopathy or metastatic disease at this time. 2. Resolution of the prior hypermetabolic subcutaneous lesion along the right posterior neck, which was probably inflammatory. 3. Other imaging findings of potential clinical significance: Aortic Atherosclerosis (ICD10-I70.0). Mild cardiomegaly. Cholelithiasis.   07/20/2020 Pathology Results   A. VAGINA, LEFT WALL, BIOPSY:  - Poorly differentiated malignant neoplasm, see comment.   COMMENT:   The morphology is similar to the patient's prior biopsy ([id number]). Additional studies can be performed upon request.    08/16/2020 -  Chemotherapy    Patient is on Treatment Plan: VULVAR CANCER CARBOPLATIN Q21D        REVIEW OF SYSTEMS:   Constitutional: Denies fevers, chills  Eyes: Denies blurriness of vision Ears, nose, mouth, throat, and face: Denies mucositis or sore throat Respiratory: Denies cough, dyspnea or wheezes Cardiovascular: Denies palpitation, chest discomfort or lower extremity swelling Skin: Denies abnormal skin rashes Lymphatics: Denies new lymphadenopathy or easy bruising Neurological:Denies numbness, tingling or new weaknesses Behavioral/Psych: Mood is stable, no new changes  All other systems were reviewed with the patient and are negative.  I have reviewed the past medical history, past surgical history, social history and family history with the patient and they are unchanged from previous note.  ALLERGIES:  has No Known  Allergies.  MEDICATIONS:  Current Outpatient Medications  Medication Sig Dispense Refill  . polyethylene glycol (MIRALAX / GLYCOLAX) 17 g packet Take 17 g by mouth daily.    . Calcium Carbonate (CALCIUM 600 PO) Take 2 tablets by mouth daily.     . diclofenac Sodium (VOLTAREN) 1 % GEL APPLY 4 G TOPICALLY 4 (FOUR) TIMES DAILY. 200 g 4  . fluticasone (FLONASE) 50 MCG/ACT nasal spray Place 1 spray into both nostrils at bedtime as needed for allergies or rhinitis. 16 mL 3  . hydrochlorothiazide (HYDRODIURIL) 25 MG tablet Take 1 tablet (25 mg total) by mouth daily. 90 tablet 1  . Multiple Vitamins-Minerals (MULTIPLE VITAMINS/WOMENS PO) Take 1 tablet by mouth daily.     . Omega-3 Fatty Acids (FISH OIL) 1200 MG CAPS Take 2,400 mg by mouth daily.    . ondansetron (ZOFRAN) 8 MG tablet Take 1 tablet (8 mg total) by mouth every 8 (eight) hours as needed. 30 tablet 1  . prochlorperazine (COMPAZINE) 10 MG tablet Take 1 tablet (10 mg total) by mouth every 6 (six) hours as needed (Nausea or vomiting). 30 tablet 1  . Trospium Chloride 60 MG CP24 TAKE 1 CAPSULE BY MOUTH EVERY DAY 30 capsule 11   No current facility-administered medications for this visit.   Facility-Administered Medications Ordered in Other Visits  Medication Dose Route Frequency Provider Last Rate Last Admin  . CARBOplatin (PARAPLATIN) 290 mg in sodium chloride 0.9 % 250 mL chemo infusion  290 mg Intravenous Once Kenly Xiao, MD      . dexamethasone (DECADRON) 10 mg in sodium chloride 0.9 % 50 mL IVPB  10 mg Intravenous Once Heath Lark, MD 204  mL/hr at 09/06/20 1149 10 mg at 09/06/20 1149  . fosaprepitant (EMEND) 150 mg in sodium chloride 0.9 % 145 mL IVPB  150 mg Intravenous Once Alvy Bimler, Kaiser Belluomini, MD        PHYSICAL EXAMINATION: ECOG PERFORMANCE STATUS: 1 - Symptomatic but completely ambulatory  Vitals:   09/06/20 1048  BP: 112/82  Pulse: 91  Resp: 18  Temp: 98 F (36.7 C)  SpO2: 100%   Filed Weights   09/06/20 1048  Weight: 220  lb (99.8 kg)    GENERAL:alert, no distress and comfortable SKIN: skin color, texture, turgor are normal, no rashes or significant lesions EYES: normal, Conjunctiva are pink and non-injected, sclera clear OROPHARYNX:no exudate, no erythema and lips, buccal mucosa, and tongue normal  NECK: supple, thyroid normal size, non-tender, without nodularity LYMPH:  no palpable lymphadenopathy in the cervical, axillary or inguinal LUNGS: clear to auscultation and percussion with normal breathing effort HEART: regular rate & rhythm and no murmurs with trace bilateral lower extremity edema ABDOMEN:abdomen soft, non-tender and normal bowel sounds Musculoskeletal:no cyanosis of digits and no clubbing  NEURO: alert & oriented x 3 with fluent speech, no focal motor/sensory deficits  LABORATORY DATA:  I have reviewed the data as listed    Component Value Date/Time   NA 143 09/06/2020 1018   K 4.2 09/06/2020 1018   CL 106 09/06/2020 1018   CO2 25 09/06/2020 1018   GLUCOSE 102 (H) 09/06/2020 1018   BUN 14 09/06/2020 1018   CREATININE 0.93 09/06/2020 1018   CREATININE 0.88 02/05/2019 1052   CALCIUM 9.3 09/06/2020 1018   PROT 7.5 09/06/2020 1018   ALBUMIN 4.2 09/06/2020 1018   AST 35 09/06/2020 1018   ALT 111 (H) 09/06/2020 1018   ALKPHOS 89 09/06/2020 1018   BILITOT 0.6 09/06/2020 1018   GFRNONAA 58 (L) 09/06/2020 1018   GFRAA >60 09/11/2019 0950    No results found for: SPEP, UPEP  Lab Results  Component Value Date   WBC 3.3 (L) 09/06/2020   NEUTROABS 2.2 09/06/2020   HGB 12.5 09/06/2020   HCT 37.4 09/06/2020   MCV 94.9 09/06/2020   PLT 129 (L) 09/06/2020      Chemistry      Component Value Date/Time   NA 143 09/06/2020 1018   K 4.2 09/06/2020 1018   CL 106 09/06/2020 1018   CO2 25 09/06/2020 1018   BUN 14 09/06/2020 1018   CREATININE 0.93 09/06/2020 1018   CREATININE 0.88 02/05/2019 1052      Component Value Date/Time   CALCIUM 9.3 09/06/2020 1018   ALKPHOS 89 09/06/2020  1018   AST 35 09/06/2020 1018   ALT 111 (H) 09/06/2020 1018   BILITOT 0.6 09/06/2020 1018

## 2020-09-06 NOTE — Assessment & Plan Note (Signed)
Plan to reduce the dose of chemotherapy and adjust based on her most current weight

## 2020-09-06 NOTE — Assessment & Plan Note (Signed)
So far, she tolerated treatment very well except for constipation, elevated liver enzymes of unknown reason and 7 pound weight loss which she attributed to changes in her diet She is getting slightly more pancytopenic I plan to reduce treatment based on her adjusted weight and reduce carboplatin to AUC of 3.5 I recommend minimum 3-4 cycles of treatment before repeat imaging study

## 2020-09-06 NOTE — Assessment & Plan Note (Signed)
This is multifactorial, likely due to treatment She is not symptomatic We will proceed with treatment with dose adjustment as above

## 2020-09-06 NOTE — Assessment & Plan Note (Signed)
We discussed aggressive laxative therapy She is recommended to take MiraLAX daily for at least 3 days She is instructed to call if MiraLAX is not helping

## 2020-09-06 NOTE — Progress Notes (Signed)
Ok to proceed with ALT of 111 per Heath Lark, MD

## 2020-09-06 NOTE — Assessment & Plan Note (Signed)
The cause is unknown Observe closely for now We will proceed with treatment without delay

## 2020-09-06 NOTE — Patient Instructions (Signed)
[facility] Discharge Instructions for Patients Receiving Chemotherapy  Today you received the following chemotherapy agents: Carboplatin (Paraplatin)  To help prevent nausea and vomiting after your treatment, we encourage you to take your nausea medication as directed by your provider   If you develop nausea and vomiting that is not controlled by your nausea medication, call the clinic.   BELOW ARE SYMPTOMS THAT SHOULD BE REPORTED IMMEDIATELY:  *FEVER GREATER THAN 100.5 F  *CHILLS WITH OR WITHOUT FEVER  NAUSEA AND VOMITING THAT IS NOT CONTROLLED WITH YOUR NAUSEA MEDICATION  *UNUSUAL SHORTNESS OF BREATH  *UNUSUAL BRUISING OR BLEEDING  TENDERNESS IN MOUTH AND THROAT WITH OR WITHOUT PRESENCE OF ULCERS  *URINARY PROBLEMS  *BOWEL PROBLEMS  UNUSUAL RASH Items with * indicate a potential emergency and should be followed up as soon as possible.  Feel free to call the clinic should you have any questions or concerns. The clinic phone number is [phone].  Please show the [place] at check-in to the Emergency Department and triage nurse.

## 2020-09-15 ENCOUNTER — Encounter: Payer: Self-pay | Admitting: Hematology and Oncology

## 2020-09-15 NOTE — Progress Notes (Signed)
Called pt to introduce myself as her Arboriculturist.  Pt has 2 insurances so copay assistance shouldn't be needed.  I informed her of the [employer] and went over what it covers.  She would like to apply so she will bring her proof of income on 09/27/20.  If approved I will give her an expense sheet and my card for any questions or concerns she may have in the future.

## 2020-09-27 ENCOUNTER — Encounter: Payer: Self-pay | Admitting: Hematology and Oncology

## 2020-09-27 ENCOUNTER — Inpatient Hospital Stay: Payer: Medicare Other | Attending: Hematology and Oncology | Admitting: Hematology and Oncology

## 2020-09-27 ENCOUNTER — Inpatient Hospital Stay: Payer: Medicare Other

## 2020-09-27 ENCOUNTER — Other Ambulatory Visit: Payer: Self-pay

## 2020-09-27 DIAGNOSIS — C52 Malignant neoplasm of vagina: Secondary | ICD-10-CM

## 2020-09-27 DIAGNOSIS — Z5111 Encounter for antineoplastic chemotherapy: Secondary | ICD-10-CM | POA: Diagnosis not present

## 2020-09-27 DIAGNOSIS — K5909 Other constipation: Secondary | ICD-10-CM

## 2020-09-27 DIAGNOSIS — R600 Localized edema: Secondary | ICD-10-CM | POA: Diagnosis not present

## 2020-09-27 DIAGNOSIS — D61818 Other pancytopenia: Secondary | ICD-10-CM

## 2020-09-27 LAB — COMPREHENSIVE METABOLIC PANEL
ALT: 12 U/L (ref 0–44)
AST: 21 U/L (ref 15–41)
Albumin: 4.1 g/dL (ref 3.5–5.0)
Alkaline Phosphatase: 66 U/L (ref 38–126)
Anion gap: 11 (ref 5–15)
BUN: 16 mg/dL (ref 8–23)
CO2: 27 mmol/L (ref 22–32)
Calcium: 9.2 mg/dL (ref 8.9–10.3)
Chloride: 105 mmol/L (ref 98–111)
Creatinine, Ser: 0.84 mg/dL (ref 0.44–1.00)
GFR, Estimated: >60 mL/min (ref >60)
Glucose, Bld: 97 mg/dL (ref 70–99)
Potassium: 3.8 mmol/L (ref 3.5–5.1)
Sodium: 143 mmol/L (ref 135–145)
Total Bilirubin: 0.5 mg/dL (ref 0.3–1.2)
Total Protein: 7.6 g/dL (ref 6.5–8.1)

## 2020-09-27 LAB — CBC WITH DIFFERENTIAL (CANCER CENTER ONLY)
Abs Immature Granulocytes: 0.01 10*3/uL (ref 0.00–0.07)
Basophils Absolute: 0 10*3/uL (ref 0.0–0.1)
Basophils Relative: 1 %
Eosinophils Absolute: 0 10*3/uL (ref 0.0–0.5)
Eosinophils Relative: 1 %
HCT: 36 % (ref 36.0–46.0)
Hemoglobin: 12.1 g/dL (ref 12.0–15.0)
Immature Granulocytes: 0 %
Lymphocytes Relative: 17 %
Lymphs Abs: 0.6 10*3/uL — ABNORMAL LOW (ref 0.7–4.0)
MCH: 31.6 pg (ref 26.0–34.0)
MCHC: 33.6 g/dL (ref 30.0–36.0)
MCV: 94 fL (ref 80.0–100.0)
Monocytes Absolute: 0.5 10*3/uL (ref 0.1–1.0)
Monocytes Relative: 15 %
Neutro Abs: 2.3 10*3/uL (ref 1.7–7.7)
Neutrophils Relative %: 66 %
Platelet Count: 132 10*3/uL — ABNORMAL LOW (ref 150–400)
RBC: 3.83 MIL/uL — ABNORMAL LOW (ref 3.87–5.11)
RDW: 15.9 % — ABNORMAL HIGH (ref 11.5–15.5)
WBC Count: 3.4 10*3/uL — ABNORMAL LOW (ref 4.0–10.5)
nRBC: 0 % (ref 0.0–0.2)

## 2020-09-27 MED ORDER — PALONOSETRON HCL INJECTION 0.25 MG/5ML
0.2500 mg | Freq: Once | INTRAVENOUS | Status: AC
Start: 2020-09-27 — End: 2020-09-27
  Administered 2020-09-27: 0.25 mg via INTRAVENOUS

## 2020-09-27 MED ORDER — PALONOSETRON HCL INJECTION 0.25 MG/5ML
INTRAVENOUS | Status: AC
  Filled 2020-09-27: qty 5

## 2020-09-27 MED ORDER — SODIUM CHLORIDE 0.9 % IV SOLN
150.0000 mg | Freq: Once | INTRAVENOUS | Status: AC
  Administered 2020-09-27: 150 mg via INTRAVENOUS
  Filled 2020-09-27: qty 150

## 2020-09-27 MED ORDER — SODIUM CHLORIDE 0.9 % IV SOLN
Freq: Once | INTRAVENOUS | Status: AC
Start: 2020-09-27 — End: 2020-09-27
  Filled 2020-09-27: qty 250

## 2020-09-27 MED ORDER — SODIUM CHLORIDE 0.9 % IV SOLN
10.0000 mg | Freq: Once | INTRAVENOUS | Status: AC
  Administered 2020-09-27: 10 mg via INTRAVENOUS
  Filled 2020-09-27: qty 10

## 2020-09-27 MED ORDER — SODIUM CHLORIDE 0.9 % IV SOLN
291.5500 mg | Freq: Once | INTRAVENOUS | Status: AC
  Administered 2020-09-27: 290 mg via INTRAVENOUS
  Filled 2020-09-27: qty 29

## 2020-09-27 NOTE — Progress Notes (Signed)
[place] OFFICE PROGRESS NOTE  Patient Care Team: [name], [name], MD as PCP - General (Family Medicine) Everitt Amber, MD as Consulting Physician (Gynecologic Oncology)  ASSESSMENT & PLAN:  Vaginal cancer [facility]) So far, she tolerated treatment very well except for constipation and slight weight loss which she attributed to changes in her diet She tolerated recent dose adjustment well and her counts are better I recommend minimum 4 cycles of treatment before repeat imaging study   Pancytopenia, acquired ([place]) Her blood counts are better with recent dose adjustment We will proceed with treatment with similar dose adjustment as before  Other constipation She is doing better with laxative therapy She will continue the same  Bilateral lower extremity edema Her leg swelling has improved/stable Observe closely   No orders of the defined types were placed in this encounter.   All questions were answered. The patient knows to call the clinic with any problems, questions or concerns. The total time spent in the appointment was 20 minutes encounter with patients including review of chart and various tests results, discussions about plan of care and coordination of care plan   Heath Lark, MD 09/27/2020 11:06 AM  INTERVAL HISTORY: Please see below for problem oriented charting. She returns with her daughter for further follow-up She tolerated last cycle of treatment well She has lost a bit of weight but overall eats normal She is taking laxatives to avoid constipation She has noticed some recent occasional vaginal bleeding  SUMMARY OF ONCOLOGIC HISTORY: Oncology History Overview Note  BRAF neg, HMB-45 is negative PD-L1 0%   Vaginal cancer ([place])  01/28/2019 Pathology Results   Vagina, biopsy - POORLY DIFFERENTIATED MALIGNANT NEOPLASM. - SEE MICROSCOPIC DESCRIPTION Microscopic Comment The neoplasm is characterized by diffuse sheets of markedly atypical cells with  nuclear enlargement and nucleoli. A battery of immunohistochemical stains shows positivity with Melan-A, SOX-10, vimentin and CD117. The tumor is negative with cytokeratin AE1/AE3, cytokeratin 903, cytokeratin 7, cytokeratin 8/18, CD34, CD56, CD45, CD138, cytokeratin 5/6, chromogranin, desmin, epithelial membrane antigen, estrogen receptor, Factor XIII-A, GATA-3, GCDFP, inhibin, muscle specific actin, p63, PAX-8, progesterone receptor, synaptophysin, TTF-1, WT-1, smooth muscle actin, CD10, CD31, cytokeratin 20 and S100. The morphologic and immunophenotypic differential diagnosis includes malignant melanoma and clear cell sarcoma. Molecular genetic studies can be performed if requested   02/06/2019 Imaging   MRI pelvis 5.8 cm vaginal soft tissue mass with central necrosis, which shows a focal area of invasion through the inferior left levator ani muscle.   No evidence of pelvic lymphadenopathy.   02/14/2019 Imaging   MRI brain 1. 2.3 cm extra-axial mass along the right aspect of the clivus/posterior clinoid process consistent with a meningioma. 2. No evidence of intracranial metastases. 3. Moderate chronic small vessel ischemic disease.   02/17/2019 PET scan   1. Soft tissue mass within the vagina with central necrosis is again noted and is intensely FDG avid. 2. No specific findings to suggest nodal metastasis or distant metastatic disease. 3. There is a low-density subcutaneous soft tissue nodule within the right side of neck which is FDG avid. The CT appearance favors a sebaceous cyst. If inflamed/infected this may account for the increased uptake. Metastatic disease is less favored but not 100% excluded. Clinical correlation and biopsy if indicated. 4. Gallstones 5.  Aortic Atherosclerosis (ICD10-I70.0).     02/19/2019 Cancer Staging   Staging form: Vagina, AJCC 8th Edition - Clinical: Stage III (cT3, cN0, cM0) - Signed by Heath Lark, MD on 02/19/2019  06/13/2019 PET scan   1. Considerable  improvement in size and activity of the vaginal mass, current maximum SUV 6.2, previously 14.8. No findings of adenopathy or metastatic disease at this time. 2. Resolution of the prior hypermetabolic subcutaneous lesion along the right posterior neck, which was probably inflammatory. 3. Other imaging findings of potential clinical significance: Aortic Atherosclerosis (ICD10-I70.0). Mild cardiomegaly. Cholelithiasis.   07/20/2020 Pathology Results   A. VAGINA, LEFT WALL, BIOPSY:  - Poorly differentiated malignant neoplasm, see comment.   COMMENT:   The morphology is similar to the patient's prior biopsy ([id number]). Additional studies can be performed upon request.    08/16/2020 -  Chemotherapy    Patient is on Treatment Plan: VULVAR CANCER CARBOPLATIN Q21D        REVIEW OF SYSTEMS:   Constitutional: Denies fevers, chills or abnormal weight loss Eyes: Denies blurriness of vision Ears, nose, mouth, throat, and face: Denies mucositis or sore throat Respiratory: Denies cough, dyspnea or wheezes Cardiovascular: Denies palpitation, chest discomfort  Skin: Denies abnormal skin rashes Lymphatics: Denies new lymphadenopathy or easy bruising Neurological:Denies numbness, tingling or new weaknesses Behavioral/Psych: Mood is stable, no new changes  All other systems were reviewed with the patient and are negative.  I have reviewed the past medical history, past surgical history, social history and family history with the patient and they are unchanged from previous note.  ALLERGIES:  has No Known Allergies.  MEDICATIONS:  Current Outpatient Medications  Medication Sig Dispense Refill  . Calcium Carbonate (CALCIUM 600 PO) Take 2 tablets by mouth daily.     . diclofenac Sodium (VOLTAREN) 1 % GEL APPLY 4 G TOPICALLY 4 (FOUR) TIMES DAILY. 200 g 4  . fluticasone (FLONASE) 50 MCG/ACT nasal spray Place 1 spray into both nostrils at bedtime as needed for allergies or rhinitis. 16 mL 3  .  hydrochlorothiazide (HYDRODIURIL) 25 MG tablet Take 1 tablet (25 mg total) by mouth daily. 90 tablet 1  . Multiple Vitamins-Minerals (MULTIPLE VITAMINS/WOMENS PO) Take 1 tablet by mouth daily.     . Omega-3 Fatty Acids (FISH OIL) 1200 MG CAPS Take 2,400 mg by mouth daily.    . ondansetron (ZOFRAN) 8 MG tablet Take 1 tablet (8 mg total) by mouth every 8 (eight) hours as needed. 30 tablet 1  . polyethylene glycol (MIRALAX / GLYCOLAX) 17 g packet Take 17 g by mouth daily.    . prochlorperazine (COMPAZINE) 10 MG tablet Take 1 tablet (10 mg total) by mouth every 6 (six) hours as needed (Nausea or vomiting). 30 tablet 1  . Trospium Chloride 60 MG CP24 TAKE 1 CAPSULE BY MOUTH EVERY DAY 30 capsule 11   No current facility-administered medications for this visit.    PHYSICAL EXAMINATION: ECOG PERFORMANCE STATUS: 1 - Symptomatic but completely ambulatory  Vitals:   09/27/20 1027  BP: 124/81  Pulse: 99  Resp: 18  Temp: 97.8 F (36.6 C)  SpO2: 100%   Filed Weights   09/27/20 1027  Weight: 217 lb 12.8 oz (98.8 kg)    GENERAL:alert, no distress and comfortable SKIN: skin color, texture, turgor are normal, no rashes or significant lesions EYES: normal, Conjunctiva are pink and non-injected, sclera clear OROPHARYNX:no exudate, no erythema and lips, buccal mucosa, and tongue normal  NECK: supple, thyroid normal size, non-tender, without nodularity LYMPH:  no palpable lymphadenopathy in the cervical, axillary or inguinal LUNGS: clear to auscultation and percussion with normal breathing effort HEART: regular rate & rhythm and no murmurs and no lower extremity edema  ABDOMEN:abdomen soft, non-tender and normal bowel sounds Musculoskeletal:no cyanosis of digits and no clubbing  NEURO: alert & oriented x 3 with fluent speech, no focal motor/sensory deficits  LABORATORY DATA:  I have reviewed the data as listed    Component Value Date/Time   NA 143 09/27/2020 0959   K 3.8 09/27/2020 0959   CL  105 09/27/2020 0959   CO2 27 09/27/2020 0959   GLUCOSE 97 09/27/2020 0959   BUN 16 09/27/2020 0959   CREATININE 0.84 09/27/2020 0959   CREATININE 0.88 02/05/2019 1052   CALCIUM 9.2 09/27/2020 0959   PROT 7.6 09/27/2020 0959   ALBUMIN 4.1 09/27/2020 0959   AST 21 09/27/2020 0959   ALT 12 09/27/2020 0959   ALKPHOS 66 09/27/2020 0959   BILITOT 0.5 09/27/2020 0959   GFRNONAA >60 09/27/2020 0959   GFRAA >60 09/11/2019 0950    No results found for: SPEP, UPEP  Lab Results  Component Value Date   WBC 3.4 (L) 09/27/2020   NEUTROABS 2.3 09/27/2020   HGB 12.1 09/27/2020   HCT 36.0 09/27/2020   MCV 94.0 09/27/2020   PLT 132 (L) 09/27/2020      Chemistry      Component Value Date/Time   NA 143 09/27/2020 0959   K 3.8 09/27/2020 0959   CL 105 09/27/2020 0959   CO2 27 09/27/2020 0959   BUN 16 09/27/2020 0959   CREATININE 0.84 09/27/2020 0959   CREATININE 0.88 02/05/2019 1052      Component Value Date/Time   CALCIUM 9.2 09/27/2020 0959   ALKPHOS 66 09/27/2020 0959   AST 21 09/27/2020 0959   ALT 12 09/27/2020 0959   BILITOT 0.5 09/27/2020 0959

## 2020-09-27 NOTE — Assessment & Plan Note (Signed)
She is doing better with laxative therapy She will continue the same

## 2020-09-27 NOTE — Assessment & Plan Note (Signed)
So far, she tolerated treatment very well except for constipation and slight weight loss which she attributed to changes in her diet She tolerated recent dose adjustment well and her counts are better I recommend minimum 4 cycles of treatment before repeat imaging study

## 2020-09-27 NOTE — Progress Notes (Signed)
Pt is approved for the $1000 [employer] grant.  

## 2020-09-27 NOTE — Assessment & Plan Note (Signed)
Her leg swelling has improved/stable Observe closely

## 2020-09-27 NOTE — Assessment & Plan Note (Signed)
Her blood counts are better with recent dose adjustment We will proceed with treatment with similar dose adjustment as before

## 2020-09-27 NOTE — Patient Instructions (Signed)
[facility] Discharge Instructions for Patients Receiving Chemotherapy  Today you received the following chemotherapy agents: Carboplatin (Paraplatin)  To help prevent nausea and vomiting after your treatment, we encourage you to take your nausea medication as directed by your provider   If you develop nausea and vomiting that is not controlled by your nausea medication, call the clinic.   BELOW ARE SYMPTOMS THAT SHOULD BE REPORTED IMMEDIATELY:  *FEVER GREATER THAN 100.5 F  *CHILLS WITH OR WITHOUT FEVER  NAUSEA AND VOMITING THAT IS NOT CONTROLLED WITH YOUR NAUSEA MEDICATION  *UNUSUAL SHORTNESS OF BREATH  *UNUSUAL BRUISING OR BLEEDING  TENDERNESS IN MOUTH AND THROAT WITH OR WITHOUT PRESENCE OF ULCERS  *URINARY PROBLEMS  *BOWEL PROBLEMS  UNUSUAL RASH Items with * indicate a potential emergency and should be followed up as soon as possible.  Feel free to call the clinic should you have any questions or concerns. The clinic phone number is [phone].  Please show the CHEMO ALERT CARD at check-in to the Emergency Department and triage nurse.   

## 2020-10-18 ENCOUNTER — Other Ambulatory Visit: Payer: Self-pay

## 2020-10-18 ENCOUNTER — Inpatient Hospital Stay (HOSPITAL_BASED_OUTPATIENT_CLINIC_OR_DEPARTMENT_OTHER): Payer: Medicare Other | Admitting: Hematology and Oncology

## 2020-10-18 ENCOUNTER — Inpatient Hospital Stay: Payer: Medicare Other

## 2020-10-18 ENCOUNTER — Encounter: Payer: Self-pay | Admitting: Hematology and Oncology

## 2020-10-18 VITALS — BP 123/57 | HR 93 | Temp 97.5°F | Resp 18 | Ht 70.0 in | Wt 219.0 lb

## 2020-10-18 DIAGNOSIS — D61818 Other pancytopenia: Secondary | ICD-10-CM

## 2020-10-18 DIAGNOSIS — R600 Localized edema: Secondary | ICD-10-CM | POA: Diagnosis not present

## 2020-10-18 DIAGNOSIS — C52 Malignant neoplasm of vagina: Secondary | ICD-10-CM | POA: Diagnosis not present

## 2020-10-18 DIAGNOSIS — Z5111 Encounter for antineoplastic chemotherapy: Secondary | ICD-10-CM | POA: Diagnosis not present

## 2020-10-18 LAB — COMPREHENSIVE METABOLIC PANEL
ALT: 12 U/L (ref 0–44)
AST: 17 U/L (ref 15–41)
Albumin: 3.7 g/dL (ref 3.5–5.0)
Alkaline Phosphatase: 56 U/L (ref 38–126)
Anion gap: 9 (ref 5–15)
BUN: 18 mg/dL (ref 8–23)
CO2: 27 mmol/L (ref 22–32)
Calcium: 9.2 mg/dL (ref 8.9–10.3)
Chloride: 108 mmol/L (ref 98–111)
Creatinine, Ser: 0.82 mg/dL (ref 0.44–1.00)
GFR, Estimated: >60 mL/min (ref >60)
Glucose, Bld: 103 mg/dL — ABNORMAL HIGH (ref 70–99)
Potassium: 3.8 mmol/L (ref 3.5–5.1)
Sodium: 144 mmol/L (ref 135–145)
Total Bilirubin: 0.6 mg/dL (ref 0.3–1.2)
Total Protein: 7.2 g/dL (ref 6.5–8.1)

## 2020-10-18 LAB — CBC WITH DIFFERENTIAL (CANCER CENTER ONLY)
Abs Immature Granulocytes: 0.02 10*3/uL (ref 0.00–0.07)
Basophils Absolute: 0 10*3/uL (ref 0.0–0.1)
Basophils Relative: 1 %
Eosinophils Absolute: 0 10*3/uL (ref 0.0–0.5)
Eosinophils Relative: 1 %
HCT: 34.2 % — ABNORMAL LOW (ref 36.0–46.0)
Hemoglobin: 11.6 g/dL — ABNORMAL LOW (ref 12.0–15.0)
Immature Granulocytes: 1 %
Lymphocytes Relative: 14 %
Lymphs Abs: 0.5 10*3/uL — ABNORMAL LOW (ref 0.7–4.0)
MCH: 32.5 pg (ref 26.0–34.0)
MCHC: 33.9 g/dL (ref 30.0–36.0)
MCV: 95.8 fL (ref 80.0–100.0)
Monocytes Absolute: 0.6 10*3/uL (ref 0.1–1.0)
Monocytes Relative: 14 %
Neutro Abs: 2.8 10*3/uL (ref 1.7–7.7)
Neutrophils Relative %: 69 %
Platelet Count: 163 10*3/uL (ref 150–400)
RBC: 3.57 MIL/uL — ABNORMAL LOW (ref 3.87–5.11)
RDW: 16.3 % — ABNORMAL HIGH (ref 11.5–15.5)
WBC Count: 4 10*3/uL (ref 4.0–10.5)
nRBC: 0 % (ref 0.0–0.2)

## 2020-10-18 MED ORDER — PALONOSETRON HCL INJECTION 0.25 MG/5ML
0.2500 mg | Freq: Once | INTRAVENOUS | Status: AC
  Administered 2020-10-18: 0.25 mg via INTRAVENOUS

## 2020-10-18 MED ORDER — FOSAPREPITANT DIMEGLUMINE INJECTION 150 MG
150.0000 mg | Freq: Once | INTRAVENOUS | Status: AC
  Administered 2020-10-18: 150 mg via INTRAVENOUS
  Filled 2020-10-18: qty 150

## 2020-10-18 MED ORDER — SODIUM CHLORIDE 0.9 % IV SOLN
Freq: Once | INTRAVENOUS | Status: AC
  Filled 2020-10-18: qty 250

## 2020-10-18 MED ORDER — SODIUM CHLORIDE 0.9% FLUSH
10.0000 mL | INTRAVENOUS | Status: DC | PRN
  Filled 2020-10-18: qty 10

## 2020-10-18 MED ORDER — HEPARIN SOD (PORK) LOCK FLUSH 100 UNIT/ML IV SOLN
500.0000 [IU] | Freq: Once | INTRAVENOUS | Status: DC | PRN
  Filled 2020-10-18: qty 5

## 2020-10-18 MED ORDER — SODIUM CHLORIDE 0.9 % IV SOLN
287.7000 mg | Freq: Once | INTRAVENOUS | Status: AC
  Administered 2020-10-18: 290 mg via INTRAVENOUS
  Filled 2020-10-18: qty 29

## 2020-10-18 MED ORDER — PALONOSETRON HCL INJECTION 0.25 MG/5ML
INTRAVENOUS | Status: AC
  Filled 2020-10-18: qty 5

## 2020-10-18 MED ORDER — SODIUM CHLORIDE 0.9 % IV SOLN
10.0000 mg | Freq: Once | INTRAVENOUS | Status: AC
  Administered 2020-10-18: 10 mg via INTRAVENOUS
  Filled 2020-10-18: qty 10

## 2020-10-18 NOTE — Progress Notes (Signed)
[place] OFFICE PROGRESS NOTE  Patient Care Team: [name], [name], MD as PCP - General (Family Medicine) Everitt Amber, MD as Consulting Physician (Gynecologic Oncology)  ASSESSMENT & PLAN:  Vaginal cancer [facility]) She tolerated treatment very well except for occasional vaginal bleeding She has no side effects from treatment so far We will proceed with treatment as scheduled I recommend PET CT scan before her next treatment  Bilateral lower extremity edema Her leg swelling is stable, mildly worse because she skipped her diuretic treatment today She will continue her prescribed treatment as directed  observe closely  Pancytopenia, acquired [facility]) She has intermittent pancytopenia from treatment Her anemia is stable and her platelet count is good Observe closely   No orders of the defined types were placed in this encounter.   All questions were answered. The patient knows to call the clinic with any problems, questions or concerns. The total time spent in the appointment was 20 minutes encounter with patients including review of chart and various tests results, discussions about plan of care and coordination of care plan   Heath Lark, MD 10/18/2020 11:25 AM  INTERVAL HISTORY: Please see below for problem oriented charting. She returns for her treatment with her daughter She tolerated last cycle of therapy well No recent nausea vomiting or changes in bowel habits She has occasional intermittent vaginal bleeding but not significant She felt that her leg swelling is slightly worse today because she skipped her diuretic treatment She usually does not like going to the bathroom during treatment and when she resumed regular diuretic therapy, her leg swelling will improve  SUMMARY OF ONCOLOGIC HISTORY: Oncology History Overview Note  BRAF neg, HMB-45 is negative PD-L1 0%   Vaginal cancer ([place])  01/28/2019 Pathology Results   Vagina, biopsy - POORLY DIFFERENTIATED  MALIGNANT NEOPLASM. - SEE MICROSCOPIC DESCRIPTION Microscopic Comment The neoplasm is characterized by diffuse sheets of markedly atypical cells with nuclear enlargement and nucleoli. A battery of immunohistochemical stains shows positivity with Melan-A, SOX-10, vimentin and CD117. The tumor is negative with cytokeratin AE1/AE3, cytokeratin 903, cytokeratin 7, cytokeratin 8/18, CD34, CD56, CD45, CD138, cytokeratin 5/6, chromogranin, desmin, epithelial membrane antigen, estrogen receptor, Factor XIII-A, GATA-3, GCDFP, inhibin, muscle specific actin, p63, PAX-8, progesterone receptor, synaptophysin, TTF-1, WT-1, smooth muscle actin, CD10, CD31, cytokeratin 20 and S100. The morphologic and immunophenotypic differential diagnosis includes malignant melanoma and clear cell sarcoma. Molecular genetic studies can be performed if requested   02/06/2019 Imaging   MRI pelvis 5.8 cm vaginal soft tissue mass with central necrosis, which shows a focal area of invasion through the inferior left levator ani muscle.   No evidence of pelvic lymphadenopathy.   02/14/2019 Imaging   MRI brain 1. 2.3 cm extra-axial mass along the right aspect of the clivus/posterior clinoid process consistent with a meningioma. 2. No evidence of intracranial metastases. 3. Moderate chronic small vessel ischemic disease.   02/17/2019 PET scan   1. Soft tissue mass within the vagina with central necrosis is again noted and is intensely FDG avid. 2. No specific findings to suggest nodal metastasis or distant metastatic disease. 3. There is a low-density subcutaneous soft tissue nodule within the right side of neck which is FDG avid. The CT appearance favors a sebaceous cyst. If inflamed/infected this may account for the increased uptake. Metastatic disease is less favored but not 100% excluded. Clinical correlation and biopsy if indicated. 4. Gallstones 5.  Aortic Atherosclerosis (ICD10-I70.0).     02/19/2019 Cancer Staging   Staging  form: Vagina, AJCC 8th Edition - Clinical: Stage III (cT3, cN0, cM0) - Signed by Heath Lark, MD on 02/19/2019   06/13/2019 PET scan   1. Considerable improvement in size and activity of the vaginal mass, current maximum SUV 6.2, previously 14.8. No findings of adenopathy or metastatic disease at this time. 2. Resolution of the prior hypermetabolic subcutaneous lesion along the right posterior neck, which was probably inflammatory. 3. Other imaging findings of potential clinical significance: Aortic Atherosclerosis (ICD10-I70.0). Mild cardiomegaly. Cholelithiasis.   07/20/2020 Pathology Results   A. VAGINA, LEFT WALL, BIOPSY:  - Poorly differentiated malignant neoplasm, see comment.   COMMENT:   The morphology is similar to the patient's prior biopsy ([id number]). Additional studies can be performed upon request.    08/16/2020 -  Chemotherapy    Patient is on Treatment Plan: VULVAR CANCER CARBOPLATIN Q21D        REVIEW OF SYSTEMS:   Constitutional: Denies fevers, chills or abnormal weight loss Eyes: Denies blurriness of vision Ears, nose, mouth, throat, and face: Denies mucositis or sore throat Respiratory: Denies cough, dyspnea or wheezes Cardiovascular: Denies palpitation, chest discomfort  Gastrointestinal:  Denies nausea, heartburn or change in bowel habits Skin: Denies abnormal skin rashes Lymphatics: Denies new lymphadenopathy or easy bruising Neurological:Denies numbness, tingling or new weaknesses Behavioral/Psych: Mood is stable, no new changes  All other systems were reviewed with the patient and are negative.  I have reviewed the past medical history, past surgical history, social history and family history with the patient and they are unchanged from previous note.  ALLERGIES:  has No Known Allergies.  MEDICATIONS:  Current Outpatient Medications  Medication Sig Dispense Refill  . Calcium Carbonate (CALCIUM 600 PO) Take 2 tablets by mouth daily.     . diclofenac  Sodium (VOLTAREN) 1 % GEL APPLY 4 G TOPICALLY 4 (FOUR) TIMES DAILY. 200 g 4  . fluticasone (FLONASE) 50 MCG/ACT nasal spray Place 1 spray into both nostrils at bedtime as needed for allergies or rhinitis. 16 mL 3  . hydrochlorothiazide (HYDRODIURIL) 25 MG tablet Take 1 tablet (25 mg total) by mouth daily. 90 tablet 1  . Multiple Vitamins-Minerals (MULTIPLE VITAMINS/WOMENS PO) Take 1 tablet by mouth daily.     . Omega-3 Fatty Acids (FISH OIL) 1200 MG CAPS Take 2,400 mg by mouth daily.    . ondansetron (ZOFRAN) 8 MG tablet Take 1 tablet (8 mg total) by mouth every 8 (eight) hours as needed. 30 tablet 1  . polyethylene glycol (MIRALAX / GLYCOLAX) 17 g packet Take 17 g by mouth daily.    . prochlorperazine (COMPAZINE) 10 MG tablet Take 1 tablet (10 mg total) by mouth every 6 (six) hours as needed (Nausea or vomiting). 30 tablet 1  . Trospium Chloride 60 MG CP24 TAKE 1 CAPSULE BY MOUTH EVERY DAY 30 capsule 11   No current facility-administered medications for this visit.    PHYSICAL EXAMINATION: ECOG PERFORMANCE STATUS: 1 - Symptomatic but completely ambulatory  Vitals:   10/18/20 1101  BP: (!) 123/57  Pulse: 93  Resp: 18  Temp: (!) 97.5 F (36.4 C)  SpO2: 100%   Filed Weights   10/18/20 1101  Weight: 219 lb (99.3 kg)    GENERAL:alert, no distress and comfortable SKIN: skin color, texture, turgor are normal, no rashes or significant lesions EYES: normal, Conjunctiva are pink and non-injected, sclera clear OROPHARYNX:no exudate, no erythema and lips, buccal mucosa, and tongue normal  NECK: supple, thyroid normal size, non-tender, without nodularity LYMPH:  no  palpable lymphadenopathy in the cervical, axillary or inguinal LUNGS: clear to auscultation and percussion with normal breathing effort HEART: regular rate & rhythm and no murmurs with mild bilateral lower extremity edema ABDOMEN:abdomen soft, non-tender and normal bowel sounds Musculoskeletal:no cyanosis of digits and no  clubbing  NEURO: alert & oriented x 3 with fluent speech, no focal motor/sensory deficits  LABORATORY DATA:  I have reviewed the data as listed    Component Value Date/Time   NA 144 10/18/2020 1023   K 3.8 10/18/2020 1023   CL 108 10/18/2020 1023   CO2 27 10/18/2020 1023   GLUCOSE 103 (H) 10/18/2020 1023   BUN 18 10/18/2020 1023   CREATININE 0.82 10/18/2020 1023   CREATININE 0.88 02/05/2019 1052   CALCIUM 9.2 10/18/2020 1023   PROT 7.2 10/18/2020 1023   ALBUMIN 3.7 10/18/2020 1023   AST 17 10/18/2020 1023   ALT 12 10/18/2020 1023   ALKPHOS 56 10/18/2020 1023   BILITOT 0.6 10/18/2020 1023   GFRNONAA >60 10/18/2020 1023   GFRAA >60 09/11/2019 0950    No results found for: SPEP, UPEP  Lab Results  Component Value Date   WBC 4.0 10/18/2020   NEUTROABS 2.8 10/18/2020   HGB 11.6 (L) 10/18/2020   HCT 34.2 (L) 10/18/2020   MCV 95.8 10/18/2020   PLT 163 10/18/2020      Chemistry      Component Value Date/Time   NA 144 10/18/2020 1023   K 3.8 10/18/2020 1023   CL 108 10/18/2020 1023   CO2 27 10/18/2020 1023   BUN 18 10/18/2020 1023   CREATININE 0.82 10/18/2020 1023   CREATININE 0.88 02/05/2019 1052      Component Value Date/Time   CALCIUM 9.2 10/18/2020 1023   ALKPHOS 56 10/18/2020 1023   AST 17 10/18/2020 1023   ALT 12 10/18/2020 1023   BILITOT 0.6 10/18/2020 1023

## 2020-10-18 NOTE — Patient Instructions (Signed)
[facility] Discharge Instructions for Patients Receiving Chemotherapy  Today you received the following chemotherapy agents: Carboplatin (Paraplatin)  To help prevent nausea and vomiting after your treatment, we encourage you to take your nausea medication as directed by your provider   If you develop nausea and vomiting that is not controlled by your nausea medication, call the clinic.   BELOW ARE SYMPTOMS THAT SHOULD BE REPORTED IMMEDIATELY:  *FEVER GREATER THAN 100.5 F  *CHILLS WITH OR WITHOUT FEVER  NAUSEA AND VOMITING THAT IS NOT CONTROLLED WITH YOUR NAUSEA MEDICATION  *UNUSUAL SHORTNESS OF BREATH  *UNUSUAL BRUISING OR BLEEDING  TENDERNESS IN MOUTH AND THROAT WITH OR WITHOUT PRESENCE OF ULCERS  *URINARY PROBLEMS  *BOWEL PROBLEMS  UNUSUAL RASH Items with * indicate a potential emergency and should be followed up as soon as possible.  Feel free to call the clinic should you have any questions or concerns. The clinic phone number is [phone].  Please show the CHEMO ALERT CARD at check-in to the Emergency Department and triage nurse.   

## 2020-10-18 NOTE — Assessment & Plan Note (Signed)
She tolerated treatment very well except for occasional vaginal bleeding She has no side effects from treatment so far We will proceed with treatment as scheduled I recommend PET CT scan before her next treatment

## 2020-10-18 NOTE — Assessment & Plan Note (Signed)
Her leg swelling is stable, mildly worse because she skipped her diuretic treatment today She will continue her prescribed treatment as directed  observe closely

## 2020-10-18 NOTE — Assessment & Plan Note (Signed)
She has intermittent pancytopenia from treatment Her anemia is stable and her platelet count is good Observe closely

## 2020-10-19 ENCOUNTER — Other Ambulatory Visit: Payer: Self-pay | Admitting: Hematology and Oncology

## 2020-10-19 DIAGNOSIS — C52 Malignant neoplasm of vagina: Secondary | ICD-10-CM

## 2020-11-03 ENCOUNTER — Ambulatory Visit (INDEPENDENT_AMBULATORY_CARE_PROVIDER_SITE_OTHER): Payer: Medicare Other | Admitting: Family Medicine

## 2020-11-03 ENCOUNTER — Encounter: Payer: Self-pay | Admitting: Family Medicine

## 2020-11-03 ENCOUNTER — Other Ambulatory Visit: Payer: Self-pay

## 2020-11-03 VITALS — BP 140/80 | HR 94 | Resp 16 | Ht 70.0 in | Wt 216.5 lb

## 2020-11-03 DIAGNOSIS — M1712 Unilateral primary osteoarthritis, left knee: Secondary | ICD-10-CM | POA: Diagnosis not present

## 2020-11-03 DIAGNOSIS — I10 Essential (primary) hypertension: Secondary | ICD-10-CM | POA: Diagnosis not present

## 2020-11-03 DIAGNOSIS — I83893 Varicose veins of bilateral lower extremities with other complications: Secondary | ICD-10-CM

## 2020-11-03 DIAGNOSIS — I739 Peripheral vascular disease, unspecified: Secondary | ICD-10-CM | POA: Insufficient documentation

## 2020-11-03 MED ORDER — DICLOFENAC SODIUM 1 % EX GEL: 4.0000 g | Freq: Four times a day (QID) | CUTANEOUS | 4 refills | Status: DC

## 2020-11-03 MED ORDER — LIDOCAINE 3 % EX CREA: 1.0000 "application " | TOPICAL_CREAM | Freq: Two times a day (BID) | CUTANEOUS | 2 refills | Status: DC | PRN

## 2020-11-03 MED ORDER — HYDROCHLOROTHIAZIDE 25 MG PO TABS: 25.0000 mg | ORAL_TABLET | Freq: Every day | ORAL | 2 refills | Status: DC

## 2020-11-03 NOTE — Assessment & Plan Note (Signed)
Problem is a stable. Continue wearing compression stockings. LE elevation above heart level may also help. Appropriate skin care also discussed.

## 2020-11-03 NOTE — Assessment & Plan Note (Signed)
We discussed diagnosis, prognosis, and treatment options. We reviewed some side effects of oral NSAIDs, explained that topical Voltaren is safer. Topical lidocaine helped, so she can continue applying it on left knee 2-3 times per day as needed. Voltaren gel 4 times daily as needed. Fall precautions.

## 2020-11-03 NOTE — Patient Instructions (Addendum)
A few things to remember from today's visit:  If you need refills please call your pharmacy. Do not use My Chart to request refills or for acute issues that need immediate attention.   You can use Lidocaine and voltaren on left knee alternating.  No changes in blood pressure medication.  Compression stocking as needed for varicose veins. Good skin care. Fall precautions.  Please be sure medication list is accurate. If a new problem present, please set up appointment sooner than planned today.
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
## 2020-11-03 NOTE — Assessment & Plan Note (Signed)
BP adequately controlled. Continue HCTZ 25 mg daily and low-salt diet. Continue monitoring BP regularly.

## 2020-11-04 ENCOUNTER — Ambulatory Visit (HOSPITAL_COMMUNITY)
Admission: RE | Admit: 2020-11-04 | Discharge: 2020-11-04 | Disposition: A | Discharge status: Home / Self Care | Payer: Medicare Other | Source: Ambulatory Visit | Attending: Hematology and Oncology | Admitting: Hematology and Oncology

## 2020-11-04 DIAGNOSIS — C52 Malignant neoplasm of vagina: Secondary | ICD-10-CM | POA: Diagnosis not present

## 2020-11-04 DIAGNOSIS — Z79899 Other long term (current) drug therapy: Secondary | ICD-10-CM | POA: Diagnosis not present

## 2020-11-04 LAB — GLUCOSE, CAPILLARY: Glucose-Capillary: 105 mg/dL — ABNORMAL HIGH (ref 70–99)

## 2020-11-04 MED ORDER — FLUDEOXYGLUCOSE F - 18 (FDG) INJECTION
10.0000 | Freq: Once | INTRAVENOUS | Status: AC | PRN
  Administered 2020-11-04: 10.8 via INTRAVENOUS

## 2020-11-05 MED FILL — Fosaprepitant Dimeglumine For IV Infusion 150 MG (Base Eq): INTRAVENOUS | Qty: 5 | Status: AC

## 2020-11-05 MED FILL — Dexamethasone Sodium Phosphate Inj 100 MG/10ML: INTRAMUSCULAR | Qty: 1 | Status: AC

## 2020-11-08 ENCOUNTER — Inpatient Hospital Stay: Payer: Medicare Other

## 2020-11-08 ENCOUNTER — Telehealth: Payer: Self-pay | Admitting: Family Medicine

## 2020-11-08 ENCOUNTER — Inpatient Hospital Stay: Payer: Medicare Other | Attending: Hematology and Oncology | Admitting: Hematology and Oncology

## 2020-11-08 ENCOUNTER — Encounter: Payer: Self-pay | Admitting: Hematology and Oncology

## 2020-11-08 ENCOUNTER — Other Ambulatory Visit: Payer: Self-pay

## 2020-11-08 DIAGNOSIS — C52 Malignant neoplasm of vagina: Secondary | ICD-10-CM | POA: Diagnosis not present

## 2020-11-08 DIAGNOSIS — Z923 Personal history of irradiation: Secondary | ICD-10-CM | POA: Insufficient documentation

## 2020-11-08 DIAGNOSIS — I10 Essential (primary) hypertension: Secondary | ICD-10-CM | POA: Diagnosis not present

## 2020-11-08 DIAGNOSIS — M1990 Unspecified osteoarthritis, unspecified site: Secondary | ICD-10-CM | POA: Insufficient documentation

## 2020-11-08 DIAGNOSIS — Z9221 Personal history of antineoplastic chemotherapy: Secondary | ICD-10-CM | POA: Diagnosis not present

## 2020-11-08 DIAGNOSIS — E669 Obesity, unspecified: Secondary | ICD-10-CM | POA: Insufficient documentation

## 2020-11-08 DIAGNOSIS — Z79899 Other long term (current) drug therapy: Secondary | ICD-10-CM | POA: Diagnosis not present

## 2020-11-08 DIAGNOSIS — Z7189 Other specified counseling: Secondary | ICD-10-CM | POA: Diagnosis not present

## 2020-11-08 MED ORDER — LIDOCAINE 3 % EX CREA: 1.0000 "application " | TOPICAL_CREAM | Freq: Two times a day (BID) | CUTANEOUS | 2 refills | Status: DC | PRN

## 2020-11-08 NOTE — Telephone Encounter (Signed)
Lidocaine 3 % CREA  [facility] [place], [place] - [address] Phone:  [phone]  Fax:  [phone]

## 2020-11-08 NOTE — Assessment & Plan Note (Signed)
Reviewed goals of care with the patient and her daughter I do not believe we have good treatment options moving forward with other systemic chemotherapy I recommend the patient to start thinking about consideration for palliative care with hospice and stopping treatment

## 2020-11-08 NOTE — Progress Notes (Signed)
[place] OFFICE PROGRESS NOTE  Patient Care Team: [name], [name], MD as PCP - General (Family Medicine) Everitt Amber, MD as Consulting Physician (Gynecologic Oncology)  ASSESSMENT & PLAN:  Vaginal cancer [facility]) I have reviewed multiple PET CT scan with the patient and her daughter It appears that she has disease progression while on treatment The patient is bleeding more clinically We discussed treatment options There are no clear benefit to prescribe pembrolizumab in her situation Her disease is rare and treatment options are limited I recommend referral back to GYN surgeon for evaluation to see if her disease can be cauterized or treated in other means We also discussed potential referral for palliative care with hospice service but the patient is interested to pursue more treatment  Goals of care, counseling/discussion Reviewed goals of care with the patient and her daughter I do not believe we have good treatment options moving forward with other systemic chemotherapy I recommend the patient to start thinking about consideration for palliative care with hospice and stopping treatment   No orders of the defined types were placed in this encounter.   All questions were answered. The patient knows to call the clinic with any problems, questions or concerns. The total time spent in the appointment was 30 minutes encounter with patients including review of chart and various tests results, discussions about plan of care and coordination of care plan   Heath Lark, MD 11/08/2020 3:04 PM  INTERVAL HISTORY: Please see below for problem oriented charting. She returns for further follow-up She has noted more profuse bleeding since her last visit She denies pain The patient appears to be interested to pursue more treatment  SUMMARY OF ONCOLOGIC HISTORY: Oncology History Overview Note  BRAF neg, HMB-45 is negative PD-L1 0%   Vaginal cancer ([place])  01/28/2019 Pathology  Results   Vagina, biopsy - POORLY DIFFERENTIATED MALIGNANT NEOPLASM. - SEE MICROSCOPIC DESCRIPTION Microscopic Comment The neoplasm is characterized by diffuse sheets of markedly atypical cells with nuclear enlargement and nucleoli. A battery of immunohistochemical stains shows positivity with Melan-A, SOX-10, vimentin and CD117. The tumor is negative with cytokeratin AE1/AE3, cytokeratin 903, cytokeratin 7, cytokeratin 8/18, CD34, CD56, CD45, CD138, cytokeratin 5/6, chromogranin, desmin, epithelial membrane antigen, estrogen receptor, Factor XIII-A, GATA-3, GCDFP, inhibin, muscle specific actin, p63, PAX-8, progesterone receptor, synaptophysin, TTF-1, WT-1, smooth muscle actin, CD10, CD31, cytokeratin 20 and S100. The morphologic and immunophenotypic differential diagnosis includes malignant melanoma and clear cell sarcoma. Molecular genetic studies can be performed if requested   02/06/2019 Imaging   MRI pelvis 5.8 cm vaginal soft tissue mass with central necrosis, which shows a focal area of invasion through the inferior left levator ani muscle.   No evidence of pelvic lymphadenopathy.   02/14/2019 Imaging   MRI brain 1. 2.3 cm extra-axial mass along the right aspect of the clivus/posterior clinoid process consistent with a meningioma. 2. No evidence of intracranial metastases. 3. Moderate chronic small vessel ischemic disease.   02/17/2019 PET scan   1. Soft tissue mass within the vagina with central necrosis is again noted and is intensely FDG avid. 2. No specific findings to suggest nodal metastasis or distant metastatic disease. 3. There is a low-density subcutaneous soft tissue nodule within the right side of neck which is FDG avid. The CT appearance favors a sebaceous cyst. If inflamed/infected this may account for the increased uptake. Metastatic disease is less favored but not 100% excluded. Clinical correlation and biopsy if indicated. 4. Gallstones 5.  Aortic Atherosclerosis  (  ICD10-I70.0).     02/19/2019 Cancer Staging   Staging form: Vagina, AJCC 8th Edition - Clinical: Stage III (cT3, cN0, cM0) - Signed by Heath Lark, MD on 02/19/2019    06/13/2019 PET scan   1. Considerable improvement in size and activity of the vaginal mass, current maximum SUV 6.2, previously 14.8. No findings of adenopathy or metastatic disease at this time. 2. Resolution of the prior hypermetabolic subcutaneous lesion along the right posterior neck, which was probably inflammatory. 3. Other imaging findings of potential clinical significance: Aortic Atherosclerosis (ICD10-I70.0). Mild cardiomegaly. Cholelithiasis.   07/20/2020 Pathology Results   A. VAGINA, LEFT WALL, BIOPSY:  - Poorly differentiated malignant neoplasm, see comment.   COMMENT:   The morphology is similar to the patient's prior biopsy ([id number]). Additional studies can be performed upon request.    08/16/2020 -  Chemotherapy    Patient is on Treatment Plan: VULVAR CANCER CARBOPLATIN Q21D       11/05/2020 PET scan   1. Mild increase in size and activity of the left eccentric vaginal mass. 2. Mild increase in activity of a small sub-centimeter focus of hypermetabolic activity along the anterior gastric wall. This is of uncertain significance but could reflect a small gastric polyp, tumor, or a tumor implant along the gastric wall. 3. Other imaging findings of potential clinical significance: Aortic Atherosclerosis (ICD10-I70.0). Coronary atherosclerosis. Cholelithiasis. Prominent stool throughout the colon favors constipation. Lumbar spondylosis and degenerative disc disease.     REVIEW OF SYSTEMS:   Constitutional: Denies fevers, chills or abnormal weight loss Eyes: Denies blurriness of vision Ears, nose, mouth, throat, and face: Denies mucositis or sore throat Respiratory: Denies cough, dyspnea or wheezes Cardiovascular: Denies palpitation, chest discomfort or lower extremity swelling Gastrointestinal:   Denies nausea, heartburn or change in bowel habits Skin: Denies abnormal skin rashes Lymphatics: Denies new lymphadenopathy or easy bruising Neurological:Denies numbness, tingling or new weaknesses Behavioral/Psych: Mood is stable, no new changes  All other systems were reviewed with the patient and are negative.  I have reviewed the past medical history, past surgical history, social history and family history with the patient and they are unchanged from previous note.  ALLERGIES:  has No Known Allergies.  MEDICATIONS:  Current Outpatient Medications  Medication Sig Dispense Refill   Calcium Carbonate (CALCIUM 600 PO) Take 2 tablets by mouth daily.      diclofenac Sodium (VOLTAREN) 1 % GEL Apply 4 g topically 4 (four) times daily. 200 g 4   fluticasone (FLONASE) 50 MCG/ACT nasal spray Place 1 spray into both nostrils at bedtime as needed for allergies or rhinitis. 16 mL 3   hydrochlorothiazide (HYDRODIURIL) 25 MG tablet Take 1 tablet (25 mg total) by mouth daily. 90 tablet 2   Lidocaine 3 % CREA Apply 1 application topically 2 (two) times daily as needed. 85 g 2   Multiple Vitamins-Minerals (MULTIPLE VITAMINS/WOMENS PO) Take 1 tablet by mouth daily.      Omega-3 Fatty Acids (FISH OIL) 1200 MG CAPS Take 2,400 mg by mouth daily.     ondansetron (ZOFRAN) 8 MG tablet Take 1 tablet (8 mg total) by mouth every 8 (eight) hours as needed. 30 tablet 1   polyethylene glycol (MIRALAX / GLYCOLAX) 17 g packet Take 17 g by mouth daily.     prochlorperazine (COMPAZINE) 10 MG tablet Take 1 tablet (10 mg total) by mouth every 6 (six) hours as needed (Nausea or vomiting). 30 tablet 1   Trospium Chloride 60 MG CP24 TAKE 1 CAPSULE BY  MOUTH EVERY DAY 30 capsule 11   No current facility-administered medications for this visit.    PHYSICAL EXAMINATION: ECOG PERFORMANCE STATUS: 2 - Symptomatic, <50% confined to bed  Vitals:   11/08/20 1105  BP: (!) 130/47  Pulse: 70  Resp: 18  Temp: 97.8 F (36.6 C)   SpO2: 99%   Filed Weights   11/08/20 1105  Weight: 217 lb 3.2 oz (98.5 kg)    GENERAL:alert, no distress and comfortable HEART: She has moderate bilateral extremity edema NEURO: alert & oriented x 3 with fluent speech, no focal motor/sensory deficits  LABORATORY DATA:  I have reviewed the data as listed    Component Value Date/Time   NA 144 10/18/2020 1023   K 3.8 10/18/2020 1023   CL 108 10/18/2020 1023   CO2 27 10/18/2020 1023   GLUCOSE 103 (H) 10/18/2020 1023   BUN 18 10/18/2020 1023   CREATININE 0.82 10/18/2020 1023   CREATININE 0.88 02/05/2019 1052   CALCIUM 9.2 10/18/2020 1023   PROT 7.2 10/18/2020 1023   ALBUMIN 3.7 10/18/2020 1023   AST 17 10/18/2020 1023   ALT 12 10/18/2020 1023   ALKPHOS 56 10/18/2020 1023   BILITOT 0.6 10/18/2020 1023   GFRNONAA >60 10/18/2020 1023   GFRAA >60 09/11/2019 0950    No results found for: SPEP, UPEP  Lab Results  Component Value Date   WBC 4.0 10/18/2020   NEUTROABS 2.8 10/18/2020   HGB 11.6 (L) 10/18/2020   HCT 34.2 (L) 10/18/2020   MCV 95.8 10/18/2020   PLT 163 10/18/2020      Chemistry      Component Value Date/Time   NA 144 10/18/2020 1023   K 3.8 10/18/2020 1023   CL 108 10/18/2020 1023   CO2 27 10/18/2020 1023   BUN 18 10/18/2020 1023   CREATININE 0.82 10/18/2020 1023   CREATININE 0.88 02/05/2019 1052      Component Value Date/Time   CALCIUM 9.2 10/18/2020 1023   ALKPHOS 56 10/18/2020 1023   AST 17 10/18/2020 1023   ALT 12 10/18/2020 1023   BILITOT 0.6 10/18/2020 1023       RADIOGRAPHIC STUDIES: I have reviewed multiple imaging studies with the patient and daughter I have personally reviewed the radiological images as listed and agreed with the findings in the report. NM PET Image Restage (PS) Skull Base to Thigh  Result Date: 11/05/2020 CLINICAL DATA:  Subsequent treatment strategy for poorly differentiated vaginal malignancy. EXAM: NUCLEAR MEDICINE PET SKULL BASE TO THIGH TECHNIQUE: 10.8 mCi F-18  FDG was injected intravenously. Full-ring PET imaging was performed from the skull base to thigh after the radiotracer. CT data was obtained and used for attenuation correction and anatomic localization. Fasting blood glucose: 105 mg/dl COMPARISON:  Multiple exams, including PET-CT from 08/04/2020 FINDINGS: Mediastinal blood pool activity: SUV max 3.0 Liver activity: SUV max NA NECK: Stable symmetric activity along the posterior margin of the glottis/pharyngo esophageal junction, likely physiologic. Incidental CT findings: Mild bilateral common carotid atherosclerotic calcification. CHEST: No significant abnormal hypermetabolic activity in this region. Incidental CT findings: Coronary, aortic arch, and branch vessel atherosclerotic vascular disease. ABDOMEN/PELVIS: Small (sub-centimeter) nodule along the anterior gastric wall is somewhat blurred by motion on the CT data but demonstrates increased metabolic activity compared to previous, maximum SUV 8.3 (previously 4.6). Variable conspicuity on other prior exams including the 06/13/2019 exam. Given the persistence at this location, this seems unlikely to simply be physiologic activity and raises the possibility of a small polyp or  conceivably a tumor implant in the vicinity of the anterior gastric wall immediately adjacent to the border of the left hepatic lobe. Abnormal soft tissue fullness in the vicinity of the vagina persists with left eccentric vaginal activity with maximum SUV of 14.2, previously 15.8. The region of abnormal activity measures approximately 4.0 by 2.6 cm, formerly 3.5 by 2.4 cm. Incidental CT findings: Aortoiliac atherosclerotic vascular disease. Cholelithiasis. Prominent stool throughout the colon favors constipation. SKELETON: Activity along the left glenohumeral joint is attributed to benign arthropathy. Incidental CT findings: Lumbar spondylosis and degenerative disc disease. Thoracic kyphosis. Deformity from old sternal fracture.  IMPRESSION: 1. Mild increase in size and activity of the left eccentric vaginal mass. 2. Mild increase in activity of a small sub-centimeter focus of hypermetabolic activity along the anterior gastric wall. This is of uncertain significance but could reflect a small gastric polyp, tumor, or a tumor implant along the gastric wall. 3. Other imaging findings of potential clinical significance: Aortic Atherosclerosis (ICD10-I70.0). Coronary atherosclerosis. Cholelithiasis. Prominent stool throughout the colon favors constipation. Lumbar spondylosis and degenerative disc disease. Electronically Signed   By: Van Clines M.D.   On: 11/05/2020 12:08

## 2020-11-08 NOTE — Telephone Encounter (Signed)
Rx resent to correct pharmacy

## 2020-11-08 NOTE — Assessment & Plan Note (Signed)
I have reviewed multiple PET CT scan with the patient and her daughter It appears that she has disease progression while on treatment The patient is bleeding more clinically We discussed treatment options There are no clear benefit to prescribe pembrolizumab in her situation Her disease is rare and treatment options are limited I recommend referral back to GYN surgeon for evaluation to see if her disease can be cauterized or treated in other means We also discussed potential referral for palliative care with hospice service but the patient is interested to pursue more treatment

## 2020-11-09 ENCOUNTER — Telehealth: Payer: Self-pay | Admitting: *Deleted

## 2020-11-09 NOTE — Telephone Encounter (Signed)
Per Dr Alvy Bimler scheduled the patient for a follow up for bleeding. Per patient request scheduled appt though the daughter

## 2020-11-11 ENCOUNTER — Encounter: Payer: Self-pay | Admitting: Gynecologic Oncology

## 2020-11-12 ENCOUNTER — Other Ambulatory Visit: Payer: Self-pay

## 2020-11-12 ENCOUNTER — Encounter: Payer: Self-pay | Admitting: Oncology

## 2020-11-12 ENCOUNTER — Encounter: Payer: Self-pay | Admitting: Gynecologic Oncology

## 2020-11-12 ENCOUNTER — Inpatient Hospital Stay (HOSPITAL_BASED_OUTPATIENT_CLINIC_OR_DEPARTMENT_OTHER): Payer: Medicare Other | Admitting: Gynecologic Oncology

## 2020-11-12 VITALS — BP 128/61 | HR 94 | Temp 97.3°F | Resp 18 | Ht 70.0 in | Wt 210.0 lb

## 2020-11-12 DIAGNOSIS — C52 Malignant neoplasm of vagina: Secondary | ICD-10-CM

## 2020-11-12 DIAGNOSIS — I10 Essential (primary) hypertension: Secondary | ICD-10-CM | POA: Diagnosis not present

## 2020-11-12 DIAGNOSIS — E669 Obesity, unspecified: Secondary | ICD-10-CM | POA: Diagnosis not present

## 2020-11-12 DIAGNOSIS — Z9221 Personal history of antineoplastic chemotherapy: Secondary | ICD-10-CM | POA: Diagnosis not present

## 2020-11-12 DIAGNOSIS — M1990 Unspecified osteoarthritis, unspecified site: Secondary | ICD-10-CM | POA: Diagnosis not present

## 2020-11-12 DIAGNOSIS — Z923 Personal history of irradiation: Secondary | ICD-10-CM | POA: Diagnosis not present
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
## 2020-11-12 NOTE — Progress Notes (Signed)
Discussed hospice referral with Gisel and her daughter.  Provided them with information on [place].  They are going to review both with family and call back with their decision.

## 2020-11-20 NOTE — Progress Notes (Signed)
Radiation Oncology         [phone]) [phone] ________________________________  Name: Mia Simpson MRN: [id number]  Date: 11/22/2020  DOB: 1927-06-21  Follow-Up Visit Note  CC: [name], [name], MD  [name], [name], MD    ICD-10-CM   1. Vaginal cancer ([place])  C52       Diagnosis:  Clinical Stage III (cT3, cN0, cM0) poorly differentiated melanoma vs sarcoma of unclear primary  Interval Since Last Radiation:  1 year, 7 months, 1 week, 4 days    Intent: Curative  Radiation Treatment Dates: 03/10/2019 through 05/14/2019 Site Technique Total Dose (Gy) Dose per Fx (Gy) Completed Fx Beam Energies  Vagina: Pelvis_vagina HDR-brachy 24/24 6 4/4 Ir-192  Vagina: Pelvis IMRT 45/45 1.8 25/25 10X   Narrative:  The patient returns today for routine 6 month follow-up.  On the day of her last follow-up visit on 07/20/20, pathology results were received for left wall vaginal biopsy. Results from the biopsy revealed: poorly differentiated malignant neoplasm.    PET scan received on 08/04/20 showed interval progression of hypermetabolic activity to the left vagina; notably suspicious for disease progression. No evidence of metastatic disease to the neck, chest, abdomen, or pelvis were identified.   Soon after, the patient began chemotherapy on 08/16/20 (carboplatin Q21D).           Pertinent imaging thus far includes a PET scan taken on 11/05/20 which revealed a mild increase in the size and activity of the left eccentric vaginal mass. Also seen from the scan was a mild increase in activity of a small sub-centimeter focus of hypermetabolic activity along the anterior gastric wall. This was noted to be of uncertain significance, but could potentially reflect a tumor or small gastric polyp.                  The patient most recently met with Dr. Denman George on 11/12/20. During this visit, Dr. Denman George explained to the patient and her daughter that at this time, there are no interventions available and recommended for  initiating hospice care.     The patient and her family did call back earlier and have decided to proceed with hospice.  This will be set up in the near future.  Patient reports having vaginal bleeding discharge but no hemorrhaging at this time.  He reports using approximately 7-8 absorbent paper towels plus vaginal pads.  She reports this being more economical than using vaginal pads daily.  sHe denies any pain within the pelvis region.  She is not noticed any obvious blood in her urine or rectal bleeding.  Appetite remains good.         Allergies:  has No Known Allergies.  Meds: Current Outpatient Medications  Medication Sig Dispense Refill   Calcium Carbonate (CALCIUM 600 PO) Take 2 tablets by mouth daily.      diclofenac Sodium (VOLTAREN) 1 % GEL Apply 4 g topically 4 (four) times daily. 200 g 4   fluticasone (FLONASE) 50 MCG/ACT nasal spray Place 1 spray into both nostrils at bedtime as needed for allergies or rhinitis. 16 mL 3   hydrochlorothiazide (HYDRODIURIL) 25 MG tablet Take 1 tablet (25 mg total) by mouth daily. 90 tablet 2   Multiple Vitamins-Minerals (MULTIPLE VITAMINS/WOMENS PO) Take 1 tablet by mouth daily.      Omega-3 Fatty Acids (FISH OIL) 1200 MG CAPS Take 2,400 mg by mouth daily.     polyethylene glycol (MIRALAX / GLYCOLAX) 17 g packet Take 17 g by mouth  daily.     Trospium Chloride 60 MG CP24 TAKE 1 CAPSULE BY MOUTH EVERY DAY 30 capsule 11   Lidocaine 3 % CREA Apply 1 application topically 2 (two) times daily as needed. (Patient not taking: Reported on 11/22/2020) 85 g 2   ondansetron (ZOFRAN) 8 MG tablet Take 1 tablet (8 mg total) by mouth every 8 (eight) hours as needed. (Patient not taking: No sig reported) 30 tablet 1   prochlorperazine (COMPAZINE) 10 MG tablet Take 1 tablet (10 mg total) by mouth every 6 (six) hours as needed (Nausea or vomiting). (Patient not taking: No sig reported) 30 tablet 1   No current facility-administered medications for this encounter.     Physical Findings: The patient is in no acute distress. Patient is alert and oriented.  height is _0  (1.778 m) and weight is 215 lb 12.8 oz (97.9 kg). Her temperature is 97.8 F (36.6 C). Her blood pressure is 145/70 (abnormal) and her pulse is 97. Her respiration is 20 and oxygen saturation is 100%. .  No significant changes. Lungs are clear to auscultation bilaterally. Heart has regular rate and rhythm. No palpable cervical, supraclavicular, or axillary adenopathy. Abdomen soft, non-tender, normal bowel sounds.  Pelvic exam deferred in light of her recent exam by Dr. Denman George earlier this month.  This exam showed the left vaginal sidewall to be replaced by a fleshy necrotic tumor that was not actively bleeding.   Lab Findings: Lab Results  Component Value Date   WBC 4.0 10/18/2020   HGB 11.6 (L) 10/18/2020   HCT 34.2 (L) 10/18/2020   MCV 95.8 10/18/2020   PLT 163 10/18/2020    Radiographic Findings: NM PET Image Restage (PS) Skull Base to Thigh  Result Date: 11/05/2020 CLINICAL DATA:  Subsequent treatment strategy for poorly differentiated vaginal malignancy. EXAM: NUCLEAR MEDICINE PET SKULL BASE TO THIGH TECHNIQUE: 10.8 mCi F-18 FDG was injected intravenously. Full-ring PET imaging was performed from the skull base to thigh after the radiotracer. CT data was obtained and used for attenuation correction and anatomic localization. Fasting blood glucose: 105 mg/dl COMPARISON:  Multiple exams, including PET-CT from 08/04/2020 FINDINGS: Mediastinal blood pool activity: SUV max 3.0 Liver activity: SUV max NA NECK: Stable symmetric activity along the posterior margin of the glottis/pharyngo esophageal junction, likely physiologic. Incidental CT findings: Mild bilateral common carotid atherosclerotic calcification. CHEST: No significant abnormal hypermetabolic activity in this region. Incidental CT findings: Coronary, aortic arch, and branch vessel atherosclerotic vascular disease.  ABDOMEN/PELVIS: Small (sub-centimeter) nodule along the anterior gastric wall is somewhat blurred by motion on the CT data but demonstrates increased metabolic activity compared to previous, maximum SUV 8.3 (previously 4.6). Variable conspicuity on other prior exams including the 06/13/2019 exam. Given the persistence at this location, this seems unlikely to simply be physiologic activity and raises the possibility of a small polyp or conceivably a tumor implant in the vicinity of the anterior gastric wall immediately adjacent to the border of the left hepatic lobe. Abnormal soft tissue fullness in the vicinity of the vagina persists with left eccentric vaginal activity with maximum SUV of 14.2, previously 15.8. The region of abnormal activity measures approximately 4.0 by 2.6 cm, formerly 3.5 by 2.4 cm. Incidental CT findings: Aortoiliac atherosclerotic vascular disease. Cholelithiasis. Prominent stool throughout the colon favors constipation. SKELETON: Activity along the left glenohumeral joint is attributed to benign arthropathy. Incidental CT findings: Lumbar spondylosis and degenerative disc disease. Thoracic kyphosis. Deformity from old sternal fracture. IMPRESSION: 1. Mild increase in size and  activity of the left eccentric vaginal mass. 2. Mild increase in activity of a small sub-centimeter focus of hypermetabolic activity along the anterior gastric wall. This is of uncertain significance but could reflect a small gastric polyp, tumor, or a tumor implant along the gastric wall. 3. Other imaging findings of potential clinical significance: Aortic Atherosclerosis (ICD10-I70.0). Coronary atherosclerosis. Cholelithiasis. Prominent stool throughout the colon favors constipation. Lumbar spondylosis and degenerative disc disease. Electronically Signed   By: Van Clines M.D.   On: 11/05/2020 12:08    Impression:  Clinical Stage III (cT3, cN0, cM0) poorly differentiated melanoma vs sarcoma of unclear  primary Patient developed a recurrence after her definitive radiation therapy.  She treated with single agent carboplatinum but progressed somewhat on this regimen.  Patient is not a candidate for additional cytotoxics given her age and medical status.  We discussed that the patient has received full dose radiation therapy.  We would be unable to give a sufficient dose of radiation to control her area of recurrence.  We also discussed that the may consider possibly very limited course of external beam radiation therapy directed at the area of recurrence or possibly additional vaginal brachytherapy.  This may temporarily slow progression of disease.  At this time does not seem to be actively hemorrhaging.  After careful consideration the patient is comfortable with proceeding with hospice at this time.  Possibly consider palliative radiation therapy in the future if she is bleeding significantly.  Plan: As needed follow-up in radiation oncology   15 minutes of total time was spent for this patient encounter, including preparation, face-to-face counseling with the patient and coordination of care, physical exam, and documentation of the encounter. ____________________________________  Blair Promise, PhD, MD   This document serves as a record of services personally performed by Gery Pray, MD. It was created on his behalf by Roney Mans, a trained medical scribe. The creation of this record is based on the scribe's personal observations and the provider's statements to them. This document has been checked and approved by the attending provider.

## 2020-11-22 ENCOUNTER — Other Ambulatory Visit: Payer: Self-pay

## 2020-11-22 ENCOUNTER — Ambulatory Visit
Admission: RE | Admit: 2020-11-22 | Discharge: 2020-11-22 | Disposition: A | Discharge status: Home / Self Care | Payer: Medicare Other | Source: Ambulatory Visit | Attending: Radiation Oncology | Admitting: Radiation Oncology

## 2020-11-22 ENCOUNTER — Encounter: Payer: Self-pay | Admitting: Radiation Oncology

## 2020-11-22 ENCOUNTER — Telehealth: Payer: Self-pay | Admitting: Oncology

## 2020-11-22 VITALS — BP 145/70 | HR 97 | Temp 97.8°F | Resp 20 | Ht 70.0 in | Wt 215.8 lb

## 2020-11-22 DIAGNOSIS — K8020 Calculus of gallbladder without cholecystitis without obstruction: Secondary | ICD-10-CM | POA: Insufficient documentation

## 2020-11-22 DIAGNOSIS — C52 Malignant neoplasm of vagina: Secondary | ICD-10-CM

## 2020-11-22 DIAGNOSIS — Z79899 Other long term (current) drug therapy: Secondary | ICD-10-CM | POA: Insufficient documentation

## 2020-11-22 DIAGNOSIS — M47816 Spondylosis without myelopathy or radiculopathy, lumbar region: Secondary | ICD-10-CM | POA: Insufficient documentation

## 2020-11-22 DIAGNOSIS — Z08 Encounter for follow-up examination after completed treatment for malignant neoplasm: Secondary | ICD-10-CM | POA: Diagnosis not present

## 2020-11-22 NOTE — Progress Notes (Signed)
Mia Simpson is here today for follow up post radiation to the pelvic.  They completed their radiation on: 05/14/2019   Does the patient complain of any of the following:  Pain:Intermittent left knee pain relieved by diclofenac cream Abdominal bloating: denies Diarrhea/Constipation: denies Nausea/Vomiting: denies Vaginal Discharge: vaginal bleeding Blood in Urine or Stool: denies Urinary Issues (dysuria/incomplete emptying/ incontinence/ increased frequency/urgency): nocturia x 2-3 Does patient report using vaginal dilator 2-3 times a week and/or sexually active 2-3 weeks: no, Dr Denman George advised patient to stop    Additional comments if applicable: none  Vitals:   11/22/20 1534  BP: (!) 145/70  Pulse: 97  Resp: 20  Temp: 97.8 F (36.6 C)  SpO2: 100%  Weight: 215 lb 12.8 oz (97.9 kg)  Height: 5\' 10"  (1.778 m)

## 2020-11-22 NOTE — Telephone Encounter (Signed)
[name] (daughter) called and said they would like to set uo hospice.  She will call tomorrow to let us know which hospice they would prefer.

## 2020-11-23 ENCOUNTER — Telehealth: Payer: Self-pay | Admitting: Family Medicine

## 2020-11-23 ENCOUNTER — Encounter: Payer: Self-pay | Admitting: Hematology and Oncology

## 2020-11-23 NOTE — Telephone Encounter (Signed)
Verbal given to [employer]

## 2020-11-23 NOTE — Telephone Encounter (Signed)
[name] and Taesha called back and have decided on [facility].  They are also wondering if hospice provides a home health adie as well.  Advised them that I will find out and let them know.  Called in referral to [place] at [facility].  She said the home health aide would need to be separate.  They will reach out to [facility] this afternoon or tomorrow.  Called Rosemary back and notified her about the home health aide.  She verbalized understanding and agreement.

## 2020-11-23 NOTE — Telephone Encounter (Signed)
Alwyn Ren from [facility] called to see if Dr. [name] will remain the patients PCP and serve as attending while the patient is in Hospice.  [place] [phone]

## 2020-11-25 ENCOUNTER — Telehealth: Payer: Self-pay | Admitting: Family Medicine

## 2020-11-25 ENCOUNTER — Ambulatory Visit: Payer: Self-pay | Admitting: Radiation Oncology

## 2020-11-25 NOTE — Telephone Encounter (Signed)
Marzetta Board is calling in to see if it is okay for the pt to be switched to palliative care due to the family it not ready for hospice.

## 2020-11-30 NOTE — Telephone Encounter (Signed)
Verbal given to Stacy. 

## 2020-12-01 ENCOUNTER — Telehealth: Payer: Self-pay

## 2020-12-01 NOTE — Telephone Encounter (Signed)
Spoke with patient's DIL Rosemary and scheduled an in-person Palliative Consult for 12/16/20 @ 9AM  COVID screening was negative. No pets in home. Patient lives alone.  Consent obtained; updated Outlook/Netsmart/Team List and Epic.   Family is aware they may be receiving a call from NP the day before or day of to confirm appointment.

## 2020-12-16 ENCOUNTER — Other Ambulatory Visit: Payer: Self-pay

## 2020-12-16 ENCOUNTER — Other Ambulatory Visit: Payer: Medicare Other | Admitting: Hospice

## 2020-12-16 DIAGNOSIS — C52 Malignant neoplasm of vagina: Secondary | ICD-10-CM

## 2020-12-16 DIAGNOSIS — M25562 Pain in left knee: Secondary | ICD-10-CM | POA: Diagnosis not present

## 2020-12-16 DIAGNOSIS — I739 Peripheral vascular disease, unspecified: Secondary | ICD-10-CM | POA: Diagnosis not present

## 2020-12-16 DIAGNOSIS — Z515 Encounter for palliative care: Secondary | ICD-10-CM | POA: Diagnosis not present

## 2020-12-16 DIAGNOSIS — G8929 Other chronic pain: Secondary | ICD-10-CM | POA: Diagnosis not present

## 2020-12-16 NOTE — Progress Notes (Addendum)
[facility] Consult Note Telephone: [phone]  Fax: [phone]  PATIENT NAME: Mia [place] [address] [phone] (home)  DOB: 1928-03-05 MRN: [id number]  PRIMARY CARE PROVIDER:    [name], [name], MD,  [place] [address] [phone]  REFERRING PROVIDER:   [name], [name], [place],  [place] [address] [phone]  RESPONSIBLE PARTY:   Self [phone] Emergency contact: [name] - daughter in law.  Contact Information     Name Relation Home Work [place] Daughter   [phone]   [name] [phone]     [name]   [phone]       I met face to face with patient and family at home. Palliative Care was asked to follow this patient by consultation request of  [name], Malka So, MD to address advance care planning, complex medical decision making and goals of care clarification. [name] is with patient during visit. This is the initial visit.    ASSESSMENT AND / RECOMMENDATIONS:   Advance Care Planning: Our advance care planning conversation included a discussion about:    The value and importance of advance care planning  Difference between Hospice and Palliative care Exploration of goals of care in the event of a sudden injury or illness  Identification and preparation of a healthcare agent  Review and updating or creation of an  advance directive document .  CODE STATUS:Extensive discussion on ramifications and implications of code status. Patient affirms she is a Full code.   Goals of Care: Goals include to maximize quality of life and symptom management  I spent 25 minutes providing this initial consultation. More than 50% of the time in this consultation was spent on counseling patient and coordinating  communication. --------------------------------------------------------------------------------------------------------------------------------------  Symptom Management/Plan: Vaginal CA: No more chemo/radiation at this time. Occasional bleeding on exertion. Continue to monitor.   Left knee pain: Injection at Ortho as scheduled.  Constipation: Managed with diet mostly of fruits and vegetables. Miralax on hand.  Follow up: Palliative care will continue to follow for complex medical decision making, advance care planning, and clarification of goals. Return 6 weeks or prn.Encouraged to call provider sooner with any concerns.   Family /Caregiver/Community Supports: Patient lives at home independently; her son and daughter in law [name] live close by and visit often.   HOSPICE ELIGIBILITY/DIAGNOSIS: TBD  Chief Complaint: Initial Palliative care visit  HISTORY OF PRESENT ILLNESS:  Joe Tanney is a 87 y.o. year old female  with multiple medical conditions including  vaginal cancer, completed chemo and radiation. The CA has come back and no plans for chemo and radiation at this time.  Vaginal CA causes her vaginal bleeding worsened when she lifts heavy weight; bleeding is controlled by avoiding lifting heavy things and getting rest. Patient denies pain/discomfort. History of HTN, Left knee pain, PAD, constipation.  History obtained from review of EMR, discussion with primary team, caregiver, family and/or Ms. Scow.  Review and summarization of Epic records shows history from other than patient. Rest of 10 point ROS asked and negative.     Review of lab tests/diagnostics   Results for CHEYENNA, PANKOWSKI (MRN [id number]) as of 12/16/2020 09:30  Ref. Range 10/18/2020 10:23  WBC Latest Ref Range: 4.0 - 10.5 K/uL 4.0  RBC Latest Ref Range: 3.87 - 5.11 MIL/uL 3.57 (L)  Hemoglobin Latest Ref Range: 12.0 - 15.0 g/dL 11.6 (L)  HCT Latest Ref  Range: 36.0 - 46.0 % 34.2 (L)  MCV Latest Ref Range: 80.0 - 100.0 fL  95.8  MCH Latest Ref Range: 26.0 - 34.0 pg 32.5  MCHC Latest Ref Range: 30.0 - 36.0 g/dL 33.9  RDW Latest Ref Range: 11.5 - 15.5 % 16.3 (H)  Platelets Latest Ref Range: 150 - 400 K/uL 163    ROS General: NAD EYES: denies vision changes, wears eyeglasses ENMT: denies dysphagia Cardiovascular: denies chest pain/discomfort Pulmonary: denies cough, denies SOB Abdomen: endorses good appetite, denies constipation/diarrhea GU: denies dysuria, urinary frequency MSK:  endorses weakness,  no falls reported Skin: denies rashes or wounds Neurological: denies pain, denies insomnia Psych: Endorses positive mood Heme/lymph/immuno: denies bruises, abnormal bleeding  Physical Exam: Height/Weight: 5 feet 10 inches/ 219 Ibs Constitutional: NAD General: Well groomed, cooperative EYES: anicteric sclera, lids intact, no discharge  ENMT: Moist mucous membrane CV: S1 S2, RRR, no LE edema Pulmonary: LCTA, no increased work of breathing, no cough, Abdomen: active BS + 4 quadrants, soft and non tender GU: no suprapubic tenderness MSK: weakness, ambulatory with assistive device - cane Skin: warm and dry, no rashes or wounds on visible skin Neuro:  weakness, otherwise non focal Psych: non-anxious affect Hem/lymph/immuno: no widespread bruising   PAST MEDICAL HISTORY:  Active Ambulatory Problems    Diagnosis Date Noted   Essential (primary) hypertension 05/01/2016   Preoperative clearance 05/01/2016   DJD (degenerative joint disease) of knee 05/01/2016   S/P TKR (total knee replacement) using cement, right 06/16/2016   Sternal fracture 12/31/2016   Conductive hearing loss, bilateral 06/06/2018   Bilateral impacted cerumen 10/10/2016   Bilateral lower extremity edema 11/04/2018   Class 1 obesity with serious comorbidity and body mass index (BMI) of 34.0 to 34.9 in adult 11/04/2018   Vitamin D deficiency, unspecified 11/04/2018   Vaginal mass 02/10/2019   Vaginal cancer ([place]) 02/10/2019    History of colonic polyps 02/10/2019   History of partial colectomy 02/10/2019   Obesity (BMI 30.0-34.9) 02/10/2019   Meningioma ([facility]) 02/19/2019   Shoulder, capsulitis, adhesive 05/06/2019   Goals of care, counseling/discussion 06/18/2019   Pancytopenia, acquired ([facility]) 09/16/2019   Weight loss, abnormal 09/06/2020   Other constipation 09/06/2020   Varicose veins of bilateral lower extremities with other complications 09/81/1914   Resolved Ambulatory Problems    Diagnosis Date Noted   Vaginal bleeding, abnormal 02/19/2019   Elevated LFTs 09/06/2020   PAD (peripheral artery disease) ([place] [facility]) 11/03/2020   Past Medical History:  Diagnosis Date   Adenomatous colon polyp 2012   Arthritis    Benign essential HTN 05/01/2016   history of radiation therapy 03/10/2019-04/14/2019   History of radiation therapy 05/14/2019   Hypertension    Obesity    Osteoarthritis    Senile purpura ([place])     SOCIAL HX:  Social History   Tobacco Use   Smoking status: Former   Smokeless tobacco: Never  Substance Use Topics   Alcohol use: No     FAMILY HX:  Family History  Problem Relation Age of Onset   Gout Father    Early death Father    Stroke Father    Diabetes Sister    Cancer Sister    Breast cancer Maternal Aunt    Prostate cancer Son    Birth defects Son    Heart disease Mother    COPD Brother    Diabetes Daughter    Heart attack Sister    Heart attack Brother       ALLERGIES: No  Known Allergies    PERTINENT MEDICATIONS:  Outpatient Encounter Medications as of 12/16/2020  Medication Sig   Calcium Carbonate (CALCIUM 600 PO) Take 2 tablets by mouth daily.    diclofenac Sodium (VOLTAREN) 1 % GEL Apply 4 g topically 4 (four) times daily.   fluticasone (FLONASE) 50 MCG/ACT nasal spray Place 1 spray into both nostrils at bedtime as needed for allergies or rhinitis.   hydrochlorothiazide (HYDRODIURIL) 25 MG tablet Take 1 tablet (25 mg total) by mouth daily.   Lidocaine 3 % CREA  Apply 1 application topically 2 (two) times daily as needed. (Patient not taking: Reported on 11/22/2020)   Multiple Vitamins-Minerals (MULTIPLE VITAMINS/WOMENS PO) Take 1 tablet by mouth daily.    Omega-3 Fatty Acids (FISH OIL) 1200 MG CAPS Take 2,400 mg by mouth daily.   ondansetron (ZOFRAN) 8 MG tablet Take 1 tablet (8 mg total) by mouth every 8 (eight) hours as needed. (Patient not taking: No sig reported)   polyethylene glycol (MIRALAX / GLYCOLAX) 17 g packet Take 17 g by mouth daily.   prochlorperazine (COMPAZINE) 10 MG tablet Take 1 tablet (10 mg total) by mouth every 6 (six) hours as needed (Nausea or vomiting). (Patient not taking: No sig reported)   Trospium Chloride 60 MG CP24 TAKE 1 CAPSULE BY MOUTH EVERY DAY   No facility-administered encounter medications on file as of 12/16/2020.    Thank you for the opportunity to participate in the care of Ms. Brisby.  The palliative care team will continue to follow. Please call our office at [phone] if we can be of additional assistance.   Note: Portions of this note were generated with Lobbyist. Dictation errors may occur despite best attempts at proofreading.  Teodoro Spray, NP

## 2021-01-06 ENCOUNTER — Other Ambulatory Visit: Payer: Medicare Other | Admitting: Hospice

## 2021-01-06 ENCOUNTER — Other Ambulatory Visit: Payer: Self-pay

## 2021-01-06 DIAGNOSIS — Z515 Encounter for palliative care: Secondary | ICD-10-CM

## 2021-01-06 DIAGNOSIS — C52 Malignant neoplasm of vagina: Secondary | ICD-10-CM

## 2021-01-06 DIAGNOSIS — G8929 Other chronic pain: Secondary | ICD-10-CM

## 2021-01-06 DIAGNOSIS — M25562 Pain in left knee: Secondary | ICD-10-CM | POA: Diagnosis not present
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
## 2021-01-18 ENCOUNTER — Other Ambulatory Visit: Payer: Self-pay

## 2021-01-18 ENCOUNTER — Other Ambulatory Visit: Payer: Medicare Other | Admitting: Hospice

## 2021-01-18 DIAGNOSIS — M25562 Pain in left knee: Secondary | ICD-10-CM

## 2021-01-18 DIAGNOSIS — W19XXXD Unspecified fall, subsequent encounter: Secondary | ICD-10-CM | POA: Diagnosis not present

## 2021-01-18 DIAGNOSIS — G8929 Other chronic pain: Secondary | ICD-10-CM

## 2021-01-18 DIAGNOSIS — Z515 Encounter for palliative care: Secondary | ICD-10-CM | POA: Diagnosis not present
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
## 2021-02-10 DIAGNOSIS — M1712 Unilateral primary osteoarthritis, left knee: Secondary | ICD-10-CM | POA: Diagnosis not present

## 2021-02-17 DIAGNOSIS — M1712 Unilateral primary osteoarthritis, left knee: Secondary | ICD-10-CM | POA: Diagnosis not present

## 2021-02-18 DIAGNOSIS — R8271 Bacteriuria: Secondary | ICD-10-CM | POA: Diagnosis not present

## 2021-02-18 DIAGNOSIS — R3915 Urgency of urination: Secondary | ICD-10-CM | POA: Diagnosis not present

## 2021-02-24 DIAGNOSIS — M1712 Unilateral primary osteoarthritis, left knee: Secondary | ICD-10-CM | POA: Diagnosis not present

## 2021-02-28 ENCOUNTER — Telehealth: Payer: Self-pay | Admitting: Family Medicine

## 2021-02-28 ENCOUNTER — Other Ambulatory Visit: Payer: Self-pay

## 2021-02-28 NOTE — Telephone Encounter (Signed)
Fyi - medication added to med list.

## 2021-02-28 NOTE — Telephone Encounter (Signed)
Dr French Ana gave her a prescription for Diclofenac to take for her knee--50 mg.  It took the pain away, however it made her feel funny, kinda light-headed.  She said he told her to let Dr. [name] know that she is taking the medicine.  She states she just wants the medication notated in her chart and let Dr. [name] know know what she is taking.  She doesn't need a call back.

## 2021-03-02 NOTE — Telephone Encounter (Signed)
I called and spoke with patient. She had already discussed with Dr. French Ana about the medication on Monday. He directed for patient to take the medication as needed, but she can take tylenol for pain if needed. Patient hasn't had any more lightheadedness. Per patient, she is "feeling great today" and has only had to take tylenol this morning. She will let us know if the lightheadedness comes back.

## 2021-03-02 NOTE — Telephone Encounter (Signed)
Her last renal function was in normal range. She needs to be careful with BP, continue monitoring it while taking medication. If she continues having lightheadedness after taking medication, she may need to discontinue it. Thanks, BJ

## 2021-03-03 ENCOUNTER — Other Ambulatory Visit: Payer: Self-pay

## 2021-03-03 ENCOUNTER — Other Ambulatory Visit: Payer: Medicare Other | Admitting: Hospice

## 2021-03-03 DIAGNOSIS — W19XXXD Unspecified fall, subsequent encounter: Secondary | ICD-10-CM | POA: Diagnosis not present

## 2021-03-03 DIAGNOSIS — Z515 Encounter for palliative care: Secondary | ICD-10-CM

## 2021-03-03 DIAGNOSIS — K5901 Slow transit constipation: Secondary | ICD-10-CM | POA: Diagnosis not present

## 2021-03-03 DIAGNOSIS — C52 Malignant neoplasm of vagina: Secondary | ICD-10-CM | POA: Diagnosis not present
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
## 2021-03-15 DIAGNOSIS — Z23 Encounter for immunization: Secondary | ICD-10-CM | POA: Diagnosis not present

## 2021-05-06 ENCOUNTER — Encounter: Payer: Self-pay | Admitting: Family Medicine

## 2021-05-06 ENCOUNTER — Ambulatory Visit (INDEPENDENT_AMBULATORY_CARE_PROVIDER_SITE_OTHER): Payer: Medicare Other | Admitting: Family Medicine

## 2021-05-06 VITALS — BP 126/80 | HR 108 | Resp 16 | Ht 70.0 in

## 2021-05-06 DIAGNOSIS — C52 Malignant neoplasm of vagina: Secondary | ICD-10-CM | POA: Diagnosis not present

## 2021-05-06 DIAGNOSIS — I10 Essential (primary) hypertension: Secondary | ICD-10-CM

## 2021-05-06 DIAGNOSIS — I700 Atherosclerosis of aorta: Secondary | ICD-10-CM

## 2021-05-06 DIAGNOSIS — M1712 Unilateral primary osteoarthritis, left knee: Secondary | ICD-10-CM

## 2021-05-06 MED ORDER — DICLOFENAC SODIUM 25 MG PO TBEC: 25.0000 mg | DELAYED_RELEASE_TABLET | Freq: Two times a day (BID) | ORAL | 1 refills | Status: DC | PRN

## 2021-05-06 NOTE — Assessment & Plan Note (Addendum)
We discussed imaging report. She is not on statin medication and given her chronic medical problem I do not think she will benefit from pharmacologic treatment.

## 2021-05-06 NOTE — Patient Instructions (Addendum)
A few things to remember from today's visit:  Osteoarthritis of left knee, unspecified osteoarthritis type - Plan: diclofenac (VOLTAREN) 25 MG EC tablet  Essential (primary) hypertension  If you need refills please call your pharmacy. Do not use My Chart to request refills or for acute issues that need immediate attention.   We can try lower dose of Diclofenac. You can take Tylenol 500 mg 3-4 times per day.  Monitor blood pressure. Adequate hydration. Please let me know if medication is helping.  Fall precautions. A knee brace may help with stability.  Please be sure medication list is accurate. If a new problem present, please set up appointment sooner than planned today.

## 2021-05-06 NOTE — Assessment & Plan Note (Signed)
We discussed Dx and prognosis. Intra-articular injections did not help and she is not a good candidate for surgical treatment. Diclofenac helped but not well tolerated. She agrees with trying loser dose, 25 mg bid prn. We discussed side effects of NSAID's. She can also take Tylenol 500 mg 3-4 tabs daily. Fall precautions discussed.
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
## 2021-05-06 NOTE — Assessment & Plan Note (Signed)
BP adequately controlled. Continue HCTZ 25 mg daily. Some side effects of NSAID's discussed. Monitor BP at home.

## 2021-05-10 ENCOUNTER — Telehealth: Payer: Self-pay

## 2021-05-10 NOTE — Telephone Encounter (Signed)
fyi

## 2021-05-10 NOTE — Telephone Encounter (Signed)
Patient called stating that she has not received Rx prescribed yet and wants Dr. [name] to know that and she will take the Rx as soon as she receives it.

## 2021-05-12 DIAGNOSIS — M1712 Unilateral primary osteoarthritis, left knee: Secondary | ICD-10-CM | POA: Diagnosis not present

## 2021-05-16 NOTE — Telephone Encounter (Signed)
Patient called in to give an update on her knee with the diclofenac. The medication is working well and pt hasn't had to take any tylenol. Pt is aware that she can continue to take the diclofenac as long as it is helping and at the lower dose as prescribed. She also received her knee brace. She will let us know if anything changes.

## 2021-06-06 ENCOUNTER — Other Ambulatory Visit: Payer: Self-pay | Admitting: Family Medicine

## 2021-06-06 DIAGNOSIS — M1712 Unilateral primary osteoarthritis, left knee: Secondary | ICD-10-CM

## 2021-06-09 ENCOUNTER — Other Ambulatory Visit: Payer: Medicare Other | Admitting: Hospice

## 2021-06-09 ENCOUNTER — Other Ambulatory Visit: Payer: Self-pay

## 2021-06-09 DIAGNOSIS — Z515 Encounter for palliative care: Secondary | ICD-10-CM | POA: Diagnosis not present

## 2021-06-09 DIAGNOSIS — W19XXXD Unspecified fall, subsequent encounter: Secondary | ICD-10-CM

## 2021-06-09 DIAGNOSIS — C52 Malignant neoplasm of vagina: Secondary | ICD-10-CM

## 2021-06-09 DIAGNOSIS — M25562 Pain in left knee: Secondary | ICD-10-CM | POA: Diagnosis not present

## 2021-06-09 DIAGNOSIS — G8929 Other chronic pain: Secondary | ICD-10-CM | POA: Diagnosis not present
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
## 2021-07-11 ENCOUNTER — Telehealth: Payer: Self-pay | Admitting: Family Medicine

## 2021-07-11 MED ORDER — HYDROCHLOROTHIAZIDE 25 MG PO TABS: 25.0000 mg | ORAL_TABLET | Freq: Every day | ORAL | 2 refills | Status: DC

## 2021-07-11 NOTE — Telephone Encounter (Signed)
Pt call and stated she want all medications sent to  [facility] [place], [place] - [address] Phone:  [phone]  Fax:  [phone]

## 2021-07-11 NOTE — Telephone Encounter (Signed)
Rx sent into mail order, other rx's were short term.

## 2021-07-18 ENCOUNTER — Telehealth: Payer: Self-pay | Admitting: Family Medicine

## 2021-07-18 ENCOUNTER — Other Ambulatory Visit: Payer: Self-pay | Admitting: Family Medicine

## 2021-07-18 DIAGNOSIS — M1712 Unilateral primary osteoarthritis, left knee: Secondary | ICD-10-CM

## 2021-07-18 NOTE — Telephone Encounter (Signed)
Okay to send in 10 capsules?

## 2021-07-18 NOTE — Telephone Encounter (Signed)
I spoke with [facility]. They sent a 30 day Rx in for patient.

## 2021-07-18 NOTE — Telephone Encounter (Signed)
Pt is calling and would like to know if dr [name] would call in #10 Trospium Chloride 60 MG CP24  . Pt is waiting on mailorder and she called the md on Friday who prescribed the medication and he was out of office Friday. Please send to  [id number] - [place], [place] Phone:  [phone]  Fax:  [phone]

## 2021-07-19 ENCOUNTER — Telehealth: Payer: Self-pay | Admitting: *Deleted

## 2021-07-19 NOTE — Telephone Encounter (Signed)
Received call from Wandra Feinstein today. She wanted her mother to get a PET scan and asked if her mother could have an office visit with our provider to talk to her about her prognosis of care and possible hospice care if needed. Provider advised that a PET scan would not be covered and for pt to follow up with her palliative team. Ms. Joya Gaskins was agreeable, verbalized understanding, and stated that she will call her mother's palliative team.

## 2021-07-28 ENCOUNTER — Other Ambulatory Visit: Payer: Medicare Other | Admitting: Hospice

## 2021-07-28 ENCOUNTER — Other Ambulatory Visit: Payer: Self-pay

## 2021-07-28 DIAGNOSIS — Z515 Encounter for palliative care: Secondary | ICD-10-CM | POA: Diagnosis not present

## 2021-07-28 DIAGNOSIS — K5901 Slow transit constipation: Secondary | ICD-10-CM | POA: Diagnosis not present

## 2021-07-28 DIAGNOSIS — C52 Malignant neoplasm of vagina: Secondary | ICD-10-CM

## 2021-07-28 DIAGNOSIS — M25562 Pain in left knee: Secondary | ICD-10-CM | POA: Diagnosis not present

## 2021-07-28 DIAGNOSIS — G8929 Other chronic pain: Secondary | ICD-10-CM | POA: Diagnosis not present
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
## 2021-08-02 ENCOUNTER — Encounter: Payer: Self-pay | Admitting: Family Medicine

## 2021-08-02 ENCOUNTER — Ambulatory Visit (INDEPENDENT_AMBULATORY_CARE_PROVIDER_SITE_OTHER): Payer: Medicare Other | Admitting: Family Medicine

## 2021-08-02 VITALS — BP 126/60 | HR 100 | Resp 16 | Ht 70.0 in | Wt 199.2 lb

## 2021-08-02 DIAGNOSIS — N898 Other specified noninflammatory disorders of vagina: Secondary | ICD-10-CM | POA: Diagnosis not present

## 2021-08-02 DIAGNOSIS — I700 Atherosclerosis of aorta: Secondary | ICD-10-CM

## 2021-08-02 DIAGNOSIS — C52 Malignant neoplasm of vagina: Secondary | ICD-10-CM

## 2021-08-02 DIAGNOSIS — M1712 Unilateral primary osteoarthritis, left knee: Secondary | ICD-10-CM

## 2021-08-02 NOTE — Patient Instructions (Addendum)
A few things to remember from today's visit: ? ?Vaginal cancer ([place]) ? ?Vaginal discharge ? ?Osteoarthritis of left knee, unspecified osteoarthritis type ? ?If you need refills please call your pharmacy. ?Do not use My Chart to request refills or for acute issues that need immediate attention. ?  ?Diclofenac 25 mg 1 or 2 times daily as needed for pain. ?Vaginal duchess with vinager Wednesdays and Saturdays may help. ?I will send a message to gyn and ask if there is something else can be done to help. ? ?Please be sure medication list is accurate. ?If a new problem present, please set up appointment sooner than planned today. ? ? ? ? ? ? ? ?

## 2021-09-02 ENCOUNTER — Other Ambulatory Visit: Payer: Self-pay | Admitting: Family Medicine

## 2021-09-05 DIAGNOSIS — M1712 Unilateral primary osteoarthritis, left knee: Secondary | ICD-10-CM | POA: Diagnosis not present

## 2021-09-09 ENCOUNTER — Other Ambulatory Visit: Payer: Self-pay | Admitting: Family Medicine

## 2021-09-09 DIAGNOSIS — M1712 Unilateral primary osteoarthritis, left knee: Secondary | ICD-10-CM

## 2021-09-12 ENCOUNTER — Telehealth: Payer: Self-pay

## 2021-09-12 DIAGNOSIS — M1711 Unilateral primary osteoarthritis, right knee: Secondary | ICD-10-CM | POA: Diagnosis not present

## 2021-09-12 NOTE — Telephone Encounter (Signed)
145 pm.  Incoming call from patient.  She stated there is an appointment with Chanda Busing, NP on April 20 and she is uncertain if she can make it.  Patient states her family transports her to appointments and she was not aware of this upcoming appt.  I advised NP will either to a home visit or telephonic visit with her.  She will speak with her family to see if someone an be present for the visit on this day.  If no one is available, patient will call back to reschedule.  ?

## 2021-09-15 ENCOUNTER — Other Ambulatory Visit: Payer: Medicare Other | Admitting: Hospice

## 2021-09-15 DIAGNOSIS — Z515 Encounter for palliative care: Secondary | ICD-10-CM | POA: Diagnosis not present

## 2021-09-15 DIAGNOSIS — C52 Malignant neoplasm of vagina: Secondary | ICD-10-CM | POA: Diagnosis not present

## 2021-09-15 DIAGNOSIS — M25562 Pain in left knee: Secondary | ICD-10-CM | POA: Diagnosis not present

## 2021-09-15 DIAGNOSIS — K5901 Slow transit constipation: Secondary | ICD-10-CM

## 2021-09-15 DIAGNOSIS — R269 Unspecified abnormalities of gait and mobility: Secondary | ICD-10-CM

## 2021-09-15 DIAGNOSIS — G8929 Other chronic pain: Secondary | ICD-10-CM
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
## 2021-09-19 DIAGNOSIS — M1712 Unilateral primary osteoarthritis, left knee: Secondary | ICD-10-CM | POA: Diagnosis not present

## 2021-10-07 ENCOUNTER — Other Ambulatory Visit: Payer: Self-pay | Admitting: Family Medicine

## 2021-10-07 DIAGNOSIS — M1712 Unilateral primary osteoarthritis, left knee: Secondary | ICD-10-CM

## 2021-10-07 NOTE — Telephone Encounter (Signed)
She is on the diclofenac 25 mg orally, does she need this too? ?

## 2021-11-10 ENCOUNTER — Other Ambulatory Visit: Payer: Self-pay | Admitting: Family Medicine

## 2021-11-10 DIAGNOSIS — M1712 Unilateral primary osteoarthritis, left knee: Secondary | ICD-10-CM

## 2021-12-15 ENCOUNTER — Other Ambulatory Visit: Payer: Medicare Other | Admitting: Hospice

## 2021-12-15 DIAGNOSIS — M25562 Pain in left knee: Secondary | ICD-10-CM | POA: Diagnosis not present

## 2021-12-15 DIAGNOSIS — G8929 Other chronic pain: Secondary | ICD-10-CM

## 2021-12-15 DIAGNOSIS — K5901 Slow transit constipation: Secondary | ICD-10-CM | POA: Diagnosis not present

## 2021-12-15 DIAGNOSIS — Z515 Encounter for palliative care: Secondary | ICD-10-CM

## 2021-12-15 DIAGNOSIS — C52 Malignant neoplasm of vagina: Secondary | ICD-10-CM | POA: Diagnosis not present

## 2021-12-15 DIAGNOSIS — R269 Unspecified abnormalities of gait and mobility: Secondary | ICD-10-CM | POA: Diagnosis not present
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
## 2021-12-19 ENCOUNTER — Other Ambulatory Visit: Payer: Medicare Other | Admitting: Hospice

## 2021-12-19 ENCOUNTER — Other Ambulatory Visit: Payer: Medicare Other

## 2021-12-19 DIAGNOSIS — C52 Malignant neoplasm of vagina: Secondary | ICD-10-CM | POA: Diagnosis not present

## 2021-12-19 DIAGNOSIS — M25562 Pain in left knee: Secondary | ICD-10-CM | POA: Diagnosis not present

## 2021-12-19 DIAGNOSIS — R269 Unspecified abnormalities of gait and mobility: Secondary | ICD-10-CM

## 2021-12-19 DIAGNOSIS — G8929 Other chronic pain: Secondary | ICD-10-CM | POA: Diagnosis not present

## 2021-12-19 DIAGNOSIS — Z515 Encounter for palliative care: Secondary | ICD-10-CM

## 2021-12-19 NOTE — Progress Notes (Signed)
COMMUNITY PALLIATIVE CARE SW NOTE  PATIENT NAME: Mia Simpson DOB: Sep 02, 1927 MRN: [id number]  PRIMARY CARE PROVIDER: [name], [name], MD  RESPONSIBLE PARTY:  Acct ID - Guarantor Home Phone Work Phone Relationship Acct Type  [id number] - Mia Simpson [phone]  Self P/F     [address], [place], [place] [address]   SOCIAL WORK ENCOUNTER  SW completed a joint visit with NP-L. Esh[place] to provide education, support and resources to patient. Patient was present with her [name] and son-in-law. She is hard of hearing, but engaged with SW overall. Patient's gait is becoming increasingly unsteady. She had a fall last week, where she continues to have pain to her shoulder. Patient is still ambulating with her walker and occasionally her caned. Patient is open to physical therapy, which was also recommended by the NP. NP to sent over orders. Patient is also needing more in-home assistance with personal care. She does receive veterans benefits through [place]. SW provided education regarding the possibility of in-home assistance through the [place] and patient requested SW move forward with assisting her in applying for these benefits.   SW reinforced ongoing support and contact information.    CODE STATUS DNR ADVANCED DIRECTIVES: No MOST FORM COMPLETE:  Yes HOSPICE EDUCATION PROVIDED: No Duration of visit and documentation: 45 minutes   Latiqua Daloia, LCSW
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
## 2021-12-20 ENCOUNTER — Encounter: Payer: Self-pay | Admitting: *Deleted

## 2021-12-20 NOTE — Progress Notes (Signed)
Received a request from Laverda Sorenson NP to order home health PT/OT for this patient. Spoke with Freddie Breech RN with [facility] and they have agreed to service patient.

## 2021-12-23 ENCOUNTER — Ambulatory Visit (INDEPENDENT_AMBULATORY_CARE_PROVIDER_SITE_OTHER): Payer: Medicare Other

## 2021-12-23 ENCOUNTER — Ambulatory Visit (HOSPITAL_COMMUNITY)
Admission: RE | Admit: 2021-12-23 | Discharge: 2021-12-23 | Disposition: A | Discharge status: Home / Self Care | Payer: Medicare Other | Source: Ambulatory Visit | Attending: Family Medicine | Admitting: Family Medicine

## 2021-12-23 ENCOUNTER — Encounter (HOSPITAL_COMMUNITY): Payer: Self-pay

## 2021-12-23 VITALS — BP 94/62 | HR 104 | Temp 97.8°F | Resp 16 | Ht 71.0 in | Wt 199.3 lb

## 2021-12-23 DIAGNOSIS — M19012 Primary osteoarthritis, left shoulder: Secondary | ICD-10-CM | POA: Diagnosis not present

## 2021-12-23 DIAGNOSIS — W010XXA Fall on same level from slipping, tripping and stumbling without subsequent striking against object, initial encounter: Secondary | ICD-10-CM | POA: Insufficient documentation

## 2021-12-23 DIAGNOSIS — W19XXXA Unspecified fall, initial encounter: Secondary | ICD-10-CM | POA: Diagnosis not present

## 2021-12-23 DIAGNOSIS — I700 Atherosclerosis of aorta: Secondary | ICD-10-CM | POA: Diagnosis not present

## 2021-12-23 DIAGNOSIS — Z792 Long term (current) use of antibiotics: Secondary | ICD-10-CM | POA: Insufficient documentation

## 2021-12-23 DIAGNOSIS — R059 Cough, unspecified: Secondary | ICD-10-CM

## 2021-12-23 DIAGNOSIS — J22 Unspecified acute lower respiratory infection: Secondary | ICD-10-CM | POA: Diagnosis not present

## 2021-12-23 DIAGNOSIS — Z79899 Other long term (current) drug therapy: Secondary | ICD-10-CM | POA: Diagnosis not present

## 2021-12-23 DIAGNOSIS — S4992XA Unspecified injury of left shoulder and upper arm, initial encounter: Secondary | ICD-10-CM | POA: Diagnosis not present

## 2021-12-23 DIAGNOSIS — M25512 Pain in left shoulder: Secondary | ICD-10-CM | POA: Insufficient documentation

## 2021-12-23 DIAGNOSIS — U071 COVID-19: Secondary | ICD-10-CM | POA: Diagnosis not present

## 2021-12-23 DIAGNOSIS — R000 Tachycardia, unspecified: Secondary | ICD-10-CM | POA: Insufficient documentation

## 2021-12-23 DIAGNOSIS — I959 Hypotension, unspecified: Secondary | ICD-10-CM | POA: Diagnosis not present

## 2021-12-23 DIAGNOSIS — I10 Essential (primary) hypertension: Secondary | ICD-10-CM | POA: Diagnosis not present

## 2021-12-23 DIAGNOSIS — R058 Other specified cough: Secondary | ICD-10-CM | POA: Diagnosis not present

## 2021-12-23 MED ORDER — BENZONATATE 100 MG PO CAPS: 200.0000 mg | ORAL_CAPSULE | Freq: Three times a day (TID) | ORAL | 0 refills | Status: DC | PRN

## 2021-12-23 MED ORDER — DOXYCYCLINE HYCLATE 100 MG PO CAPS: 100.0000 mg | ORAL_CAPSULE | Freq: Two times a day (BID) | ORAL | 0 refills | Status: DC

## 2021-12-23 MED ORDER — LIDOCAINE 5 % EX PTCH: 1.0000 | MEDICATED_PATCH | CUTANEOUS | 0 refills | Status: DC

## 2021-12-23 NOTE — Discharge Instructions (Addendum)
X-ray negative for pneumonia. Treating for lower respiratory infection. Start Doxycyline 100 mg twice daily for 7 days. Benzonatate Pearls three times daily for cough. Left shoulder is not fractured, pain  is related to chronic arthritis, I will prescribed Lidocaine patches for acute pain. Blood pressure is very low today, check blood pressure tomorrow if blood pressure is less than 100/80 do not take blood pressure medication. I am unable to check a urinalysis today therefore if you develop any chills, nausea, vomiting or severe pain with urination go to the nearest emergency department.

## 2021-12-23 NOTE — ED Triage Notes (Signed)
Patient fell on the 17 th, Monday, and hurt her left shoulder.   Patient having cold/flu like symptoms that same Monday. Productive cough with milky and yellow phlegm. No fever, Patient had dry throat and slight runny nose. At home COVID test was negative.
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
## 2021-12-24 ENCOUNTER — Telehealth: Payer: Medicare Other | Admitting: Urgent Care

## 2021-12-24 LAB — SARS CORONAVIRUS 2 (TAT 6-24 HRS): SARS Coronavirus 2: POSITIVE — AB

## 2022-01-05 DIAGNOSIS — E669 Obesity, unspecified: Secondary | ICD-10-CM | POA: Diagnosis not present

## 2022-01-05 DIAGNOSIS — I739 Peripheral vascular disease, unspecified: Secondary | ICD-10-CM | POA: Diagnosis not present

## 2022-01-05 DIAGNOSIS — G8929 Other chronic pain: Secondary | ICD-10-CM | POA: Diagnosis not present

## 2022-01-05 DIAGNOSIS — M25562 Pain in left knee: Secondary | ICD-10-CM | POA: Diagnosis not present

## 2022-01-05 DIAGNOSIS — K5900 Constipation, unspecified: Secondary | ICD-10-CM | POA: Diagnosis not present

## 2022-01-05 DIAGNOSIS — Z86010 Personal history of colonic polyps: Secondary | ICD-10-CM | POA: Diagnosis not present

## 2022-01-05 DIAGNOSIS — M1990 Unspecified osteoarthritis, unspecified site: Secondary | ICD-10-CM | POA: Diagnosis not present

## 2022-01-05 DIAGNOSIS — C52 Malignant neoplasm of vagina: Secondary | ICD-10-CM | POA: Diagnosis not present

## 2022-01-05 DIAGNOSIS — D692 Other nonthrombocytopenic purpura: Secondary | ICD-10-CM | POA: Diagnosis not present

## 2022-01-05 DIAGNOSIS — I10 Essential (primary) hypertension: Secondary | ICD-10-CM | POA: Diagnosis not present

## 2022-01-11 ENCOUNTER — Other Ambulatory Visit: Payer: Self-pay | Admitting: Family Medicine

## 2022-01-11 DIAGNOSIS — I10 Essential (primary) hypertension: Secondary | ICD-10-CM | POA: Diagnosis not present

## 2022-01-11 DIAGNOSIS — M1712 Unilateral primary osteoarthritis, left knee: Secondary | ICD-10-CM

## 2022-01-11 DIAGNOSIS — M25562 Pain in left knee: Secondary | ICD-10-CM | POA: Diagnosis not present

## 2022-01-11 DIAGNOSIS — C52 Malignant neoplasm of vagina: Secondary | ICD-10-CM | POA: Diagnosis not present

## 2022-01-11 DIAGNOSIS — E669 Obesity, unspecified: Secondary | ICD-10-CM | POA: Diagnosis not present

## 2022-01-11 DIAGNOSIS — I739 Peripheral vascular disease, unspecified: Secondary | ICD-10-CM | POA: Diagnosis not present

## 2022-01-11 DIAGNOSIS — G8929 Other chronic pain: Secondary | ICD-10-CM | POA: Diagnosis not present

## 2022-01-12 ENCOUNTER — Telehealth: Payer: Self-pay | Admitting: Family Medicine

## 2022-01-12 DIAGNOSIS — E669 Obesity, unspecified: Secondary | ICD-10-CM | POA: Diagnosis not present

## 2022-01-12 DIAGNOSIS — I739 Peripheral vascular disease, unspecified: Secondary | ICD-10-CM | POA: Diagnosis not present

## 2022-01-12 DIAGNOSIS — M25562 Pain in left knee: Secondary | ICD-10-CM | POA: Diagnosis not present

## 2022-01-12 DIAGNOSIS — G8929 Other chronic pain: Secondary | ICD-10-CM | POA: Diagnosis not present

## 2022-01-12 DIAGNOSIS — I10 Essential (primary) hypertension: Secondary | ICD-10-CM | POA: Diagnosis not present

## 2022-01-12 DIAGNOSIS — C52 Malignant neoplasm of vagina: Secondary | ICD-10-CM | POA: Diagnosis not present

## 2022-01-12 NOTE — Telephone Encounter (Signed)
Home health OT 1x4. Can leave a voicemail

## 2022-01-17 ENCOUNTER — Other Ambulatory Visit: Payer: Medicare Other

## 2022-01-17 ENCOUNTER — Telehealth: Payer: Self-pay | Admitting: Family Medicine

## 2022-01-17 DIAGNOSIS — I739 Peripheral vascular disease, unspecified: Secondary | ICD-10-CM | POA: Diagnosis not present

## 2022-01-17 DIAGNOSIS — E669 Obesity, unspecified: Secondary | ICD-10-CM | POA: Diagnosis not present

## 2022-01-17 DIAGNOSIS — G8929 Other chronic pain: Secondary | ICD-10-CM | POA: Diagnosis not present

## 2022-01-17 DIAGNOSIS — I10 Essential (primary) hypertension: Secondary | ICD-10-CM | POA: Diagnosis not present

## 2022-01-17 DIAGNOSIS — Z515 Encounter for palliative care: Secondary | ICD-10-CM

## 2022-01-17 DIAGNOSIS — C52 Malignant neoplasm of vagina: Secondary | ICD-10-CM | POA: Diagnosis not present

## 2022-01-17 DIAGNOSIS — M25562 Pain in left knee: Secondary | ICD-10-CM | POA: Diagnosis not present

## 2022-01-17 NOTE — Telephone Encounter (Signed)
VO left on Mia Simpson's voicemail.

## 2022-01-17 NOTE — Progress Notes (Signed)
COMMUNITY PALLIATIVE CARE SW NOTE  PATIENT NAME: Kawanda Drumheller DOB: 1928/05/13 MRN: [id number]  PRIMARY CARE PROVIDER: [name], [name], MD  RESPONSIBLE PARTY:  Acct ID - Guarantor Home Phone Work Phone Relationship Acct Type  [id number] - [name] [phone]  Self P/F     [place], [place], [place] [address]   SOCIAL WORK TELEPHONIC ENCOUNTER  PC SW completed a telephonic encounter with patient's daughter-Rosemary. She advised that patient is interested in in-home personal care support. SW provided resources for in-home care agencies. SW also provided education regarding the possibility of accessing [facility] benefits for  in-home assistance. SW encouraged the daughter to contact VA to discuss patient's eligibility for in-home services. SW also advised the daughter to contact patient's insurance to find out if in-home services are a part of her benefit. The daughter thanked SW for the return call and verbalized understanding of information/education provided.  No other concerns noted.    [address] [place], [place]
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
## 2022-01-17 NOTE — Telephone Encounter (Signed)
I called and spoke with patient's daughter. Patient has been having low blood pressure readings. Denies any chest pain, palpitations or shortness of breath. Appointment had been made for next available with pcp which is tomorrow at 4:30pm. Patient's daughter is aware that if she begins with any shortness of breath, chest pain, palpitations, she will need to go to the ED.

## 2022-01-17 NOTE — Telephone Encounter (Signed)
Calling because patient's bp is 81/45. Triage advised to be seen within or go to urgent care emergency.please advise

## 2022-01-18 ENCOUNTER — Encounter: Payer: Self-pay | Admitting: Family Medicine

## 2022-01-18 ENCOUNTER — Ambulatory Visit (INDEPENDENT_AMBULATORY_CARE_PROVIDER_SITE_OTHER): Payer: Medicare Other | Admitting: Family Medicine

## 2022-01-18 VITALS — BP 98/60 | HR 73 | Resp 16 | Ht 71.0 in | Wt 166.5 lb

## 2022-01-18 DIAGNOSIS — R296 Repeated falls: Secondary | ICD-10-CM

## 2022-01-18 DIAGNOSIS — I10 Essential (primary) hypertension: Secondary | ICD-10-CM | POA: Diagnosis not present

## 2022-01-18 DIAGNOSIS — R600 Localized edema: Secondary | ICD-10-CM

## 2022-01-18 DIAGNOSIS — C52 Malignant neoplasm of vagina: Secondary | ICD-10-CM

## 2022-01-18 DIAGNOSIS — D329 Benign neoplasm of meninges, unspecified: Secondary | ICD-10-CM

## 2022-01-18 MED ORDER — FUROSEMIDE 20 MG PO TABS: 10.0000 mg | ORAL_TABLET | Freq: Every day | ORAL | 3 refills | Status: DC | PRN

## 2022-01-18 NOTE — Patient Instructions (Addendum)
A few things to remember from today's visit:   Benign essential HTN  Bilateral lower extremity edema - Plan: furosemide (LASIX) 20 MG tablet  Frequent falls  Vaginal cancer ([place])  If you need refills please call your pharmacy. Do not use My Chart to request refills or for acute issues that need immediate attention.   Stop Hydrochlorothiazide. Furosemide to take 1/2 to 1 tab daily as needed for swelling. No changes in rest of meds. Continue PT. Fall prevention.  Please be sure medication list is accurate. If a new problem present, please set up appointment sooner than planned today.  Fall Prevention in the Home, Adult Falls can cause injuries and can happen to people of all ages. There are many things you can do to make your home safe and to help prevent falls. Ask for help when making these changes. What actions can I take to prevent falls? General Instructions Use good lighting in all rooms. Replace any light bulbs that burn out. Turn on the lights in dark areas. Use night-lights. Keep items that you use often in easy-to-reach places. Lower the shelves around your home if needed. Set up your furniture so you have a clear path. Avoid moving your furniture around. Do not have throw rugs or other things on the floor that can make you trip. Avoid walking on wet floors. If any of your floors are uneven, fix them. Add color or contrast paint or tape to clearly mark and help you see: Grab bars or handrails. First and last steps of staircases. Where the edge of each step is. If you use a stepladder: Make sure that it is fully opened. Do not climb a closed stepladder. Make sure the sides of the stepladder are locked in place. Ask someone to hold the stepladder while you use it. Know where your pets are when moving through your home. What can I do in the bathroom?     Keep the floor dry. Clean up any water on the floor right away. Remove soap buildup in the tub or shower. Use  nonskid mats or decals on the floor of the tub or shower. Attach bath mats securely with double-sided, nonslip rug tape. If you need to sit down in the shower, use a plastic, nonslip stool. Install grab bars by the toilet and in the tub and shower. Do not use towel bars as grab bars. What can I do in the bedroom? Make sure that you have a light by your bed that is easy to reach. Do not use any sheets or blankets for your bed that hang to the floor. Have a firm chair with side arms that you can use for support when you get dressed. What can I do in the kitchen? Clean up any spills right away. If you need to reach something above you, use a step stool with a grab bar. Keep electrical cords out of the way. Do not use floor polish or wax that makes floors slippery. What can I do with my stairs? Do not leave any items on the stairs. Make sure that you have a light switch at the top and the bottom of the stairs. Make sure that there are handrails on both sides of the stairs. Fix handrails that are broken or loose. Install nonslip stair treads on all your stairs. Avoid having throw rugs at the top or bottom of the stairs. Choose a carpet that does not hide the edge of the steps on the stairs. Check carpeting to  make sure that it is firmly attached to the stairs. Fix carpet that is loose or worn. What can I do on the outside of my home? Use bright outdoor lighting. Fix the edges of walkways and driveways and fix any cracks. Remove anything that might make you trip as you walk through a door, such as a raised step or threshold. Trim any bushes or trees on paths to your home. Check to see if handrails are loose or broken and that both sides of all steps have handrails. Install guardrails along the edges of any raised decks and porches. Clear paths of anything that can make you trip, such as tools or rocks. Have leaves, snow, or ice cleared regularly. Use sand or salt on paths during  winter. Clean up any spills in your garage right away. This includes grease or oil spills. What other actions can I take? Wear shoes that: Have a low heel. Do not wear high heels. Have rubber bottoms. Feel good on your feet and fit well. Are closed at the toe. Do not wear open-toe sandals. Use tools that help you move around if needed. These include: Canes. Walkers. Scooters. Crutches. Review your medicines with your doctor. Some medicines can make you feel dizzy. This can increase your chance of falling. Ask your doctor what else you can do to help prevent falls. Where to find more information Centers for Disease Control and Prevention, [employer]: http://www.wolf.info/ National Institute on Aging: http://kim-miller.com/ Contact a doctor if: You are afraid of falling at home. You feel weak, drowsy, or dizzy at home. You fall at home. Summary There are many simple things that you can do to make your home safe and to help prevent falls. Ways to make your home safe include removing things that can make you trip and installing grab bars in the bathroom. Ask for help when making these changes in your home. This information is not intended to replace advice given to you by your health care provider. Make sure you discuss any questions you have with your health care provider. Document Revised: 02/14/2021 Document Reviewed: 12/17/2019 Elsevier Patient Education  [place].

## 2022-01-19 DIAGNOSIS — C52 Malignant neoplasm of vagina: Secondary | ICD-10-CM | POA: Diagnosis not present

## 2022-01-19 DIAGNOSIS — E669 Obesity, unspecified: Secondary | ICD-10-CM | POA: Diagnosis not present

## 2022-01-19 DIAGNOSIS — I739 Peripheral vascular disease, unspecified: Secondary | ICD-10-CM | POA: Diagnosis not present

## 2022-01-19 DIAGNOSIS — M25562 Pain in left knee: Secondary | ICD-10-CM | POA: Diagnosis not present

## 2022-01-19 DIAGNOSIS — G8929 Other chronic pain: Secondary | ICD-10-CM | POA: Diagnosis not present

## 2022-01-19 DIAGNOSIS — I10 Essential (primary) hypertension: Secondary | ICD-10-CM | POA: Diagnosis not present

## 2022-01-26 DIAGNOSIS — E669 Obesity, unspecified: Secondary | ICD-10-CM | POA: Diagnosis not present

## 2022-01-26 DIAGNOSIS — I739 Peripheral vascular disease, unspecified: Secondary | ICD-10-CM | POA: Diagnosis not present

## 2022-01-26 DIAGNOSIS — G8929 Other chronic pain: Secondary | ICD-10-CM | POA: Diagnosis not present

## 2022-01-26 DIAGNOSIS — C52 Malignant neoplasm of vagina: Secondary | ICD-10-CM | POA: Diagnosis not present

## 2022-01-26 DIAGNOSIS — M25562 Pain in left knee: Secondary | ICD-10-CM | POA: Diagnosis not present

## 2022-01-26 DIAGNOSIS — I10 Essential (primary) hypertension: Secondary | ICD-10-CM | POA: Diagnosis not present

## 2022-02-01 DIAGNOSIS — M25562 Pain in left knee: Secondary | ICD-10-CM | POA: Diagnosis not present

## 2022-02-01 DIAGNOSIS — I10 Essential (primary) hypertension: Secondary | ICD-10-CM | POA: Diagnosis not present

## 2022-02-01 DIAGNOSIS — G8929 Other chronic pain: Secondary | ICD-10-CM | POA: Diagnosis not present

## 2022-02-01 DIAGNOSIS — E669 Obesity, unspecified: Secondary | ICD-10-CM | POA: Diagnosis not present

## 2022-02-01 DIAGNOSIS — I739 Peripheral vascular disease, unspecified: Secondary | ICD-10-CM | POA: Diagnosis not present

## 2022-02-01 DIAGNOSIS — C52 Malignant neoplasm of vagina: Secondary | ICD-10-CM | POA: Diagnosis not present

## 2022-02-02 DIAGNOSIS — I739 Peripheral vascular disease, unspecified: Secondary | ICD-10-CM | POA: Diagnosis not present

## 2022-02-02 DIAGNOSIS — C52 Malignant neoplasm of vagina: Secondary | ICD-10-CM | POA: Diagnosis not present

## 2022-02-02 DIAGNOSIS — I10 Essential (primary) hypertension: Secondary | ICD-10-CM | POA: Diagnosis not present

## 2022-02-02 DIAGNOSIS — E669 Obesity, unspecified: Secondary | ICD-10-CM | POA: Diagnosis not present

## 2022-02-02 DIAGNOSIS — M25562 Pain in left knee: Secondary | ICD-10-CM | POA: Diagnosis not present

## 2022-02-02 DIAGNOSIS — G8929 Other chronic pain: Secondary | ICD-10-CM | POA: Diagnosis not present

## 2022-02-03 DIAGNOSIS — I739 Peripheral vascular disease, unspecified: Secondary | ICD-10-CM | POA: Diagnosis not present

## 2022-02-03 DIAGNOSIS — E669 Obesity, unspecified: Secondary | ICD-10-CM | POA: Diagnosis not present

## 2022-02-03 DIAGNOSIS — G8929 Other chronic pain: Secondary | ICD-10-CM | POA: Diagnosis not present

## 2022-02-03 DIAGNOSIS — M25562 Pain in left knee: Secondary | ICD-10-CM | POA: Diagnosis not present

## 2022-02-03 DIAGNOSIS — I10 Essential (primary) hypertension: Secondary | ICD-10-CM | POA: Diagnosis not present

## 2022-02-03 DIAGNOSIS — C52 Malignant neoplasm of vagina: Secondary | ICD-10-CM | POA: Diagnosis not present

## 2022-02-04 DIAGNOSIS — I10 Essential (primary) hypertension: Secondary | ICD-10-CM | POA: Diagnosis not present

## 2022-02-04 DIAGNOSIS — Z86010 Personal history of colonic polyps: Secondary | ICD-10-CM | POA: Diagnosis not present

## 2022-02-04 DIAGNOSIS — C52 Malignant neoplasm of vagina: Secondary | ICD-10-CM | POA: Diagnosis not present

## 2022-02-04 DIAGNOSIS — I739 Peripheral vascular disease, unspecified: Secondary | ICD-10-CM | POA: Diagnosis not present

## 2022-02-04 DIAGNOSIS — E669 Obesity, unspecified: Secondary | ICD-10-CM | POA: Diagnosis not present

## 2022-02-04 DIAGNOSIS — M1990 Unspecified osteoarthritis, unspecified site: Secondary | ICD-10-CM | POA: Diagnosis not present

## 2022-02-04 DIAGNOSIS — M25562 Pain in left knee: Secondary | ICD-10-CM | POA: Diagnosis not present

## 2022-02-04 DIAGNOSIS — K5900 Constipation, unspecified: Secondary | ICD-10-CM | POA: Diagnosis not present

## 2022-02-04 DIAGNOSIS — G8929 Other chronic pain: Secondary | ICD-10-CM | POA: Diagnosis not present

## 2022-02-04 DIAGNOSIS — D692 Other nonthrombocytopenic purpura: Secondary | ICD-10-CM | POA: Diagnosis not present

## 2022-02-09 DIAGNOSIS — E669 Obesity, unspecified: Secondary | ICD-10-CM | POA: Diagnosis not present

## 2022-02-09 DIAGNOSIS — I10 Essential (primary) hypertension: Secondary | ICD-10-CM | POA: Diagnosis not present

## 2022-02-09 DIAGNOSIS — C52 Malignant neoplasm of vagina: Secondary | ICD-10-CM | POA: Diagnosis not present

## 2022-02-09 DIAGNOSIS — M25562 Pain in left knee: Secondary | ICD-10-CM | POA: Diagnosis not present

## 2022-02-09 DIAGNOSIS — I739 Peripheral vascular disease, unspecified: Secondary | ICD-10-CM | POA: Diagnosis not present

## 2022-02-09 DIAGNOSIS — G8929 Other chronic pain: Secondary | ICD-10-CM | POA: Diagnosis not present

## 2022-02-10 DIAGNOSIS — I10 Essential (primary) hypertension: Secondary | ICD-10-CM | POA: Diagnosis not present

## 2022-02-10 DIAGNOSIS — E669 Obesity, unspecified: Secondary | ICD-10-CM | POA: Diagnosis not present

## 2022-02-10 DIAGNOSIS — G8929 Other chronic pain: Secondary | ICD-10-CM | POA: Diagnosis not present

## 2022-02-10 DIAGNOSIS — I739 Peripheral vascular disease, unspecified: Secondary | ICD-10-CM | POA: Diagnosis not present

## 2022-02-10 DIAGNOSIS — M25562 Pain in left knee: Secondary | ICD-10-CM | POA: Diagnosis not present

## 2022-02-10 DIAGNOSIS — C52 Malignant neoplasm of vagina: Secondary | ICD-10-CM | POA: Diagnosis not present

## 2022-02-16 DIAGNOSIS — E669 Obesity, unspecified: Secondary | ICD-10-CM | POA: Diagnosis not present

## 2022-02-16 DIAGNOSIS — I10 Essential (primary) hypertension: Secondary | ICD-10-CM | POA: Diagnosis not present

## 2022-02-16 DIAGNOSIS — I739 Peripheral vascular disease, unspecified: Secondary | ICD-10-CM | POA: Diagnosis not present

## 2022-02-16 DIAGNOSIS — M25562 Pain in left knee: Secondary | ICD-10-CM | POA: Diagnosis not present

## 2022-02-16 DIAGNOSIS — C52 Malignant neoplasm of vagina: Secondary | ICD-10-CM | POA: Diagnosis not present

## 2022-02-16 DIAGNOSIS — G8929 Other chronic pain: Secondary | ICD-10-CM | POA: Diagnosis not present

## 2022-02-17 DIAGNOSIS — H903 Sensorineural hearing loss, bilateral: Secondary | ICD-10-CM | POA: Diagnosis not present

## 2022-02-17 DIAGNOSIS — H6123 Impacted cerumen, bilateral: Secondary | ICD-10-CM | POA: Diagnosis not present

## 2022-02-22 DIAGNOSIS — E669 Obesity, unspecified: Secondary | ICD-10-CM | POA: Diagnosis not present

## 2022-02-22 DIAGNOSIS — G8929 Other chronic pain: Secondary | ICD-10-CM | POA: Diagnosis not present

## 2022-02-22 DIAGNOSIS — I739 Peripheral vascular disease, unspecified: Secondary | ICD-10-CM | POA: Diagnosis not present

## 2022-02-22 DIAGNOSIS — C52 Malignant neoplasm of vagina: Secondary | ICD-10-CM | POA: Diagnosis not present

## 2022-02-22 DIAGNOSIS — M25562 Pain in left knee: Secondary | ICD-10-CM | POA: Diagnosis not present

## 2022-02-22 DIAGNOSIS — I10 Essential (primary) hypertension: Secondary | ICD-10-CM | POA: Diagnosis not present

## 2022-02-27 DIAGNOSIS — M1712 Unilateral primary osteoarthritis, left knee: Secondary | ICD-10-CM | POA: Diagnosis not present

## 2022-03-07 ENCOUNTER — Telehealth: Payer: Self-pay | Admitting: Family Medicine

## 2022-03-07 NOTE — Telephone Encounter (Signed)
I called and spoke with [facility]. She is aware that PCP would prefer for the hospice provider to do the orders, but would like to be kept informed. Verbal given for hospice referral.

## 2022-03-07 NOTE — Telephone Encounter (Signed)
Hospice referral needed. Requesting PCP to be hospice attending and does she want to send comfort care orders or does PCP want hospice physician to handle that. Oral certification of terminal illness stating patient hs life expectancy of 6 mos or less if illness runs normal course.

## 2022-03-07 NOTE — Telephone Encounter (Signed)
Hospice orders can be placed by hospice agency provider but I would appreciate if I am kept informed about changes. Thanks, BJ

## 2022-03-08 DIAGNOSIS — Z9221 Personal history of antineoplastic chemotherapy: Secondary | ICD-10-CM | POA: Diagnosis not present

## 2022-03-08 DIAGNOSIS — M1990 Unspecified osteoarthritis, unspecified site: Secondary | ICD-10-CM | POA: Diagnosis not present

## 2022-03-08 DIAGNOSIS — C52 Malignant neoplasm of vagina: Secondary | ICD-10-CM | POA: Diagnosis not present

## 2022-03-08 DIAGNOSIS — D329 Benign neoplasm of meninges, unspecified: Secondary | ICD-10-CM | POA: Diagnosis not present

## 2022-03-08 DIAGNOSIS — Z923 Personal history of irradiation: Secondary | ICD-10-CM | POA: Diagnosis not present

## 2022-03-08 DIAGNOSIS — I700 Atherosclerosis of aorta: Secondary | ICD-10-CM | POA: Diagnosis not present

## 2022-03-08 DIAGNOSIS — I10 Essential (primary) hypertension: Secondary | ICD-10-CM | POA: Diagnosis not present

## 2022-03-08 DIAGNOSIS — I70209 Unspecified atherosclerosis of native arteries of extremities, unspecified extremity: Secondary | ICD-10-CM | POA: Diagnosis not present

## 2022-03-08 DIAGNOSIS — J302 Other seasonal allergic rhinitis: Secondary | ICD-10-CM | POA: Diagnosis not present

## 2022-03-08 DIAGNOSIS — N3281 Overactive bladder: Secondary | ICD-10-CM | POA: Diagnosis not present

## 2022-03-08 DIAGNOSIS — I739 Peripheral vascular disease, unspecified: Secondary | ICD-10-CM | POA: Diagnosis not present

## 2022-03-08 DIAGNOSIS — F0150 Vascular dementia without behavioral disturbance: Secondary | ICD-10-CM | POA: Diagnosis not present

## 2022-03-09 DIAGNOSIS — M1990 Unspecified osteoarthritis, unspecified site: Secondary | ICD-10-CM | POA: Diagnosis not present

## 2022-03-09 DIAGNOSIS — C52 Malignant neoplasm of vagina: Secondary | ICD-10-CM | POA: Diagnosis not present

## 2022-03-09 DIAGNOSIS — I70209 Unspecified atherosclerosis of native arteries of extremities, unspecified extremity: Secondary | ICD-10-CM | POA: Diagnosis not present

## 2022-03-09 DIAGNOSIS — I700 Atherosclerosis of aorta: Secondary | ICD-10-CM | POA: Diagnosis not present

## 2022-03-09 DIAGNOSIS — I10 Essential (primary) hypertension: Secondary | ICD-10-CM | POA: Diagnosis not present

## 2022-03-09 DIAGNOSIS — F0150 Vascular dementia without behavioral disturbance: Secondary | ICD-10-CM | POA: Diagnosis not present

## 2022-03-10 ENCOUNTER — Other Ambulatory Visit: Payer: Self-pay | Admitting: Family Medicine

## 2022-03-10 DIAGNOSIS — M1990 Unspecified osteoarthritis, unspecified site: Secondary | ICD-10-CM | POA: Diagnosis not present

## 2022-03-10 DIAGNOSIS — M1712 Unilateral primary osteoarthritis, left knee: Secondary | ICD-10-CM

## 2022-03-10 DIAGNOSIS — I700 Atherosclerosis of aorta: Secondary | ICD-10-CM | POA: Diagnosis not present

## 2022-03-10 DIAGNOSIS — I10 Essential (primary) hypertension: Secondary | ICD-10-CM | POA: Diagnosis not present

## 2022-03-10 DIAGNOSIS — I70209 Unspecified atherosclerosis of native arteries of extremities, unspecified extremity: Secondary | ICD-10-CM | POA: Diagnosis not present

## 2022-03-10 DIAGNOSIS — C52 Malignant neoplasm of vagina: Secondary | ICD-10-CM | POA: Diagnosis not present

## 2022-03-10 DIAGNOSIS — Z23 Encounter for immunization: Secondary | ICD-10-CM | POA: Diagnosis not present

## 2022-03-10 DIAGNOSIS — F0150 Vascular dementia without behavioral disturbance: Secondary | ICD-10-CM | POA: Diagnosis not present

## 2022-03-15 DIAGNOSIS — M1990 Unspecified osteoarthritis, unspecified site: Secondary | ICD-10-CM | POA: Diagnosis not present

## 2022-03-15 DIAGNOSIS — I10 Essential (primary) hypertension: Secondary | ICD-10-CM | POA: Diagnosis not present

## 2022-03-15 DIAGNOSIS — I70209 Unspecified atherosclerosis of native arteries of extremities, unspecified extremity: Secondary | ICD-10-CM | POA: Diagnosis not present

## 2022-03-15 DIAGNOSIS — C52 Malignant neoplasm of vagina: Secondary | ICD-10-CM | POA: Diagnosis not present

## 2022-03-15 DIAGNOSIS — I700 Atherosclerosis of aorta: Secondary | ICD-10-CM | POA: Diagnosis not present

## 2022-03-15 DIAGNOSIS — F0150 Vascular dementia without behavioral disturbance: Secondary | ICD-10-CM | POA: Diagnosis not present

## 2022-03-16 DIAGNOSIS — I70209 Unspecified atherosclerosis of native arteries of extremities, unspecified extremity: Secondary | ICD-10-CM | POA: Diagnosis not present

## 2022-03-16 DIAGNOSIS — F0150 Vascular dementia without behavioral disturbance: Secondary | ICD-10-CM | POA: Diagnosis not present

## 2022-03-16 DIAGNOSIS — I700 Atherosclerosis of aorta: Secondary | ICD-10-CM | POA: Diagnosis not present

## 2022-03-16 DIAGNOSIS — M1990 Unspecified osteoarthritis, unspecified site: Secondary | ICD-10-CM | POA: Diagnosis not present

## 2022-03-16 DIAGNOSIS — I10 Essential (primary) hypertension: Secondary | ICD-10-CM | POA: Diagnosis not present

## 2022-03-16 DIAGNOSIS — C52 Malignant neoplasm of vagina: Secondary | ICD-10-CM | POA: Diagnosis not present

## 2022-03-20 DIAGNOSIS — I70209 Unspecified atherosclerosis of native arteries of extremities, unspecified extremity: Secondary | ICD-10-CM | POA: Diagnosis not present

## 2022-03-20 DIAGNOSIS — I10 Essential (primary) hypertension: Secondary | ICD-10-CM | POA: Diagnosis not present

## 2022-03-20 DIAGNOSIS — F0150 Vascular dementia without behavioral disturbance: Secondary | ICD-10-CM | POA: Diagnosis not present

## 2022-03-20 DIAGNOSIS — I700 Atherosclerosis of aorta: Secondary | ICD-10-CM | POA: Diagnosis not present

## 2022-03-20 DIAGNOSIS — M1990 Unspecified osteoarthritis, unspecified site: Secondary | ICD-10-CM | POA: Diagnosis not present

## 2022-03-20 DIAGNOSIS — C52 Malignant neoplasm of vagina: Secondary | ICD-10-CM | POA: Diagnosis not present

## 2022-03-21 ENCOUNTER — Encounter (HOSPITAL_COMMUNITY): Payer: Self-pay | Admitting: Emergency Medicine

## 2022-03-21 ENCOUNTER — Inpatient Hospital Stay (HOSPITAL_COMMUNITY)
Admission: EM | Admit: 2022-03-21 | Discharge: 2022-03-24 | DRG: 755 | Disposition: A | Discharge status: Hospice | Attending: Internal Medicine | Admitting: Internal Medicine

## 2022-03-21 ENCOUNTER — Emergency Department (HOSPITAL_COMMUNITY)

## 2022-03-21 ENCOUNTER — Other Ambulatory Visit: Payer: Self-pay

## 2022-03-21 DIAGNOSIS — Z79899 Other long term (current) drug therapy: Secondary | ICD-10-CM

## 2022-03-21 DIAGNOSIS — M25551 Pain in right hip: Secondary | ICD-10-CM | POA: Diagnosis not present

## 2022-03-21 DIAGNOSIS — N390 Urinary tract infection, site not specified: Secondary | ICD-10-CM | POA: Diagnosis not present

## 2022-03-21 DIAGNOSIS — M1612 Unilateral primary osteoarthritis, left hip: Secondary | ICD-10-CM | POA: Diagnosis not present

## 2022-03-21 DIAGNOSIS — D649 Anemia, unspecified: Secondary | ICD-10-CM | POA: Diagnosis not present

## 2022-03-21 DIAGNOSIS — E8809 Other disorders of plasma-protein metabolism, not elsewhere classified: Secondary | ICD-10-CM | POA: Diagnosis present

## 2022-03-21 DIAGNOSIS — Z803 Family history of malignant neoplasm of breast: Secondary | ICD-10-CM

## 2022-03-21 DIAGNOSIS — Z96651 Presence of right artificial knee joint: Secondary | ICD-10-CM | POA: Diagnosis present

## 2022-03-21 DIAGNOSIS — M1990 Unspecified osteoarthritis, unspecified site: Secondary | ICD-10-CM | POA: Diagnosis present

## 2022-03-21 DIAGNOSIS — D500 Iron deficiency anemia secondary to blood loss (chronic): Secondary | ICD-10-CM | POA: Diagnosis present

## 2022-03-21 DIAGNOSIS — E785 Hyperlipidemia, unspecified: Secondary | ICD-10-CM | POA: Diagnosis present

## 2022-03-21 DIAGNOSIS — I70209 Unspecified atherosclerosis of native arteries of extremities, unspecified extremity: Secondary | ICD-10-CM | POA: Diagnosis not present

## 2022-03-21 DIAGNOSIS — Z8543 Personal history of malignant neoplasm of ovary: Secondary | ICD-10-CM

## 2022-03-21 DIAGNOSIS — M79606 Pain in leg, unspecified: Secondary | ICD-10-CM | POA: Diagnosis not present

## 2022-03-21 DIAGNOSIS — R4182 Altered mental status, unspecified: Secondary | ICD-10-CM | POA: Diagnosis not present

## 2022-03-21 DIAGNOSIS — Z043 Encounter for examination and observation following other accident: Secondary | ICD-10-CM | POA: Diagnosis not present

## 2022-03-21 DIAGNOSIS — I700 Atherosclerosis of aorta: Secondary | ICD-10-CM | POA: Diagnosis not present

## 2022-03-21 DIAGNOSIS — M76892 Other specified enthesopathies of left lower limb, excluding foot: Secondary | ICD-10-CM | POA: Diagnosis not present

## 2022-03-21 DIAGNOSIS — W19XXXA Unspecified fall, initial encounter: Secondary | ICD-10-CM

## 2022-03-21 DIAGNOSIS — M1712 Unilateral primary osteoarthritis, left knee: Secondary | ICD-10-CM | POA: Diagnosis not present

## 2022-03-21 DIAGNOSIS — Z825 Family history of asthma and other chronic lower respiratory diseases: Secondary | ICD-10-CM

## 2022-03-21 DIAGNOSIS — Z1152 Encounter for screening for COVID-19: Secondary | ICD-10-CM

## 2022-03-21 DIAGNOSIS — Z833 Family history of diabetes mellitus: Secondary | ICD-10-CM

## 2022-03-21 DIAGNOSIS — M7989 Other specified soft tissue disorders: Secondary | ICD-10-CM | POA: Diagnosis not present

## 2022-03-21 DIAGNOSIS — Z923 Personal history of irradiation: Secondary | ICD-10-CM

## 2022-03-21 DIAGNOSIS — Z471 Aftercare following joint replacement surgery: Secondary | ICD-10-CM | POA: Diagnosis not present

## 2022-03-21 DIAGNOSIS — Z8249 Family history of ischemic heart disease and other diseases of the circulatory system: Secondary | ICD-10-CM

## 2022-03-21 DIAGNOSIS — R296 Repeated falls: Secondary | ICD-10-CM | POA: Diagnosis present

## 2022-03-21 DIAGNOSIS — L89152 Pressure ulcer of sacral region, stage 2: Secondary | ICD-10-CM | POA: Diagnosis present

## 2022-03-21 DIAGNOSIS — M19011 Primary osteoarthritis, right shoulder: Secondary | ICD-10-CM | POA: Diagnosis not present

## 2022-03-21 DIAGNOSIS — R531 Weakness: Secondary | ICD-10-CM | POA: Diagnosis not present

## 2022-03-21 DIAGNOSIS — I951 Orthostatic hypotension: Secondary | ICD-10-CM | POA: Diagnosis present

## 2022-03-21 DIAGNOSIS — I10 Essential (primary) hypertension: Secondary | ICD-10-CM | POA: Diagnosis not present

## 2022-03-21 DIAGNOSIS — C52 Malignant neoplasm of vagina: Principal | ICD-10-CM | POA: Diagnosis present

## 2022-03-21 DIAGNOSIS — Z823 Family history of stroke: Secondary | ICD-10-CM

## 2022-03-21 DIAGNOSIS — M19012 Primary osteoarthritis, left shoulder: Secondary | ICD-10-CM | POA: Diagnosis not present

## 2022-03-21 DIAGNOSIS — R079 Chest pain, unspecified: Secondary | ICD-10-CM | POA: Diagnosis not present

## 2022-03-21 DIAGNOSIS — Z66 Do not resuscitate: Secondary | ICD-10-CM | POA: Diagnosis present

## 2022-03-21 DIAGNOSIS — F0150 Vascular dementia without behavioral disturbance: Secondary | ICD-10-CM | POA: Diagnosis not present

## 2022-03-21 DIAGNOSIS — R9431 Abnormal electrocardiogram [ECG] [EKG]: Secondary | ICD-10-CM | POA: Diagnosis not present

## 2022-03-21 DIAGNOSIS — Z9221 Personal history of antineoplastic chemotherapy: Secondary | ICD-10-CM

## 2022-03-21 DIAGNOSIS — I6782 Cerebral ischemia: Secondary | ICD-10-CM | POA: Diagnosis not present

## 2022-03-21 DIAGNOSIS — M25712 Osteophyte, left shoulder: Secondary | ICD-10-CM | POA: Diagnosis not present

## 2022-03-21 LAB — RETICULOCYTES
Immature Retic Fract: 24.5 % — ABNORMAL HIGH (ref 2.3–15.9)
RBC.: 2.71 MIL/uL — ABNORMAL LOW (ref 3.87–5.11)
Retic Count, Absolute: 52 10*3/uL (ref 19.0–186.0)
Retic Ct Pct: 1.9 % (ref 0.4–3.1)

## 2022-03-21 LAB — FERRITIN: Ferritin: 315 ng/mL — ABNORMAL HIGH (ref 11–307)

## 2022-03-21 LAB — URINALYSIS, ROUTINE W REFLEX MICROSCOPIC
Bilirubin Urine: NEGATIVE
Glucose, UA: NEGATIVE mg/dL
Ketones, ur: NEGATIVE mg/dL
Nitrite: NEGATIVE
Protein, ur: >=300 mg/dL — AB
RBC / HPF: >50 RBC/hpf — ABNORMAL HIGH (ref 0–5)
Specific Gravity, Urine: 1.014 (ref 1.005–1.030)
WBC, UA: >50 WBC/hpf — ABNORMAL HIGH (ref 0–5)
pH: 8 (ref 5.0–8.0)

## 2022-03-21 LAB — COMPREHENSIVE METABOLIC PANEL
ALT: 16 U/L (ref 0–44)
AST: 27 U/L (ref 15–41)
Albumin: 2.3 g/dL — ABNORMAL LOW (ref 3.5–5.0)
Alkaline Phosphatase: 55 U/L (ref 38–126)
Anion gap: 12 (ref 5–15)
BUN: 30 mg/dL — ABNORMAL HIGH (ref 8–23)
CO2: 21 mmol/L — ABNORMAL LOW (ref 22–32)
Calcium: 8.8 mg/dL — ABNORMAL LOW (ref 8.9–10.3)
Chloride: 108 mmol/L (ref 98–111)
Creatinine, Ser: 1.02 mg/dL — ABNORMAL HIGH (ref 0.44–1.00)
GFR, Estimated: 52 mL/min — ABNORMAL LOW (ref >60)
Glucose, Bld: 103 mg/dL — ABNORMAL HIGH (ref 70–99)
Potassium: 4.6 mmol/L (ref 3.5–5.1)
Sodium: 141 mmol/L (ref 135–145)
Total Bilirubin: 0.6 mg/dL (ref 0.3–1.2)
Total Protein: 6.4 g/dL — ABNORMAL LOW (ref 6.5–8.1)

## 2022-03-21 LAB — CBC
HCT: 24.5 % — ABNORMAL LOW (ref 36.0–46.0)
Hemoglobin: 7.4 g/dL — ABNORMAL LOW (ref 12.0–15.0)
MCH: 27.3 pg (ref 26.0–34.0)
MCHC: 30.2 g/dL (ref 30.0–36.0)
MCV: 90.4 fL (ref 80.0–100.0)
Platelets: 445 10*3/uL — ABNORMAL HIGH (ref 150–400)
RBC: 2.71 MIL/uL — ABNORMAL LOW (ref 3.87–5.11)
RDW: 16.7 % — ABNORMAL HIGH (ref 11.5–15.5)
WBC: 13.8 10*3/uL — ABNORMAL HIGH (ref 4.0–10.5)
nRBC: 0 % (ref 0.0–0.2)

## 2022-03-21 LAB — CK: Total CK: 164 U/L (ref 38–234)

## 2022-03-21 LAB — PROTIME-INR
INR: 1.2 (ref 0.8–1.2)
Prothrombin Time: 15.2 seconds (ref 11.4–15.2)

## 2022-03-21 LAB — IRON AND TIBC
Iron: 44 ug/dL (ref 28–170)
Saturation Ratios: 19 % (ref 10.4–31.8)
TIBC: 238 ug/dL — ABNORMAL LOW (ref 250–450)
UIBC: 194 ug/dL

## 2022-03-21 LAB — SARS CORONAVIRUS 2 BY RT PCR: SARS Coronavirus 2 by RT PCR: NEGATIVE

## 2022-03-21 MED ORDER — SENNA 8.6 MG PO TABS
2.0000 | ORAL_TABLET | Freq: Every day | ORAL | Status: DC
  Administered 2022-03-21 – 2022-03-23 (×3): 17.2 mg via ORAL
  Filled 2022-03-21 (×4): qty 2

## 2022-03-21 MED ORDER — LIDOCAINE 5 % EX PTCH
1.0000 | MEDICATED_PATCH | CUTANEOUS | Status: DC
  Administered 2022-03-21 – 2022-03-23 (×2): 1 via TRANSDERMAL
  Filled 2022-03-21 (×2): qty 1

## 2022-03-21 MED ORDER — ONDANSETRON HCL 4 MG/2ML IJ SOLN: 4.0000 mg | Freq: Four times a day (QID) | INTRAMUSCULAR | Status: DC | PRN

## 2022-03-21 MED ORDER — ENSURE ENLIVE PO LIQD
237.0000 mL | Freq: Two times a day (BID) | ORAL | Status: DC
  Administered 2022-03-21 – 2022-03-24 (×6): 237 mL via ORAL
  Filled 2022-03-21 (×2): qty 237

## 2022-03-21 MED ORDER — POLYETHYLENE GLYCOL 3350 17 G PO PACK: 17.0000 g | PACK | Freq: Every day | ORAL | Status: DC

## 2022-03-21 MED ORDER — DARBEPOETIN ALFA 100 MCG/0.5ML IJ SOSY
100.0000 ug | PREFILLED_SYRINGE | Freq: Once | INTRAMUSCULAR | Status: AC
  Administered 2022-03-21: 100 ug via SUBCUTANEOUS
  Filled 2022-03-21: qty 0.5

## 2022-03-21 MED ORDER — FLUTICASONE PROPIONATE 50 MCG/ACT NA SUSP: 1.0000 | Freq: Every evening | NASAL | Status: DC | PRN

## 2022-03-21 MED ORDER — SODIUM CHLORIDE 0.9 % IV SOLN
1.0000 g | INTRAVENOUS | Status: DC
  Administered 2022-03-22: 1 g via INTRAVENOUS
  Filled 2022-03-21 (×2): qty 10

## 2022-03-21 MED ORDER — ONDANSETRON HCL 4 MG PO TABS: 4.0000 mg | ORAL_TABLET | Freq: Four times a day (QID) | ORAL | Status: DC | PRN

## 2022-03-21 MED ORDER — SODIUM CHLORIDE 0.9 % IV SOLN
1.0000 g | Freq: Once | INTRAVENOUS | Status: AC
  Administered 2022-03-21: 1 g via INTRAVENOUS
  Filled 2022-03-21: qty 10

## 2022-03-21 MED ORDER — DICLOFENAC SODIUM 25 MG PO TBEC: 25.0000 mg | DELAYED_RELEASE_TABLET | Freq: Two times a day (BID) | ORAL | Status: DC | PRN

## 2022-03-21 MED ORDER — ADULT MULTIVITAMIN W/MINERALS CH
1.0000 | ORAL_TABLET | Freq: Every day | ORAL | Status: DC
  Administered 2022-03-21 – 2022-03-24 (×4): 1 via ORAL
  Filled 2022-03-21 (×4): qty 1

## 2022-03-21 NOTE — ED Triage Notes (Signed)
Pt BIB EMS due to a fall. Pt has been falling more freq the past 3 days. Pt was on the floor for 10 hours yesterday and family found her today. Pt is active hospice pt for ovarian cancer. Pt is not on any chemo/ radiation. Pt denies LOC, no blood thinners. Pt complains of bilateral leg pain. Pt is axox4. VSS.

## 2022-03-21 NOTE — Progress Notes (Signed)
Patient states her family was with her in the ED and has left to go home and get rest. She states they are aware she is present.

## 2022-03-21 NOTE — ED Provider Notes (Signed)
[facility] EMERGENCY DEPARTMENT Provider Note   CSN: [id number] Arrival date & time: 03/21/22  0900     History  Chief Complaint  Patient presents with   Fall    Mia Simpson is a 88 y.o. female.  HPI  88 yo female with report of several recent falls, found on ground today and thought to have been on floor for 10 hours.  Patient is on hospice care for ovarian cancer.  She thinks she may have hit head her head or not or whether or not there was a loc.      Home Medications Prior to Admission medications   Medication Sig Start Date End Date Taking? Authorizing Provider  diclofenac (VOLTAREN) 25 MG EC tablet TAKE 1 TABLET BY MOUTH TWICE A DAY AS NEEDED 03/10/22   [name], [name], MD  fluticasone [facility]) 50 MCG/ACT nasal spray Place 1 spray into both nostrils at bedtime as needed for allergies or rhinitis. 05/05/20   [name], [name], MD  furosemide (LASIX) 20 MG tablet Take 0.5-1 tablets (10-20 mg total) by mouth daily as needed. 01/18/22   [name], [name], MD  lidocaine (LIDODERM) 5 % Place 1 patch onto the skin daily. Remove & Discard patch within 12 hours or as directed by MD 12/23/21   Scot Jun, FNP  Multiple Vitamins-Minerals (MULTIPLE VITAMINS/WOMENS PO) Take 1 tablet by mouth daily.     [provider]  Omega-3 Fatty Acids (FISH OIL) 1200 MG CAPS Take 2,400 mg by mouth daily.    [provider]  polyethylene glycol (MIRALAX / GLYCOLAX) 17 g packet Take 17 g by mouth daily.    [provider]  Trospium Chloride 60 MG CP24 TAKE 1 CAPSULE BY MOUTH EVERY DAY 07/28/19   McKenzie, Candee Furbish, MD      Allergies    Patient has no known allergies.    Review of Systems   Review of Systems  Physical Exam Updated Vital Signs BP (!) 141/76 (BP Location: Right Arm)   Pulse 87   Temp (!) 96.2 F (35.7 C) (Rectal)   Resp 20   SpO2 99%  Physical Exam Vitals (no vs seen on chart at this time) and nursing note reviewed.   Constitutional:      General: She is not in acute distress.    Appearance: Normal appearance.  HENT:     Head: Normocephalic and atraumatic.     Right Ear: External ear normal.     Left Ear: External ear normal.     Nose: Nose normal.     Mouth/Throat:     Pharynx: Oropharynx is clear.  Eyes:     Extraocular Movements: Extraocular movements intact.     Pupils: Pupils are equal, round, and reactive to light.  Cardiovascular:     Rate and Rhythm: Normal rate and regular rhythm.  Pulmonary:     Effort: Pulmonary effort is normal.     Breath sounds: Normal breath sounds.  Abdominal:     General: Bowel sounds are normal.     Palpations: Abdomen is soft.  Musculoskeletal:        General: Normal range of motion.     Cervical back: Normal range of motion.     Comments: No trauma to back, no ttp over cervical, thoracic or lumbar spine  Skin:    General: Skin is warm.     Capillary Refill: Capillary refill takes less than 2 seconds.     Comments: Some early  skin breakdown perirectal area  Neurological:     General: No focal deficit present.     Mental Status: She is alert.     Cranial Nerves: No cranial nerve deficit.     Motor: No weakness.     Comments: Pain with movement of bilateral knees hips but strength appears good without lateralized weakness  Psychiatric:        Mood and Affect: Mood normal.     ED Results / Procedures / Treatments   Labs (all labs ordered are listed, but only abnormal results are displayed) Labs Reviewed  CBC - Abnormal; Notable for the following components:      Result Value   WBC 13.8 (*)    RBC 2.71 (*)    Hemoglobin 7.4 (*)    HCT 24.5 (*)    RDW 16.7 (*)    Platelets 445 (*)    All other components within normal limits  COMPREHENSIVE METABOLIC PANEL - Abnormal; Notable for the following components:   CO2 21 (*)    Glucose, Bld 103 (*)    BUN 30 (*)    Creatinine, Ser 1.02 (*)    Calcium 8.8 (*)    Total Protein 6.4 (*)    Albumin  2.3 (*)    GFR, Estimated 52 (*)    All other components within normal limits  URINALYSIS, ROUTINE W REFLEX MICROSCOPIC - Abnormal; Notable for the following components:   Color, Urine AMBER (*)    APPearance CLOUDY (*)    Hgb urine dipstick MODERATE (*)    Protein, ur >=300 (*)    Leukocytes,Ua LARGE (*)    RBC / HPF >50 (*)    WBC, UA >50 (*)    Bacteria, UA MANY (*)    Non Squamous Epithelial 0-5 (*)    All other components within normal limits  SARS CORONAVIRUS 2 BY RT PCR  PROTIME-INR  CK  IRON AND TIBC  FERRITIN  RETICULOCYTES  POC OCCULT BLOOD, ED    EKG None  Radiology CT Head Wo Contrast  Result Date: 03/21/2022 CLINICAL DATA:  Status post fall.  Found down. EXAM: CT HEAD WITHOUT CONTRAST CT CERVICAL SPINE WITHOUT CONTRAST TECHNIQUE: Multidetector CT imaging of the head and cervical spine was performed following the standard protocol without intravenous contrast. Multiplanar CT image reconstructions of the cervical spine were also generated. RADIATION DOSE REDUCTION: This exam was performed according to the departmental dose-optimization program which includes automated exposure control, adjustment of the mA and/or kV according to patient size and/or use of iterative reconstruction technique. COMPARISON:  None. FINDINGS: CT HEAD FINDINGS Brain: There is no evidence for acute hemorrhage, hydrocephalus, mass lesion, or abnormal extra-axial fluid collection. No definite CT evidence for acute infarction. Patchy low attenuation in the deep hemispheric and periventricular white matter is nonspecific, but likely reflects chronic microvascular ischemic demyelination. Vascular: No hyperdense vessel or unexpected calcification. Skull: No evidence for fracture. No worrisome lytic or sclerotic lesion. Sinuses/Orbits: The visualized paranasal sinuses and mastoid air cells are clear. Visualized portions of the globes and intraorbital fat are unremarkable. Other: None. CT CERVICAL SPINE  FINDINGS Alignment: Normal. Skull base and vertebrae: No acute fracture. No primary bone lesion or focal pathologic process. Soft tissues and spinal canal: No prevertebral fluid or swelling. No visible canal hematoma. Disc levels: Diffuse loss of intervertebral disc height noted in the cervical spine without evidence for traumatic subluxation. Right-sided C2-3 and C4-5 facets are fused. Left-sided facets are fused at C2-3, C3-4, C4-5. Upper  chest: Unremarkable. Other: None. IMPRESSION: 1. No acute intracranial abnormality. 2. Chronic small vessel ischemic disease. 3. No evidence for cervical spine fracture or traumatic subluxation. 4. Diffuse degenerative changes in the cervical spine as above. Electronically Signed   By: Misty Stanley M.D.   On: 03/21/2022 11:13   CT Cervical Spine Wo Contrast  Result Date: 03/21/2022 CLINICAL DATA:  Status post fall.  Found down. EXAM: CT HEAD WITHOUT CONTRAST CT CERVICAL SPINE WITHOUT CONTRAST TECHNIQUE: Multidetector CT imaging of the head and cervical spine was performed following the standard protocol without intravenous contrast. Multiplanar CT image reconstructions of the cervical spine were also generated. RADIATION DOSE REDUCTION: This exam was performed according to the departmental dose-optimization program which includes automated exposure control, adjustment of the mA and/or kV according to patient size and/or use of iterative reconstruction technique. COMPARISON:  None. FINDINGS: CT HEAD FINDINGS Brain: There is no evidence for acute hemorrhage, hydrocephalus, mass lesion, or abnormal extra-axial fluid collection. No definite CT evidence for acute infarction. Patchy low attenuation in the deep hemispheric and periventricular white matter is nonspecific, but likely reflects chronic microvascular ischemic demyelination. Vascular: No hyperdense vessel or unexpected calcification. Skull: No evidence for fracture. No worrisome lytic or sclerotic lesion.  Sinuses/Orbits: The visualized paranasal sinuses and mastoid air cells are clear. Visualized portions of the globes and intraorbital fat are unremarkable. Other: None. CT CERVICAL SPINE FINDINGS Alignment: Normal. Skull base and vertebrae: No acute fracture. No primary bone lesion or focal pathologic process. Soft tissues and spinal canal: No prevertebral fluid or swelling. No visible canal hematoma. Disc levels: Diffuse loss of intervertebral disc height noted in the cervical spine without evidence for traumatic subluxation. Right-sided C2-3 and C4-5 facets are fused. Left-sided facets are fused at C2-3, C3-4, C4-5. Upper chest: Unremarkable. Other: None. IMPRESSION: 1. No acute intracranial abnormality. 2. Chronic small vessel ischemic disease. 3. No evidence for cervical spine fracture or traumatic subluxation. 4. Diffuse degenerative changes in the cervical spine as above. Electronically Signed   By: Misty Stanley M.D.   On: 03/21/2022 11:13   DG Hip Unilat W or Wo Pelvis 2-3 Views Left  Result Date: 03/21/2022 CLINICAL DATA:  88 year old female status post fall 3 days ago, found down. Altered mental status and pain. EXAM: DG HIP (WITH OR WITHOUT PELVIS) 2-3V LEFT COMPARISON:  Right hip series today reported separately. FINDINGS: AP pelvis and left hip series at 1044 hours. Femoral heads are normally located. Bone mineralization is within normal limits for age. No pelvis fracture identified. Left lower quadrant, left pelvic surgical clips. Negative visible bowel gas. Hip joint spaces appear symmetric and fairly normal for age. Bilateral acetabular degenerative spurring. Proximal right femur is detailed separately. Proximal left femur appears intact. IMPRESSION: 1. No acute fracture or dislocation identified about the left hip or pelvis. 2. Right hip reported separately. Electronically Signed   By: Genevie Ann M.D.   On: 03/21/2022 11:09   DG Chest 1 View  Result Date: 03/21/2022 CLINICAL DATA:  88 year old  female status post fall 3 days ago, found down. Altered mental status and pain. EXAM: CHEST  1 VIEW COMPARISON:  Chest radiograph 12/23/2021 and earlier. FINDINGS: Portable supine view at 1054 hours. Mildly lower lung volumes. Stable mild tortuosity of the thoracic aorta. Other mediastinal contours are within normal limits. Visualized tracheal air column is within normal limits. Allowing for portable technique the lungs are clear. No pneumothorax or pleural effusion identified on this supine view. Stable visualized osseous structures. Negative visible  bowel gas. IMPRESSION: No acute cardiopulmonary abnormality or acute traumatic injury identified. Electronically Signed   By: Genevie Ann M.D.   On: 03/21/2022 11:06   DG Knee Complete 4 Views Left  Result Date: 03/21/2022 CLINICAL DATA:  88 year old female status post fall 3 days ago, found down. Altered mental status and pain. EXAM: LEFT KNEE - COMPLETE 4+ VIEW COMPARISON:  Left tib fib series 12/31/2016. FINDINGS: Bulky chronic tricompartmental left knee joint degeneration. Bulky osteophytosis. No definite joint effusion. Patella appears intact. No acute osseous abnormality identified. Some generalized soft tissue swelling. IMPRESSION: Bulky chronic tricompartmental left knee joint degeneration. No acute fracture or dislocation identified. Electronically Signed   By: Genevie Ann M.D.   On: 03/21/2022 11:05   DG Knee Complete 4 Views Right  Result Date: 03/21/2022 CLINICAL DATA:  88 year old female status post fall 3 days ago, found down. Altered mental status and pain. EXAM: RIGHT KNEE - COMPLETE 4+ VIEW COMPARISON:  None Available. FINDINGS: Right total knee arthroplasty. Hardware appears intact and aligned. No acute osseous abnormality identified. Patella appears intact with some postoperative and degenerative changes. No definite joint effusion on the cross-table lateral view. IMPRESSION: Right total knee arthroplasty with no acute fracture or dislocation  identified about the right knee. Electronically Signed   By: Genevie Ann M.D.   On: 03/21/2022 11:04   DG Shoulder Left  Result Date: 03/21/2022 CLINICAL DATA:  88 year old female status post fall 3 days ago, found down. Altered mental status and pain. EXAM: LEFT SHOULDER - 2+ VIEW COMPARISON:  Contralateral left shoulder today. Left shoulder series 12/23/2021. FINDINGS: Very severe chronic left glenohumeral joint space loss with bulky osteophytosis and calcified loose bodies around the joint space. No glenohumeral joint dislocation. Proximal left humerus appears stable and intact. Less pronounced moderate left AC joint degenerative spurring. Left clavicle and scapula appears stable and intact. Visible left chest and ribs appears stable and intact. IMPRESSION: Chronic very severe degeneration with no acute fracture or dislocation identified about the left shoulder. Electronically Signed   By: Genevie Ann M.D.   On: 03/21/2022 10:58   DG Shoulder Right  Result Date: 03/21/2022 CLINICAL DATA:  88 year old female status post fall 3 days ago, found down. Altered mental status and pain. EXAM: RIGHT SHOULDER - 2+ VIEW COMPARISON:  Chest radiographs 12/23/2021. FINDINGS: Severe glenohumeral joint space loss with degenerative spurring. No glenohumeral joint dislocation. Proximal right humerus appears intact with mild degenerative calcification near the insertion of the rotator cuff. Moderate to severe AC joint space loss with degenerative spurring. Right clavicle and scapula appear intact. Visible right ribs and chest appear stable, negative. IMPRESSION: Advanced degenerative changes at the right shoulder but no acute fracture or dislocation identified. Electronically Signed   By: Genevie Ann M.D.   On: 03/21/2022 10:57   DG Hip Unilat W or Wo Pelvis 2-3 Views Right  Result Date: 03/21/2022 CLINICAL DATA:  [place] 3 days ago, on floor 10 hours, altered mental status, pain EXAM: DG HIP (WITH OR WITHOUT PELVIS) 2-3V RIGHT  COMPARISON:  None Available. FINDINGS: Osseous mineralization normal. RIGHT hip and SI joint spaces preserved. No acute fracture, dislocation, or bone destruction. IMPRESSION: No acute abnormalities. Electronically Signed   By: Lavonia Dana M.D.   On: 03/21/2022 10:56    Procedures .Critical Care  Performed by: Pattricia Boss, MD Authorized by: Pattricia Boss, MD   Critical care provider statement:    Critical care time (minutes):  30   Critical care was necessary to treat  or prevent imminent or life-threatening deterioration of the following conditions:  Trauma   Critical care was time spent personally by me on the following activities:  Development of treatment plan with patient or surrogate, discussions with consultants, evaluation of patient's response to treatment, examination of patient, ordering and review of laboratory studies, ordering and review of radiographic studies, ordering and performing treatments and interventions, pulse oximetry, re-evaluation of patient's condition and review of old charts     Medications Ordered in ED Medications  cefTRIAXone (ROCEPHIN) 1 g in sodium chloride 0.9 % 100 mL IVPB (has no administration in time range)    ED Course/ Medical Decision Making/ A&P Clinical Course as of 03/21/22 1242  Tue Mar 21, 2022  1233 CT head reviewed interpreted no acute abnormality radiologist interpretation is no acute intracranial abnormality and no evidence for cervical spine fracture or traumatic subluxation [DR]  1234 Hip x-Jerelyn Trimarco reviewed interpreted and no evidence of acute fracture or dislocation noted on left hip x-Reginold Beale [DR]  1234 Chest x-Kamila Broda reviewed interpreted no evidence of acute cardiopulmonary abnormality and radiologist interpretation concurs [DR]  0762 Right knee x-Brendolyn Stockley reviewed interpreted no evidence of acute abnormality noted radiologist interpretation concurs [DR]  2633 Left knee x-Ankush Gintz reviewed and interpreted and no evidence of acute fracture dislocation  noted and radiologist interpretation concurs [DR]  3545 Bilateral shoulder x-rays reviewed interpreted no evidence of acute fracture noted radiologist interpreted regards [DR]  94 Right hip x-Ether Wolters reviewed interpreted no evidence of acute abnormality is noted radiologist rotation concurs [DR]    Clinical Course User Index [DR] Pattricia Boss, MD                           Medical Decision Making 88 year old female with gynecological cancer who is on palliative care presents today with multiple recent falls presents today after being found on floor. Patient has some cognitive decline is difficult to tell from her history exactly what happened She does not appear to have any focal neurological deficits Patient evaluated here with imaging, labs, urinalysis No obvious injuries from the fall and multiple imaging obtained including head CT, neck CT, bilateral hips, bilateral knees, bilateral shoulders and no evidence of acute fracture noted Labs are significant for hemoglobin at 7.4 which could certainly be contributing to her falling.  I would suspect that she is having bleeding causing the anemia, and likely is getting orthostatic.  No evidence of bleeding as a result of her falls on my exam Additionally patient has very strong smell of urine about here and urinalysis is consistent with UTI. I have discussed patient wishes with the patient, daughter-in-law, and son.  They wish to have treatment and would consider blood transfusion if indicated IV Rocephin ordered Patient has remained hemodynamically stable Care discussed with Dr. Roosevelt Locks, on for hospitalist who will see for admission   Amount and/or Complexity of Data Reviewed Labs: ordered. Radiology: ordered.           Final Clinical Impression(s) / ED Diagnoses Final diagnoses:  Anemia, unspecified type  Fall, initial encounter  Urinary tract infection with hematuria, site unspecified    Rx / DC Orders ED Discharge Orders      None         Pattricia Boss, MD 03/21/22 1242

## 2022-03-21 NOTE — Progress Notes (Signed)
Patient has arrived to the floor  

## 2022-03-21 NOTE — Progress Notes (Signed)
Unable to complete admission due to patient being a poor historian with intermittent confusion on arrival. Patient thinks she is at her home address, reoriented patient. No family/friends present.

## 2022-03-21 NOTE — Progress Notes (Signed)
[facility] Hospital Liaison Note   This is a current [facility] hospice patient. Please call with any hospice questions or concerns. Thank you.   Sarah L. Martin, RN [facility] Hospital Liaison [phone] 
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
## 2022-03-22 DIAGNOSIS — C52 Malignant neoplasm of vagina: Secondary | ICD-10-CM | POA: Diagnosis not present

## 2022-03-22 DIAGNOSIS — R531 Weakness: Secondary | ICD-10-CM | POA: Diagnosis present

## 2022-03-22 DIAGNOSIS — Z823 Family history of stroke: Secondary | ICD-10-CM | POA: Diagnosis not present

## 2022-03-22 DIAGNOSIS — F0150 Vascular dementia without behavioral disturbance: Secondary | ICD-10-CM | POA: Diagnosis not present

## 2022-03-22 DIAGNOSIS — N390 Urinary tract infection, site not specified: Secondary | ICD-10-CM | POA: Diagnosis not present

## 2022-03-22 DIAGNOSIS — Z833 Family history of diabetes mellitus: Secondary | ICD-10-CM | POA: Diagnosis not present

## 2022-03-22 DIAGNOSIS — E785 Hyperlipidemia, unspecified: Secondary | ICD-10-CM | POA: Diagnosis present

## 2022-03-22 DIAGNOSIS — D500 Iron deficiency anemia secondary to blood loss (chronic): Secondary | ICD-10-CM | POA: Diagnosis present

## 2022-03-22 DIAGNOSIS — M1990 Unspecified osteoarthritis, unspecified site: Secondary | ICD-10-CM | POA: Diagnosis not present

## 2022-03-22 DIAGNOSIS — Z96651 Presence of right artificial knee joint: Secondary | ICD-10-CM | POA: Diagnosis present

## 2022-03-22 DIAGNOSIS — D649 Anemia, unspecified: Secondary | ICD-10-CM | POA: Diagnosis present

## 2022-03-22 DIAGNOSIS — I10 Essential (primary) hypertension: Secondary | ICD-10-CM | POA: Diagnosis not present

## 2022-03-22 DIAGNOSIS — Z8249 Family history of ischemic heart disease and other diseases of the circulatory system: Secondary | ICD-10-CM | POA: Diagnosis not present

## 2022-03-22 DIAGNOSIS — Z8543 Personal history of malignant neoplasm of ovary: Secondary | ICD-10-CM | POA: Diagnosis not present

## 2022-03-22 DIAGNOSIS — Z923 Personal history of irradiation: Secondary | ICD-10-CM | POA: Diagnosis not present

## 2022-03-22 DIAGNOSIS — I951 Orthostatic hypotension: Secondary | ICD-10-CM | POA: Diagnosis present

## 2022-03-22 DIAGNOSIS — E8809 Other disorders of plasma-protein metabolism, not elsewhere classified: Secondary | ICD-10-CM | POA: Diagnosis present

## 2022-03-22 DIAGNOSIS — Z1152 Encounter for screening for COVID-19: Secondary | ICD-10-CM | POA: Diagnosis not present

## 2022-03-22 DIAGNOSIS — I700 Atherosclerosis of aorta: Secondary | ICD-10-CM | POA: Diagnosis not present

## 2022-03-22 DIAGNOSIS — Z79899 Other long term (current) drug therapy: Secondary | ICD-10-CM | POA: Diagnosis not present

## 2022-03-22 DIAGNOSIS — Z66 Do not resuscitate: Secondary | ICD-10-CM | POA: Diagnosis present

## 2022-03-22 DIAGNOSIS — Z803 Family history of malignant neoplasm of breast: Secondary | ICD-10-CM | POA: Diagnosis not present

## 2022-03-22 DIAGNOSIS — I70209 Unspecified atherosclerosis of native arteries of extremities, unspecified extremity: Secondary | ICD-10-CM | POA: Diagnosis not present

## 2022-03-22 DIAGNOSIS — Z825 Family history of asthma and other chronic lower respiratory diseases: Secondary | ICD-10-CM | POA: Diagnosis not present

## 2022-03-22 DIAGNOSIS — R296 Repeated falls: Secondary | ICD-10-CM | POA: Diagnosis present

## 2022-03-22 DIAGNOSIS — Z9221 Personal history of antineoplastic chemotherapy: Secondary | ICD-10-CM | POA: Diagnosis not present

## 2022-03-22 DIAGNOSIS — R319 Hematuria, unspecified: Secondary | ICD-10-CM

## 2022-03-22 DIAGNOSIS — L89152 Pressure ulcer of sacral region, stage 2: Secondary | ICD-10-CM | POA: Diagnosis present

## 2022-03-22 LAB — BASIC METABOLIC PANEL
Anion gap: 6 (ref 5–15)
BUN: 17 mg/dL (ref 8–23)
CO2: 22 mmol/L (ref 22–32)
Calcium: 8.4 mg/dL — ABNORMAL LOW (ref 8.9–10.3)
Chloride: 111 mmol/L (ref 98–111)
Creatinine, Ser: 0.86 mg/dL (ref 0.44–1.00)
GFR, Estimated: >60 mL/min (ref >60)
Glucose, Bld: 82 mg/dL (ref 70–99)
Potassium: 3.6 mmol/L (ref 3.5–5.1)
Sodium: 139 mmol/L (ref 135–145)

## 2022-03-22 LAB — CBC
HCT: 19.1 % — ABNORMAL LOW (ref 36.0–46.0)
Hemoglobin: 6.1 g/dL — CL (ref 12.0–15.0)
MCH: 27.1 pg (ref 26.0–34.0)
MCHC: 31.9 g/dL (ref 30.0–36.0)
MCV: 84.9 fL (ref 80.0–100.0)
Platelets: 365 10*3/uL (ref 150–400)
RBC: 2.25 MIL/uL — ABNORMAL LOW (ref 3.87–5.11)
RDW: 16.1 % — ABNORMAL HIGH (ref 11.5–15.5)
WBC: 10.6 10*3/uL — ABNORMAL HIGH (ref 4.0–10.5)
nRBC: 0 % (ref 0.0–0.2)

## 2022-03-22 LAB — PREPARE RBC (CROSSMATCH)

## 2022-03-22 MED ORDER — SODIUM CHLORIDE 0.9% IV SOLUTION: Freq: Once | INTRAVENOUS | Status: AC

## 2022-03-22 NOTE — Progress Notes (Signed)
Date and time results received: 03/22/22 0803  Test: hemoglobin Critical Value: 6.1  Name of Provider Notified:Gokul Maryland Pink, MD  Orders Received? Transfuse 2 units of packed RBCs

## 2022-03-22 NOTE — Progress Notes (Signed)
[facility]) Hospital Liaison Note    Mrs.Lenart is a current hospice patient with a terminal diagnosis of Carcinoma of the vagina. Patient brought into the ED by her daughter, [name], after a fall.  [facility]  contacted hospice to notify of patient being in the hospital. Mrs. Marcello was admitted to [facility] in the afternoon on 10.24 for UTI and low hemoglobin. Per Dr.Feldmann, ACC MD, this is a related hospital admission.    Visited Mrs. Carriero at bedside. She was resting and I did not wake her. Report exchanged with RN. Patient will recive 2 units of blood and continues on IV antibiotics for UTI. Spoke with patient's daughter  via telephone to provide update.    Patient is inpatient appropriate due to requiring IV antibiotic and blood transfusion.     V/S:  102/54 bbp,  98.7 Temp, 98 bpm, 17 RR, 98% on RA  I&O: 240/not recorded   Labs:   CO2: 21 (L) Glucose: 103 (H) BUN: 30 (H) Creatinine: 1.02 (H) Calcium: 8.8 (L) Anion gap: 12 Albumin: 2.3 (L) Total Protein: 6.4 (L) GFR, Estimated: 52 (L) TIBC: 238 (L) Ferritin: 315 (H) WBC: 13.8 (H) RBC: 2.71 (L) Hemoglobin: 7.4 (L) HCT: 24.5 (L)  RDW: 16.7 (H) Platelets: 445 (H) RBC.: 2.71 (L) Immature Retic Fract: 24.5 (H)   Diagnostics:  Xray of the hips, knees, and shoulders performed yesterday showed negative for fracture. CT of spine was also negative.    IV/PRN:  cefTRIAXone (ROCEPHIN) 1 g in sodium chloride 0.9 % 100 mL IVPB Dose: 1 g Freq:  Once Route: IV x1  0.9 % sodium chloride infusion (Manually program via Guardrails IV Fluids) Freq:  Once Route: IV x1  Problem list: Assessment/Plan:   Acute on chronic microcytic anemia/chronic blood loss anemia Patient has intermittent bleeding due to her vaginal cancer.  Hemoglobin noted to be 6.1 this morning.  She will be transfused 2 units of PRBC.  No active bleeding reported by nursing staff.  Continue to monitor. Anemia panel did not suggest iron  deficiency.   Orthostatic hypotension Secondary to anemia.  PT evaluation.   Urinary tract infection Continue ceftriaxone.  Follow-up on cultures.  WBC was noted to be elevated and improved this morning.  She is afebrile.   Recurrent vaginal cancer Currently off of chemotherapy and radiation.  Under hospice care at home.   Frequent falls Likely due to weakness from anemia and perhaps also secondary to urinary tract infection. PT and OT evaluation.  GOC: Patient is currently a DNR.   D/C planning: Ongoing  Family: Spoke with patient's daughter via telephone to provide update.  IDT: Updated    Should patient need ambulance transfer - Please use [facility]) as they contract this service for our active hospice patients.        Zigmund Gottron, RN [facility] Liaison  [phone]

## 2022-03-22 NOTE — Progress Notes (Signed)
[facility] PROGRESS NOTE   Jazzmen Restivo WVP:[id number] DOB: [birth year] DOA: 03/21/2022  PCP: [name], [name], MD  Brief History/Interval Summary: 88 y.o. female with medical history significant of vaginal cancer status post chemo and radiation therapy, stopped in 2022 and has been on active hospice at home, multiple OA, orthostatic hypotension and frequent falls, presented with frequent falls. Appears that the patient has had frequent episodes of feeling of lightheadedness at least since July 2023, has been evaluated by PCP in August, office visit showed blood pressure 98/60.  Recently, increasingly, patient has had frequent episodes of increasing lightheadedness and fall at home.  She also reported that she has had burning sensation with urination and increasing night urination, denies any abdominal pain fever or chills.  Patient was diagnosed with vaginal cancer about 2 years ago completed chemo and radiation therapy however was found to have recurrent cancer and or active treatment discontinued in 2022 and patient was placed home hospice.  Family report patient occasionally has blood spots and clot in her underwear but no normal large quantity of blood or clots.   Consultants: None  Procedures: Blood transfusion    Subjective/Interval History: Nurses informed the patient was found to have some clots in her diaper overnight but none since then.  Patient denies any active bleeding currently.  Denies any abdominal pain nausea or vomiting.     Assessment/Plan:  Acute on chronic microcytic anemia/chronic blood loss anemia Patient has intermittent bleeding due to her vaginal cancer.  Hemoglobin noted to be 6.1 this morning.  She will be transfused 2 units of PRBC.  No active bleeding reported by nursing staff.  Continue to monitor. Anemia panel did not suggest iron deficiency.  Orthostatic hypotension Secondary to anemia.  PT evaluation.  Urinary tract infection Continue  ceftriaxone.  Follow-up on cultures.  WBC was noted to be elevated and improved this morning.  She is afebrile.  Recurrent vaginal cancer Currently off of chemotherapy and radiation.  Under hospice care at home.  Frequent falls Likely due to weakness from anemia and perhaps also secondary to urinary tract infection. PT and OT evaluation.  DVT Prophylaxis: SCDs Code Status: DNR Family Communication: No family at bedside Disposition Plan: Hopefully return home when stabilized  Status is: Observation The patient will require care spanning > 2 midnights and should be moved to inpatient because: Anemia requiring transfusion      Medications: Scheduled:  sodium chloride   Intravenous Once   feeding supplement  237 mL Oral BID BM   lidocaine  1 patch Transdermal Q24H   multivitamin with minerals  1 tablet Oral Daily   senna  2 tablet Oral Daily   Continuous:  cefTRIAXone (ROCEPHIN)  IV     NIO:[id number], fluticasone, ondansetron **OR** ondansetron (ZOFRAN) IV  Antibiotics: Anti-infectives (From admission, onward)    Start     Dose/Rate Route Frequency Ordered Stop   03/22/22 1000  cefTRIAXone (ROCEPHIN) 1 g in sodium chloride 0.9 % 100 mL IVPB        1 g 200 mL/hr over 30 Minutes Intravenous Every 24 hours 03/21/22 1258     03/21/22 1245  cefTRIAXone (ROCEPHIN) 1 g in sodium chloride 0.9 % 100 mL IVPB        1 g 200 mL/hr over 30 Minutes Intravenous  Once 03/21/22 1233 03/21/22 1344       Objective:  Vital Signs  Vitals:   03/21/22 1557 03/21/22 1926 03/22/22 0411 03/22/22 0730  BP: (!) 129/58 139/75  135/70 (!) 102/54  Pulse: 88 99 97 98  Resp: '15 17 16 17  '$ Temp: (!) 97.5 F (36.4 C) 98.7 F (37.1 C) 98.5 F (36.9 C) 98.7 F (37.1 C)  TempSrc: Oral Oral Oral Oral  SpO2: 100% 100% 100% 98%    Intake/Output Summary (Last 24 hours) at 03/22/2022 1001 Last data filed at 03/21/2022 1930 Gross per 24 hour  Intake 240 ml  Output --  Net 240 ml   There were  no vitals filed for this visit.  General appearance: Awake alert.  In no distress Resp: Clear to auscultation bilaterally.  Normal effort Cardio: S1-S2 is normal regular.  No S3-S4.  No rubs murmurs or bruit GI: Abdomen is soft.  Nontender nondistended.  Bowel sounds are present normal.  No masses organomegaly Extremities: No edema.  Physical deconditioning noted.  Moving all of her extremities Neurologic: No focal neurological deficits.    Lab Results:  Data Reviewed: I have personally reviewed following labs and reports of the imaging studies  CBC: Recent Labs  Lab 03/21/22 0935 03/22/22 0648  WBC 13.8* 10.6*  HGB 7.4* 6.1*  HCT 24.5* 19.1*  MCV 90.4 84.9  PLT 445* 540    Basic Metabolic Panel: Recent Labs  Lab 03/21/22 0935 03/22/22 0648  NA 141 139  K 4.6 3.6  CL 108 111  CO2 21* 22  GLUCOSE 103* 82  BUN 30* 17  CREATININE 1.02* 0.86  CALCIUM 8.8* 8.4*    GFR: CrCl cannot be calculated (Unknown ideal weight.).  Liver Function Tests: Recent Labs  Lab 03/21/22 0935  AST 27  ALT 16  ALKPHOS 55  BILITOT 0.6  PROT 6.4*  ALBUMIN 2.3*    Coagulation Profile: Recent Labs  Lab 03/21/22 0935  INR 1.2    Cardiac Enzymes: Recent Labs  Lab 03/21/22 0935  CKTOTAL 164     Anemia Panel: Recent Labs    03/21/22 0935  FERRITIN 315*  TIBC 238*  IRON 44  RETICCTPCT 1.9    Recent Results (from the past 240 hour(s))  SARS Coronavirus 2 by RT PCR (hospital order, performed in [facility] hospital lab) *cepheid single result test* Anterior Nasal Swab     Status: None   Collection Time: 03/21/22  9:54 AM   Specimen: Anterior Nasal Swab  Result Value Ref Range Status   SARS Coronavirus 2 by RT PCR NEGATIVE NEGATIVE Final    Comment: (NOTE) SARS-CoV-2 target nucleic acids are NOT DETECTED.  The SARS-CoV-2 RNA is generally detectable in upper and lower respiratory specimens during the acute phase of infection. The lowest concentration of SARS-CoV-2  viral copies this assay can detect is 250 copies / mL. A negative result does not preclude SARS-CoV-2 infection and should not be used as the sole basis for treatment or other patient management decisions.  A negative result may occur with improper specimen collection / handling, submission of specimen other than nasopharyngeal swab, presence of viral mutation(s) within the areas targeted by this assay, and inadequate number of viral copies (<250 copies / mL). A negative result must be combined with clinical observations, patient history, and epidemiological information.  Fact Sheet for Patients:   https://www.patel.info/  Fact Sheet for Healthcare Providers: https://hall.com/  This test is not yet approved or  cleared by the [place] FDA and has been authorized for detection and/or diagnosis of SARS-CoV-2 by FDA under an Emergency Use Authorization (EUA).  This EUA will remain in effect (meaning this test can be used) for  the duration of the COVID-19 declaration under Section 564(b)(1) of the Act, 21 U.S.C. section 360bbb-3(b)(1), unless the authorization is terminated or revoked sooner.  Performed at [facility], [place] [address], [place], [place] [address]       Radiology Studies: CT Head Wo Contrast  Result Date: 03/21/2022 CLINICAL DATA:  Status post fall.  Found down. EXAM: CT HEAD WITHOUT CONTRAST CT CERVICAL SPINE WITHOUT CONTRAST TECHNIQUE: Multidetector CT imaging of the head and cervical spine was performed following the standard protocol without intravenous contrast. Multiplanar CT image reconstructions of the cervical spine were also generated. RADIATION DOSE REDUCTION: This exam was performed according to the departmental dose-optimization program which includes automated exposure control, adjustment of the mA and/or kV according to patient size and/or use of iterative reconstruction technique. COMPARISON:  None.  FINDINGS: CT HEAD FINDINGS Brain: There is no evidence for acute hemorrhage, hydrocephalus, mass lesion, or abnormal extra-axial fluid collection. No definite CT evidence for acute infarction. Patchy low attenuation in the deep hemispheric and periventricular white matter is nonspecific, but likely reflects chronic microvascular ischemic demyelination. Vascular: No hyperdense vessel or unexpected calcification. Skull: No evidence for fracture. No worrisome lytic or sclerotic lesion. Sinuses/Orbits: The visualized paranasal sinuses and mastoid air cells are clear. Visualized portions of the globes and intraorbital fat are unremarkable. Other: None. CT CERVICAL SPINE FINDINGS Alignment: Normal. Skull base and vertebrae: No acute fracture. No primary bone lesion or focal pathologic process. Soft tissues and spinal canal: No prevertebral fluid or swelling. No visible canal hematoma. Disc levels: Diffuse loss of intervertebral disc height noted in the cervical spine without evidence for traumatic subluxation. Right-sided C2-3 and C4-5 facets are fused. Left-sided facets are fused at C2-3, C3-4, C4-5. Upper chest: Unremarkable. Other: None. IMPRESSION: 1. No acute intracranial abnormality. 2. Chronic small vessel ischemic disease. 3. No evidence for cervical spine fracture or traumatic subluxation. 4. Diffuse degenerative changes in the cervical spine as above. Electronically Signed   By: Misty Stanley M.D.   On: 03/21/2022 11:13   CT Cervical Spine Wo Contrast  Result Date: 03/21/2022 CLINICAL DATA:  Status post fall.  Found down. EXAM: CT HEAD WITHOUT CONTRAST CT CERVICAL SPINE WITHOUT CONTRAST TECHNIQUE: Multidetector CT imaging of the head and cervical spine was performed following the standard protocol without intravenous contrast. Multiplanar CT image reconstructions of the cervical spine were also generated. RADIATION DOSE REDUCTION: This exam was performed according to the departmental dose-optimization  program which includes automated exposure control, adjustment of the mA and/or kV according to patient size and/or use of iterative reconstruction technique. COMPARISON:  None. FINDINGS: CT HEAD FINDINGS Brain: There is no evidence for acute hemorrhage, hydrocephalus, mass lesion, or abnormal extra-axial fluid collection. No definite CT evidence for acute infarction. Patchy low attenuation in the deep hemispheric and periventricular white matter is nonspecific, but likely reflects chronic microvascular ischemic demyelination. Vascular: No hyperdense vessel or unexpected calcification. Skull: No evidence for fracture. No worrisome lytic or sclerotic lesion. Sinuses/Orbits: The visualized paranasal sinuses and mastoid air cells are clear. Visualized portions of the globes and intraorbital fat are unremarkable. Other: None. CT CERVICAL SPINE FINDINGS Alignment: Normal. Skull base and vertebrae: No acute fracture. No primary bone lesion or focal pathologic process. Soft tissues and spinal canal: No prevertebral fluid or swelling. No visible canal hematoma. Disc levels: Diffuse loss of intervertebral disc height noted in the cervical spine without evidence for traumatic subluxation. Right-sided C2-3 and C4-5 facets are fused. Left-sided facets are fused at C2-3,  C3-4, C4-5. Upper chest: Unremarkable. Other: None. IMPRESSION: 1. No acute intracranial abnormality. 2. Chronic small vessel ischemic disease. 3. No evidence for cervical spine fracture or traumatic subluxation. 4. Diffuse degenerative changes in the cervical spine as above. Electronically Signed   By: Misty Stanley M.D.   On: 03/21/2022 11:13   DG Hip Unilat W or Wo Pelvis 2-3 Views Left  Result Date: 03/21/2022 CLINICAL DATA:  89 year old female status post fall 3 days ago, found down. Altered mental status and pain. EXAM: DG HIP (WITH OR WITHOUT PELVIS) 2-3V LEFT COMPARISON:  Right hip series today reported separately. FINDINGS: AP pelvis and left hip  series at 1044 hours. Femoral heads are normally located. Bone mineralization is within normal limits for age. No pelvis fracture identified. Left lower quadrant, left pelvic surgical clips. Negative visible bowel gas. Hip joint spaces appear symmetric and fairly normal for age. Bilateral acetabular degenerative spurring. Proximal right femur is detailed separately. Proximal left femur appears intact. IMPRESSION: 1. No acute fracture or dislocation identified about the left hip or pelvis. 2. Right hip reported separately. Electronically Signed   By: Genevie Ann M.D.   On: 03/21/2022 11:09   DG Chest 1 View  Result Date: 03/21/2022 CLINICAL DATA:  88 year old female status post fall 3 days ago, found down. Altered mental status and pain. EXAM: CHEST  1 VIEW COMPARISON:  Chest radiograph 12/23/2021 and earlier. FINDINGS: Portable supine view at 1054 hours. Mildly lower lung volumes. Stable mild tortuosity of the thoracic aorta. Other mediastinal contours are within normal limits. Visualized tracheal air column is within normal limits. Allowing for portable technique the lungs are clear. No pneumothorax or pleural effusion identified on this supine view. Stable visualized osseous structures. Negative visible bowel gas. IMPRESSION: No acute cardiopulmonary abnormality or acute traumatic injury identified. Electronically Signed   By: Genevie Ann M.D.   On: 03/21/2022 11:06   DG Knee Complete 4 Views Left  Result Date: 03/21/2022 CLINICAL DATA:  88 year old female status post fall 3 days ago, found down. Altered mental status and pain. EXAM: LEFT KNEE - COMPLETE 4+ VIEW COMPARISON:  Left tib fib series 12/31/2016. FINDINGS: Bulky chronic tricompartmental left knee joint degeneration. Bulky osteophytosis. No definite joint effusion. Patella appears intact. No acute osseous abnormality identified. Some generalized soft tissue swelling. IMPRESSION: Bulky chronic tricompartmental left knee joint degeneration. No acute  fracture or dislocation identified. Electronically Signed   By: Genevie Ann M.D.   On: 03/21/2022 11:05   DG Knee Complete 4 Views Right  Result Date: 03/21/2022 CLINICAL DATA:  88 year old female status post fall 3 days ago, found down. Altered mental status and pain. EXAM: RIGHT KNEE - COMPLETE 4+ VIEW COMPARISON:  None Available. FINDINGS: Right total knee arthroplasty. Hardware appears intact and aligned. No acute osseous abnormality identified. Patella appears intact with some postoperative and degenerative changes. No definite joint effusion on the cross-table lateral view. IMPRESSION: Right total knee arthroplasty with no acute fracture or dislocation identified about the right knee. Electronically Signed   By: Genevie Ann M.D.   On: 03/21/2022 11:04   DG Shoulder Left  Result Date: 03/21/2022 CLINICAL DATA:  88 year old female status post fall 3 days ago, found down. Altered mental status and pain. EXAM: LEFT SHOULDER - 2+ VIEW COMPARISON:  Contralateral left shoulder today. Left shoulder series 12/23/2021. FINDINGS: Very severe chronic left glenohumeral joint space loss with bulky osteophytosis and calcified loose bodies around the joint space. No glenohumeral joint dislocation. Proximal left humerus appears stable  and intact. Less pronounced moderate left AC joint degenerative spurring. Left clavicle and scapula appears stable and intact. Visible left chest and ribs appears stable and intact. IMPRESSION: Chronic very severe degeneration with no acute fracture or dislocation identified about the left shoulder. Electronically Signed   By: Genevie Ann M.D.   On: 03/21/2022 10:58   DG Shoulder Right  Result Date: 03/21/2022 CLINICAL DATA:  88 year old female status post fall 3 days ago, found down. Altered mental status and pain. EXAM: RIGHT SHOULDER - 2+ VIEW COMPARISON:  Chest radiographs 12/23/2021. FINDINGS: Severe glenohumeral joint space loss with degenerative spurring. No glenohumeral joint  dislocation. Proximal right humerus appears intact with mild degenerative calcification near the insertion of the rotator cuff. Moderate to severe AC joint space loss with degenerative spurring. Right clavicle and scapula appear intact. Visible right ribs and chest appear stable, negative. IMPRESSION: Advanced degenerative changes at the right shoulder but no acute fracture or dislocation identified. Electronically Signed   By: Genevie Ann M.D.   On: 03/21/2022 10:57   DG Hip Unilat W or Wo Pelvis 2-3 Views Right  Result Date: 03/21/2022 CLINICAL DATA:  [place] 3 days ago, on floor 10 hours, altered mental status, pain EXAM: DG HIP (WITH OR WITHOUT PELVIS) 2-3V RIGHT COMPARISON:  None Available. FINDINGS: Osseous mineralization normal. RIGHT hip and SI joint spaces preserved. No acute fracture, dislocation, or bone destruction. IMPRESSION: No acute abnormalities. Electronically Signed   By: Lavonia Dana M.D.   On: 03/21/2022 10:56       LOS: 0 days   [facility] Pager on [email or web address]  03/22/2022, 10:01 AM

## 2022-03-22 NOTE — Evaluation (Signed)
Physical Therapy Evaluation Patient Details Name: Mia Simpson MRN: [id number] DOB: November 18, 1927 Today's Date: 03/22/2022  History of Present Illness  88 yo female presents to ED on 10/24 with frequent falls. workup for UTI, orthostatic hypotension. PMH includes vaginal cancer status post chemo and radiation therapy, stopped in 2022 and has been on active hospice at home, multiple OA, orthostatic hypotension and frequent falls.  Clinical Impression   Pt presents with generalized weakness, LE and L shoulder pain secondary to falls, impaired balance, and impaired activity tolerance vs baseline. Pt to benefit from acute PT to address deficits. Pt stood at EOB x10 seconds only, citing fatigue and fear of falling as reason for stopping. Pt overall requiring mod assist for mobility this session, per pt she is by herself for some of the day and at night, PT recommending constant supervision either at home or at facility level. PT to progress mobility as tolerated, and will continue to follow acutely.    BP, HR: -supine: 104/56, 75 -sitting: 110/55, 93 -unable to obtain standing Bps given poor pt tolerance and fatigue      Recommendations for follow up therapy are one component of a multi-disciplinary discharge planning process, led by the attending physician.  Recommendations may be updated based on patient status, additional functional criteria and insurance authorization.  Follow Up Recommendations Skilled nursing-short term rehab (<3 hours/day) (vs inpatient hospice? unsure of plan moving forward) Can patient physically be transported by private vehicle: No    Assistance Recommended at Discharge Frequent or constant Supervision/Assistance  Patient can return home with the following  A lot of help with walking and/or transfers;A lot of help with bathing/dressing/bathroom;Assistance with cooking/housework;Direct supervision/assist for medications management;Direct supervision/assist for financial  management;Assist for transportation;Help with stairs or ramp for entrance    Equipment Recommendations None recommended by PT  Recommendations for Other Services       Functional Status Assessment Patient has had a recent decline in their functional status and/or demonstrates limited ability to make significant improvements in function in a reasonable and predictable amount of time     Precautions / Restrictions Precautions Precautions: Fall Restrictions Weight Bearing Restrictions: No      Mobility  Bed Mobility Overal bed mobility: Needs Assistance Bed Mobility: Supine to Sit, Sit to Supine     Supine to sit: Mod assist, HOB elevated Sit to supine: Mod assist, HOB elevated   General bed mobility comments: assist for trunk and LE management, scooting to/from EOB, use of bedrail and HOB elevation.    Transfers Overall transfer level: Needs assistance Equipment used: Rolling walker (2 wheels) Transfers: Sit to/from Stand Sit to Stand: Mod assist, From elevated surface           General transfer comment: assist for power up, rise, and steady, cues for hand placement. Pt tolerated standing x10 seconds before needing to sit back down, stating she was weak and felt that she may fall    Ambulation/Gait                  Stairs            Wheelchair Mobility    Modified Rankin (Stroke Patients Only)       Balance Overall balance assessment: Needs assistance, History of Falls Sitting-balance support: Single extremity supported, Feet supported Sitting balance-Leahy Scale: Poor Sitting balance - Comments: fair to poor, difficulty maintaining upright sitting with heavy lateral bias (observed both L and R)   Standing balance support: Bilateral upper extremity supported,  Reliant on assistive device for balance Standing balance-Leahy Scale: Poor Standing balance comment: relaint on RW, tolerates static standing only                              Pertinent Vitals/Pain Pain Assessment Pain Assessment: Faces Faces Pain Scale: Hurts little more Pain Location: bilat LEs, L shoulder from falls Pain Descriptors / Indicators: Discomfort, Sore Pain Intervention(s): Limited activity within patient's tolerance, Monitored during session, Repositioned    Home Living Family/patient expects to be discharged to:: Private residence Living Arrangements: Alone Available Help at Discharge: Family;Available PRN/intermittently (daughter-in-law is there daily but not 100% of the time) Type of Home: House Home Access: Stairs to enter   [employer] of Steps: 1 to step up on the porch   Home Layout: One level Home Equipment: Conservation officer, nature (2 wheels);Cane - single point;Toilet riser      Prior Function Prior Level of Function : Needs assist;History of Falls (last six months);Patient poor historian/Family not available             Mobility Comments: pt states she uses a RW at all times in the home and community. Pt states she does not drive, her family takes her to/from church and to appointments` ADLs Comments: Pt states hospice team gives her a bath if needed, and pt's daughter-in-law and son help as needed with ADLs. Pt unabel to give specifics or clarification when asked     Hand Dominance   Dominant Hand: Right    Extremity/Trunk Assessment   Upper Extremity Assessment Upper Extremity Assessment: Defer to OT evaluation    Lower Extremity Assessment Lower Extremity Assessment: Generalized weakness    Cervical / Trunk Assessment Cervical / Trunk Assessment: Kyphotic  Communication   Communication: No difficulties  Cognition Arousal/Alertness: Awake/alert Behavior During Therapy: WFL for tasks assessed/performed Overall Cognitive Status: No family/caregiver present to determine baseline cognitive functioning                                 General Comments: Pt oriented to self, location and  situation. Pt lacks insight into current deficits, inconsistently provides history        General Comments      Exercises     Assessment/Plan    PT Assessment Patient needs continued PT services  PT Problem List Decreased strength;Decreased mobility;Decreased safety awareness;Decreased balance;Decreased knowledge of use of DME;Pain;Decreased activity tolerance;Decreased cognition       PT Treatment Interventions DME instruction;Therapeutic activities;Gait training;Therapeutic exercise;Patient/family education;Balance training;Functional mobility training;Neuromuscular re-education    PT Goals (Current goals can be found in the Care Plan section)  Acute Rehab PT Goals Patient Stated Goal: home PT Goal Formulation: With patient Time For Goal Achievement: 04/05/22 Potential to Achieve Goals: Fair    Frequency Min 2X/week     Co-evaluation               AM-PAC PT "6 Clicks" Mobility  Outcome Measure Help needed turning from your back to your side while in a flat bed without using bedrails?: A Lot Help needed moving from lying on your back to sitting on the side of a flat bed without using bedrails?: A Lot Help needed moving to and from a bed to a chair (including a wheelchair)?: A Lot Help needed standing up from a chair using your arms (e.g., wheelchair or bedside chair)?: A Lot Help needed  to walk in hospital room?: Total Help needed climbing 3-5 steps with a railing? : Total 6 Click Score: 10    End of Session   Activity Tolerance: Patient limited by fatigue Patient left: in bed;with call bell/phone within reach;with bed alarm set Nurse Communication: Mobility status PT Visit Diagnosis: Other abnormalities of gait and mobility (R26.89);Muscle weakness (generalized) (M62.81)    Time: 2641-5830 PT Time Calculation (min) (ACUTE ONLY): 26 min   Charges:   PT Evaluation $PT Eval Low Complexity: 1 Low PT Treatments $Therapeutic Activity: 8-22 mins        Stacie Glaze, PT DPT Acute Rehabilitation Services Pager [phone]  Office [phone]   [place] E Stroup 03/22/2022, 10:06 AM

## 2022-03-23 ENCOUNTER — Telehealth: Payer: Self-pay | Admitting: Family Medicine

## 2022-03-23 DIAGNOSIS — R319 Hematuria, unspecified: Secondary | ICD-10-CM | POA: Diagnosis not present

## 2022-03-23 DIAGNOSIS — N390 Urinary tract infection, site not specified: Secondary | ICD-10-CM | POA: Diagnosis not present

## 2022-03-23 DIAGNOSIS — D649 Anemia, unspecified: Secondary | ICD-10-CM | POA: Diagnosis not present

## 2022-03-23 LAB — BPAM RBC
Blood Product Expiration Date: 202311012359
Blood Product Expiration Date: 202311172359
ISSUE DATE / TIME: 202310251202
ISSUE DATE / TIME: 202310251441
Unit Type and Rh: 600
Unit Type and Rh: 6200

## 2022-03-23 LAB — TYPE AND SCREEN
ABO/RH(D): A POS
Antibody Screen: NEGATIVE
Unit division: 0
Unit division: 0

## 2022-03-23 LAB — CBC
HCT: 25.9 % — ABNORMAL LOW (ref 36.0–46.0)
Hemoglobin: 8.7 g/dL — ABNORMAL LOW (ref 12.0–15.0)
MCH: 28.1 pg (ref 26.0–34.0)
MCHC: 33.6 g/dL (ref 30.0–36.0)
MCV: 83.5 fL (ref 80.0–100.0)
Platelets: 336 10*3/uL (ref 150–400)
RBC: 3.1 MIL/uL — ABNORMAL LOW (ref 3.87–5.11)
RDW: 16.4 % — ABNORMAL HIGH (ref 11.5–15.5)
WBC: 11.2 10*3/uL — ABNORMAL HIGH (ref 4.0–10.5)
nRBC: 0.2 % (ref 0.0–0.2)

## 2022-03-23 LAB — BASIC METABOLIC PANEL
Anion gap: 8 (ref 5–15)
BUN: 18 mg/dL (ref 8–23)
CO2: 21 mmol/L — ABNORMAL LOW (ref 22–32)
Calcium: 8 mg/dL — ABNORMAL LOW (ref 8.9–10.3)
Chloride: 111 mmol/L (ref 98–111)
Creatinine, Ser: 0.77 mg/dL (ref 0.44–1.00)
GFR, Estimated: >60 mL/min (ref >60)
Glucose, Bld: 125 mg/dL — ABNORMAL HIGH (ref 70–99)
Potassium: 3.8 mmol/L (ref 3.5–5.1)
Sodium: 140 mmol/L (ref 135–145)

## 2022-03-23 MED ORDER — DICLOFENAC SODIUM 1 % EX GEL
2.0000 g | Freq: Four times a day (QID) | CUTANEOUS | Status: DC
  Administered 2022-03-23 – 2022-03-24 (×5): 2 g via TOPICAL
  Filled 2022-03-23 (×2): qty 100

## 2022-03-23 MED ORDER — CEPHALEXIN 500 MG PO CAPS
500.0000 mg | ORAL_CAPSULE | Freq: Three times a day (TID) | ORAL | Status: DC
  Administered 2022-03-23 – 2022-03-24 (×3): 500 mg via ORAL
  Filled 2022-03-23 (×3): qty 1

## 2022-03-23 NOTE — TOC Initial Note (Addendum)
Transition of Care [facility]) - Initial/Assessment Note    Patient Details  Name: Mia Simpson MRN: [id number] Date of Birth: 1928/03/22  [facility] CM/SW Contact:    Curlene Labrum, RN Phone Number: 03/23/2022, 11:23 AM  Clinical Narrative:                 CM met with the patient at the bedside and she would like to return home with home hospice support through [facility] while she looks for a LTC facility.  She is agreeable to have an FL2 prepared and be faxed out in the hub for available skilled nursing facilities in the area so that she can look for an acceptable facility to transfer to at a later date after returning home.  I spoke with the patient's daughter on the phone and she states that she plans to assist the patient to find a suitable nursing home.  I called and spoke with Jackelyn Poling, SW with [facility] and she will provide a RN assessment within patient's return to the home within 24 hours and will have SW follow up with the family to assist.  DME at the home at this time includes RW, WC, shower chair and bedside commode.  CM will continue to follow the patient for discharge to home tomorrow.  Expected Discharge Plan: Home w Hospice Care Barriers to Discharge: Continued Medical Work up   Patient Goals and CMS Choice Patient states their goals for this hospitalization and ongoing recovery are:: To return home and look for LTC facility at a later date CMS Medicare.gov Compare Post Acute Care list provided to:: Patient Choice offered to / list presented to : Patient  Expected Discharge Plan and Services Expected Discharge Plan: [place]   Discharge Planning Services: CM Consult   Living arrangements for the past 2 months: Single Family Home                                      Prior Living Arrangements/Services Living arrangements for the past 2 months: Single Family Home Lives with:: Self Patient language and need for interpreter  reviewed:: Yes Do you feel safe going back to the place where you live?: Yes      Need for Family Participation in Patient Care: Yes (Comment) Care giver support system in place?: Yes (comment) Current home services: DME (RW, WC, shower chair, bedside commode) Criminal Activity/Legal Involvement Pertinent to Current Situation/Hospitalization: No - Comment as needed  Activities of Daily Living      Permission Sought/Granted Permission sought to share information with : Case Manager, Family Supports, Chartered certified accountant granted to share information with : Yes, Verbal Permission Granted     Permission granted to share info w AGENCY: [facility]  Permission granted to share info w Relationship: Mia Simpson - daughter     Emotional Assessment Appearance:: Appears stated age Attitude/Demeanor/Rapport: Gracious Affect (typically observed): Accepting Orientation: : Oriented to Self, Oriented to Place, Oriented to  Time, Oriented to Situation Alcohol / Substance Use: Not Applicable Psych Involvement: No (comment)  Admission diagnosis:  Fall [W19.XXXA] Fall, initial encounter [id number]] Urinary tract infection with hematuria, site unspecified [N39.0, R31.9] Anemia, unspecified type [D64.9] Symptomatic anemia [D64.9] Patient Active Problem List   Diagnosis Date Noted   Symptomatic anemia 03/22/2022   Anemia 03/21/2022   UTI (urinary tract infection) 03/21/2022   Fall 03/21/2022   Atherosclerosis of aorta (  [place]) 05/06/2021   Varicose veins of bilateral lower extremities with other complications 53/61/4431   Weight loss, abnormal 09/06/2020   Other constipation 09/06/2020   Goals of care, counseling/discussion 06/18/2019   Shoulder, capsulitis, adhesive 05/06/2019   Meningioma ([place]) 02/19/2019   Vaginal mass 02/10/2019   Vaginal cancer ([place]) 02/10/2019   History of colonic polyps 02/10/2019   History of partial colectomy 02/10/2019   Obesity (BMI  30.0-34.9) 02/10/2019   Bilateral lower extremity edema 11/04/2018   Class 1 obesity with serious comorbidity and body mass index (BMI) of 34.0 to 34.9 in adult 11/04/2018   Vitamin D deficiency, unspecified 11/04/2018   Conductive hearing loss, bilateral 06/06/2018   Sternal fracture 12/31/2016   Bilateral impacted cerumen 10/10/2016   S/P TKR (total knee replacement) using cement, right 06/16/2016   Essential (primary) hypertension 05/01/2016   Preoperative clearance 05/01/2016   DJD (degenerative joint disease) of knee 05/01/2016   PCP:  [name], [name], MD Pharmacy:   [id number] [place], [place] [id number]: [phone]: [phone] [facility] [place]Phone: [phone]: [phone]    Social Determinants of Health (SDOH) Interventions    Readmission Risk Interventions    03/23/2022   11:23 AM  Readmission Risk Prevention Plan  Post Dischage Appt Complete  Medication Screening Complete  Transportation Screening Complete

## 2022-03-23 NOTE — Telephone Encounter (Signed)
Daughter wants to speak with provider regarding steps to get her mother into a nursing home.

## 2022-03-23 NOTE — Progress Notes (Addendum)
[facility] PROGRESS NOTE   Mia Simpson HYQ:[id number] DOB: 27-Jun-1927 DOA: 03/21/2022  PCP: [name], [name], MD  Brief History/Interval Summary: 88 y.o. female with medical history significant of vaginal cancer status post chemo and radiation therapy, stopped in 2022 and has been on active hospice at home, multiple OA, orthostatic hypotension and frequent falls, presented with frequent falls. Appears that the patient has had frequent episodes of feeling of lightheadedness at least since July 2023, has been evaluated by PCP in August, office visit showed blood pressure 98/60.  Recently, increasingly, patient has had frequent episodes of increasing lightheadedness and fall at home.  She also reported that she has had burning sensation with urination and increasing night urination, denies any abdominal pain fever or chills.  Patient was diagnosed with vaginal cancer about 2 years ago completed chemo and radiation therapy however was found to have recurrent cancer and or active treatment discontinued in 2022 and patient was placed home hospice.  Family report patient occasionally has blood spots and clot in her underwear but no normal large quantity of blood or clots.   Consultants: None  Procedures: Blood transfusion    Subjective/Interval History: Patient seems to be slightly distracted this morning.  Denies any abdominal pain but has been having pain in her arms legs and back.  This is after the falls that she sustained at home.     Assessment/Plan:  Acute on chronic microcytic anemia/chronic blood loss anemia Patient has intermittent bleeding due to her vaginal cancer.   Hemoglobin was noted to be 6.1 yesterday.  She was transfused 2 units of PRBC.  Hemoglobin has increased to 8.7.  We will recheck labs tomorrow.   Anemia panel did not suggest iron deficiency.  Orthostatic hypotension Secondary to anemia.   Urinary tract infection Unable to elicit any urinary symptoms from  the patient.  Does not look like a urine culture was ever sent.   WBC was mildly elevated and appears to have improved.  She is afebrile.  We will change her to Keflex.  Recurrent vaginal cancer Currently off of chemotherapy and radiation.  Under hospice care at home.  Frequent falls Likely due to weakness from anemia and perhaps also secondary to urinary tract infection. PT and OT evaluation. She has had multiple imaging studies which have not shown any fractures or other injuries.  Pain is likely due to the falls and probable muscular contusion.  Goals of care She is DNR.  Discussed with patient's daughter.  Plan is for patient to go back home with hospice.  Daughter and other family members are looking at placing her in a nursing facility as they are finding it difficult to take care of her.  However at this time they would like for her to come home.    DVT Prophylaxis: SCDs Code Status: DNR Family Communication: Discussed with daughter Disposition Plan: Plan on discharge tomorrow  Status is: Inpatient Remains inpatient appropriate because: Symptomatic anemia     Medications: Scheduled:  diclofenac Sodium  2 g Topical QID   feeding supplement  237 mL Oral BID BM   lidocaine  1 patch Transdermal Q24H   multivitamin with minerals  1 tablet Oral Daily   senna  2 tablet Oral Daily   Continuous:  cefTRIAXone (ROCEPHIN)  IV 1 g (03/22/22 1836)   XBM:[id number], fluticasone, ondansetron **OR** ondansetron (ZOFRAN) IV  Antibiotics: Anti-infectives (From admission, onward)    Start     Dose/Rate Route Frequency Ordered Stop   03/22/22  1000  cefTRIAXone (ROCEPHIN) 1 g in sodium chloride 0.9 % 100 mL IVPB        1 g 200 mL/hr over 30 Minutes Intravenous Every 24 hours 03/21/22 1258     03/21/22 1245  cefTRIAXone (ROCEPHIN) 1 g in sodium chloride 0.9 % 100 mL IVPB        1 g 200 mL/hr over 30 Minutes Intravenous  Once 03/21/22 1233 03/21/22 1344       Objective:  Vital  Signs  Vitals:   03/22/22 1715 03/22/22 2046 03/23/22 0355 03/23/22 0739  BP: 128/67 (!) 120/59 120/69 126/78  Pulse: 82 89 80 82  Resp: '18 15 16 15  '$ Temp: 98 F (36.7 C) 98.7 F (37.1 C) 98.4 F (36.9 C) 98.9 F (37.2 C)  TempSrc: Oral  Oral Oral  SpO2: 100% 99% 100% 100%    Intake/Output Summary (Last 24 hours) at 03/23/2022 9024 Last data filed at 03/23/2022 0745 Gross per 24 hour  Intake 2536 ml  Output --  Net 2536 ml    There were no vitals filed for this visit.   General appearance: Awake alert.  In no distress.  Distracted Resp: Clear to auscultation bilaterally.  Normal effort Cardio: S1-S2 is normal regular.  No S3-S4.  No rubs murmurs or bruit GI: Abdomen is soft.  Nontender nondistended.  Bowel sounds are present normal.  No masses organomegaly Extremities: Mild edema bilateral lower extremities.  Physical deconditioning noted.   Lab Results:  Data Reviewed: I have personally reviewed following labs and reports of the imaging studies  CBC: Recent Labs  Lab 03/21/22 0935 03/22/22 0648 03/23/22 0326  WBC 13.8* 10.6* 11.2*  HGB 7.4* 6.1* 8.7*  HCT 24.5* 19.1* 25.9*  MCV 90.4 84.9 83.5  PLT 445* 365 336     Basic Metabolic Panel: Recent Labs  Lab 03/21/22 0935 03/22/22 0648 03/23/22 0326  NA 141 139 140  K 4.6 3.6 3.8  CL 108 111 111  CO2 21* 22 21*  GLUCOSE 103* 82 125*  BUN 30* 17 18  CREATININE 1.02* 0.86 0.77  CALCIUM 8.8* 8.4* 8.0*     GFR: CrCl cannot be calculated (Unknown ideal weight.).  Liver Function Tests: Recent Labs  Lab 03/21/22 0935  AST 27  ALT 16  ALKPHOS 55  BILITOT 0.6  PROT 6.4*  ALBUMIN 2.3*     Coagulation Profile: Recent Labs  Lab 03/21/22 0935  INR 1.2     Cardiac Enzymes: Recent Labs  Lab 03/21/22 0935  CKTOTAL 164      Anemia Panel: Recent Labs    03/21/22 0935  FERRITIN 315*  TIBC 238*  IRON 44  RETICCTPCT 1.9     Recent Results (from the past 240 hour(s))  SARS  Coronavirus 2 by RT PCR (hospital order, performed in [facility] hospital lab) *cepheid single result test* Anterior Nasal Swab     Status: None   Collection Time: 03/21/22  9:54 AM   Specimen: Anterior Nasal Swab  Result Value Ref Range Status   SARS Coronavirus 2 by RT PCR NEGATIVE NEGATIVE Final    Comment: (NOTE) SARS-CoV-2 target nucleic acids are NOT DETECTED.  The SARS-CoV-2 RNA is generally detectable in upper and lower respiratory specimens during the acute phase of infection. The lowest concentration of SARS-CoV-2 viral copies this assay can detect is 250 copies / mL. A negative result does not preclude SARS-CoV-2 infection and should not be used as the sole basis for treatment or other patient management  decisions.  A negative result may occur with improper specimen collection / handling, submission of specimen other than nasopharyngeal swab, presence of viral mutation(s) within the areas targeted by this assay, and inadequate number of viral copies (<250 copies / mL). A negative result must be combined with clinical observations, patient history, and epidemiological information.  Fact Sheet for Patients:   https://www.patel.info/  Fact Sheet for Healthcare Providers: https://hall.com/  This test is not yet approved or  cleared by the [place] FDA and has been authorized for detection and/or diagnosis of SARS-CoV-2 by FDA under an Emergency Use Authorization (EUA).  This EUA will remain in effect (meaning this test can be used) for the duration of the COVID-19 declaration under Section 564(b)(1) of the Act, 21 U.S.C. section 360bbb-3(b)(1), unless the authorization is terminated or revoked sooner.  Performed at [facility], [place] [address] [place], [place] [address]       Radiology Studies: CT Head Wo Contrast  Result Date: 03/21/2022 CLINICAL DATA:  Status post fall.  Found down. EXAM: CT HEAD WITHOUT  CONTRAST CT CERVICAL SPINE WITHOUT CONTRAST TECHNIQUE: Multidetector CT imaging of the head and cervical spine was performed following the standard protocol without intravenous contrast. Multiplanar CT image reconstructions of the cervical spine were also generated. RADIATION DOSE REDUCTION: This exam was performed according to the departmental dose-optimization program which includes automated exposure control, adjustment of the mA and/or kV according to patient size and/or use of iterative reconstruction technique. COMPARISON:  None. FINDINGS: CT HEAD FINDINGS Brain: There is no evidence for acute hemorrhage, hydrocephalus, mass lesion, or abnormal extra-axial fluid collection. No definite CT evidence for acute infarction. Patchy low attenuation in the deep hemispheric and periventricular white matter is nonspecific, but likely reflects chronic microvascular ischemic demyelination. Vascular: No hyperdense vessel or unexpected calcification. Skull: No evidence for fracture. No worrisome lytic or sclerotic lesion. Sinuses/Orbits: The visualized paranasal sinuses and mastoid air cells are clear. Visualized portions of the globes and intraorbital fat are unremarkable. Other: None. CT CERVICAL SPINE FINDINGS Alignment: Normal. Skull base and vertebrae: No acute fracture. No primary bone lesion or focal pathologic process. Soft tissues and spinal canal: No prevertebral fluid or swelling. No visible canal hematoma. Disc levels: Diffuse loss of intervertebral disc height noted in the cervical spine without evidence for traumatic subluxation. Right-sided C2-3 and C4-5 facets are fused. Left-sided facets are fused at C2-3, C3-4, C4-5. Upper chest: Unremarkable. Other: None. IMPRESSION: 1. No acute intracranial abnormality. 2. Chronic small vessel ischemic disease. 3. No evidence for cervical spine fracture or traumatic subluxation. 4. Diffuse degenerative changes in the cervical spine as above. Electronically Signed   By:  Misty Stanley M.D.   On: 03/21/2022 11:13   CT Cervical Spine Wo Contrast  Result Date: 03/21/2022 CLINICAL DATA:  Status post fall.  Found down. EXAM: CT HEAD WITHOUT CONTRAST CT CERVICAL SPINE WITHOUT CONTRAST TECHNIQUE: Multidetector CT imaging of the head and cervical spine was performed following the standard protocol without intravenous contrast. Multiplanar CT image reconstructions of the cervical spine were also generated. RADIATION DOSE REDUCTION: This exam was performed according to the departmental dose-optimization program which includes automated exposure control, adjustment of the mA and/or kV according to patient size and/or use of iterative reconstruction technique. COMPARISON:  None. FINDINGS: CT HEAD FINDINGS Brain: There is no evidence for acute hemorrhage, hydrocephalus, mass lesion, or abnormal extra-axial fluid collection. No definite CT evidence for acute infarction. Patchy low attenuation in the deep hemispheric and periventricular white  matter is nonspecific, but likely reflects chronic microvascular ischemic demyelination. Vascular: No hyperdense vessel or unexpected calcification. Skull: No evidence for fracture. No worrisome lytic or sclerotic lesion. Sinuses/Orbits: The visualized paranasal sinuses and mastoid air cells are clear. Visualized portions of the globes and intraorbital fat are unremarkable. Other: None. CT CERVICAL SPINE FINDINGS Alignment: Normal. Skull base and vertebrae: No acute fracture. No primary bone lesion or focal pathologic process. Soft tissues and spinal canal: No prevertebral fluid or swelling. No visible canal hematoma. Disc levels: Diffuse loss of intervertebral disc height noted in the cervical spine without evidence for traumatic subluxation. Right-sided C2-3 and C4-5 facets are fused. Left-sided facets are fused at C2-3, C3-4, C4-5. Upper chest: Unremarkable. Other: None. IMPRESSION: 1. No acute intracranial abnormality. 2. Chronic small vessel  ischemic disease. 3. No evidence for cervical spine fracture or traumatic subluxation. 4. Diffuse degenerative changes in the cervical spine as above. Electronically Signed   By: Misty Stanley M.D.   On: 03/21/2022 11:13   DG Hip Unilat W or Wo Pelvis 2-3 Views Left  Result Date: 03/21/2022 CLINICAL DATA:  88 year old female status post fall 3 days ago, found down. Altered mental status and pain. EXAM: DG HIP (WITH OR WITHOUT PELVIS) 2-3V LEFT COMPARISON:  Right hip series today reported separately. FINDINGS: AP pelvis and left hip series at 1044 hours. Femoral heads are normally located. Bone mineralization is within normal limits for age. No pelvis fracture identified. Left lower quadrant, left pelvic surgical clips. Negative visible bowel gas. Hip joint spaces appear symmetric and fairly normal for age. Bilateral acetabular degenerative spurring. Proximal right femur is detailed separately. Proximal left femur appears intact. IMPRESSION: 1. No acute fracture or dislocation identified about the left hip or pelvis. 2. Right hip reported separately. Electronically Signed   By: Genevie Ann M.D.   On: 03/21/2022 11:09   DG Chest 1 View  Result Date: 03/21/2022 CLINICAL DATA:  88 year old female status post fall 3 days ago, found down. Altered mental status and pain. EXAM: CHEST  1 VIEW COMPARISON:  Chest radiograph 12/23/2021 and earlier. FINDINGS: Portable supine view at 1054 hours. Mildly lower lung volumes. Stable mild tortuosity of the thoracic aorta. Other mediastinal contours are within normal limits. Visualized tracheal air column is within normal limits. Allowing for portable technique the lungs are clear. No pneumothorax or pleural effusion identified on this supine view. Stable visualized osseous structures. Negative visible bowel gas. IMPRESSION: No acute cardiopulmonary abnormality or acute traumatic injury identified. Electronically Signed   By: Genevie Ann M.D.   On: 03/21/2022 11:06   DG Knee  Complete 4 Views Left  Result Date: 03/21/2022 CLINICAL DATA:  88 year old female status post fall 3 days ago, found down. Altered mental status and pain. EXAM: LEFT KNEE - COMPLETE 4+ VIEW COMPARISON:  Left tib fib series 12/31/2016. FINDINGS: Bulky chronic tricompartmental left knee joint degeneration. Bulky osteophytosis. No definite joint effusion. Patella appears intact. No acute osseous abnormality identified. Some generalized soft tissue swelling. IMPRESSION: Bulky chronic tricompartmental left knee joint degeneration. No acute fracture or dislocation identified. Electronically Signed   By: Genevie Ann M.D.   On: 03/21/2022 11:05   DG Knee Complete 4 Views Right  Result Date: 03/21/2022 CLINICAL DATA:  88 year old female status post fall 3 days ago, found down. Altered mental status and pain. EXAM: RIGHT KNEE - COMPLETE 4+ VIEW COMPARISON:  None Available. FINDINGS: Right total knee arthroplasty. Hardware appears intact and aligned. No acute osseous abnormality identified. Patella appears intact  with some postoperative and degenerative changes. No definite joint effusion on the cross-table lateral view. IMPRESSION: Right total knee arthroplasty with no acute fracture or dislocation identified about the right knee. Electronically Signed   By: Genevie Ann M.D.   On: 03/21/2022 11:04   DG Shoulder Left  Result Date: 03/21/2022 CLINICAL DATA:  88 year old female status post fall 3 days ago, found down. Altered mental status and pain. EXAM: LEFT SHOULDER - 2+ VIEW COMPARISON:  Contralateral left shoulder today. Left shoulder series 12/23/2021. FINDINGS: Very severe chronic left glenohumeral joint space loss with bulky osteophytosis and calcified loose bodies around the joint space. No glenohumeral joint dislocation. Proximal left humerus appears stable and intact. Less pronounced moderate left AC joint degenerative spurring. Left clavicle and scapula appears stable and intact. Visible left chest and ribs  appears stable and intact. IMPRESSION: Chronic very severe degeneration with no acute fracture or dislocation identified about the left shoulder. Electronically Signed   By: Genevie Ann M.D.   On: 03/21/2022 10:58   DG Shoulder Right  Result Date: 03/21/2022 CLINICAL DATA:  88 year old female status post fall 3 days ago, found down. Altered mental status and pain. EXAM: RIGHT SHOULDER - 2+ VIEW COMPARISON:  Chest radiographs 12/23/2021. FINDINGS: Severe glenohumeral joint space loss with degenerative spurring. No glenohumeral joint dislocation. Proximal right humerus appears intact with mild degenerative calcification near the insertion of the rotator cuff. Moderate to severe AC joint space loss with degenerative spurring. Right clavicle and scapula appear intact. Visible right ribs and chest appear stable, negative. IMPRESSION: Advanced degenerative changes at the right shoulder but no acute fracture or dislocation identified. Electronically Signed   By: Genevie Ann M.D.   On: 03/21/2022 10:57   DG Hip Unilat W or Wo Pelvis 2-3 Views Right  Result Date: 03/21/2022 CLINICAL DATA:  [place] 3 days ago, on floor 10 hours, altered mental status, pain EXAM: DG HIP (WITH OR WITHOUT PELVIS) 2-3V RIGHT COMPARISON:  None Available. FINDINGS: Osseous mineralization normal. RIGHT hip and SI joint spaces preserved. No acute fracture, dislocation, or bone destruction. IMPRESSION: No acute abnormalities. Electronically Signed   By: Lavonia Dana M.D.   On: 03/21/2022 10:56       LOS: 1 day   [facility] Pager on [email or web address]  03/23/2022, 9:23 AM

## 2022-03-23 NOTE — Progress Notes (Addendum)
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 Patient brought into the ED by her daughter, Mia Simpson, after a fall.  Mia Simpson  contacted hospice to notify of patient being in the hospital. Mia Simpson was admitted to [facility] in the afternoon on 10.24 for UTI and low hemoglobin. Per Dr.Feldmann, ACC MD, this is a related hospital admission.    Mia Simpson was sleeping when I went to visit. Report exchanged with RN. Patient is anticipated to discharge home tomorrow with PO antibiotic.IV antibiotic discontinued today. Called patient's daughter via telephone to provide update,left a message.       Patient is inpatient appropriate due to requiring IV antibiotic.      V/S:  125/59 bbp,  98.9 Temp, 94 bpm, 15 RR, 100% on RA   I&O: 2166/not recorded    Labs:    CO2: 21 (L) Glucose: 125 (H) Calcium: 8.0 (L) WBC: 11.2 (H) RBC: 3.10 (L) Hemoglobin: 8.7 (L) HCT: 25.9 (L) RDW: 16.4 (H)     Diagnostics:  None today.   IV/PRN:  cefTRIAXone (ROCEPHIN) 1 g in sodium chloride 0.9 % 100 mL IVPB Dose: 1 g Freq:  Once Route: IV x1   Problem list:   Assessment/Plan:   Acute on chronic microcytic anemia/chronic blood loss anemia Patient has intermittent bleeding due to her vaginal cancer.   Hemoglobin was noted to be 6.1 yesterday.  She was transfused 2 units of PRBC.  Hemoglobin has increased to 8.7.  We will recheck labs tomorrow.   Anemia panel did not suggest iron deficiency.   Orthostatic hypotension Secondary to anemia.    Urinary tract infection Unable to elicit any urinary symptoms from the patient.  Does not look like a urine culture was ever sent.   WBC was mildly elevated and appears to have improved.  She is afebrile.  We will change her to Keflex.   Recurrent vaginal cancer Currently off of chemotherapy and radiation.  Under hospice care at home.   Frequent  falls Likely due to weakness from anemia and perhaps also secondary to urinary tract infection. PT and OT evaluation. She has had multiple imaging studies which have not shown any fractures or other injuries.  Pain is likely due to the falls and probable muscular contusion.   Goals of care She is DNR.  Discussed with patient's daughter.  Plan is for patient to go back home with hospice.  Daughter and other family members are looking at placing her in a nursing facility as they are finding it difficult to take care of her.  However at this time they would like for her to come home.   GOC: Patient is currently a DNR.    D/C planning: Anticipated to discharge home tomorrow. Once home, daughter would like to find SNF placement.   Family: Called patient's daughter via telephone to provide update, left a message.   IDT: Updated     Should patient need ambulance transfer - Please use [facility]) as they contract this service for our active hospice patients.      Mia Gottron, RN [facility] Liaison  [phone]

## 2022-03-24 ENCOUNTER — Other Ambulatory Visit (HOSPITAL_COMMUNITY): Payer: Self-pay

## 2022-03-24 DIAGNOSIS — D649 Anemia, unspecified: Secondary | ICD-10-CM | POA: Diagnosis not present

## 2022-03-24 LAB — CBC
HCT: 27.7 % — ABNORMAL LOW (ref 36.0–46.0)
Hemoglobin: 8.9 g/dL — ABNORMAL LOW (ref 12.0–15.0)
MCH: 27.6 pg (ref 26.0–34.0)
MCHC: 32.1 g/dL (ref 30.0–36.0)
MCV: 85.8 fL (ref 80.0–100.0)
Platelets: 346 10*3/uL (ref 150–400)
RBC: 3.23 MIL/uL — ABNORMAL LOW (ref 3.87–5.11)
RDW: 16.3 % — ABNORMAL HIGH (ref 11.5–15.5)
WBC: 9.4 10*3/uL (ref 4.0–10.5)
nRBC: 0 % (ref 0.0–0.2)

## 2022-03-24 MED ORDER — DICLOFENAC SODIUM 1 % EX GEL
2.0000 g | Freq: Four times a day (QID) | CUTANEOUS | 0 refills | Status: DC
  Filled 2022-03-24: qty 100, 13d supply, fill #0

## 2022-03-24 MED ORDER — CEPHALEXIN 500 MG PO CAPS
500.0000 mg | ORAL_CAPSULE | Freq: Three times a day (TID) | ORAL | 0 refills | Status: AC
  Filled 2022-03-24: qty 15, 5d supply, fill #0

## 2022-03-24 NOTE — Discharge Summary (Signed)
[facility]  Physician Discharge Summary   Patient ID: Mia Simpson MRN: [id number] DOB/AGE: 09-03-1927 88 y.o.  Admit date: 03/21/2022 Discharge date: 03/24/2022    PCP: [name], [name], MD  DISCHARGE DIAGNOSES:    UTI (urinary tract infection)   Symptomatic anemia Vaginal cancer  RECOMMENDATIONS FOR OUTPATIENT FOLLOW UP: Hospice to continue to follow patient at home   Home Health:None  Equipment/Devices: None  CODE STATUS: DNR  DISCHARGE CONDITION: fair  Diet recommendation: As before  INITIAL HISTORY: 88 y.o. female with medical history significant of vaginal cancer status post chemo and radiation therapy, stopped in 2022 and has been on active hospice at home, multiple OA, orthostatic hypotension and frequent falls, presented with frequent falls. Appears that the patient has had frequent episodes of feeling of lightheadedness at least since July 2023, has been evaluated by PCP in August, office visit showed blood pressure 98/60.  Recently, increasingly, patient has had frequent episodes of increasing lightheadedness and fall at home.  She also reported that she has had burning sensation with urination and increasing night urination, denies any abdominal pain fever or chills.  Patient was diagnosed with vaginal cancer about 2 years ago completed chemo and radiation therapy however was found to have recurrent cancer and or active treatment discontinued in 2022 and patient was placed home hospice.  Family report patient occasionally has blood spots and clot in her underwear but no normal large quantity of blood or clots.    Consultants: None   Procedures: Blood transfusion   HOSPITAL COURSE:   Acute on chronic microcytic anemia/chronic blood loss anemia/symptomatic anemia Patient has intermittent bleeding due to her vaginal cancer.   Hemoglobin was noted to be 6.1.  She was transfused 2 units of PRBC.  Hemoglobin has increased to 8.9.   Anemia panel did not  suggest iron deficiency.   Orthostatic hypotension Secondary to anemia.    Urinary tract infection Unable to elicit any urinary symptoms from the patient.  Does not look like a urine culture was ever sent.   WBC was mildly elevated and appears to have improved.  She will be discharged on Keflex.  Recurrent vaginal cancer Currently off of chemotherapy and radiation.  Under hospice care at home.   Frequent falls Likely due to weakness from anemia and perhaps also secondary to urinary tract infection. She has had multiple imaging studies which have not shown any fractures or other injuries.  Pain is likely due to the falls and probable muscular contusion.   Goals of care She is DNR.  Discussed with patient's daughter.  Plan is for patient to go back home with hospice.  Daughter and other family members are looking at placing her in a nursing facility as they are finding it difficult to take care of her.  However at this time they would like for her to come home.    Patient is stable for discharge home today with hospice services.   PERTINENT LABS:  The results of significant diagnostics from this hospitalization (including imaging, microbiology, ancillary and laboratory) are listed below for reference.    Microbiology: Recent Results (from the past 240 hour(s))  SARS Coronavirus 2 by RT PCR (hospital order, performed in [facility] hospital lab) *cepheid single result test* Anterior Nasal Swab     Status: None   Collection Time: 03/21/22  9:54 AM   Specimen: Anterior Nasal Swab  Result Value Ref Range Status   SARS Coronavirus 2 by RT PCR NEGATIVE NEGATIVE Final  Comment: (NOTE) SARS-CoV-2 target nucleic acids are NOT DETECTED.  The SARS-CoV-2 RNA is generally detectable in upper and lower respiratory specimens during the acute phase of infection. The lowest concentration of SARS-CoV-2 viral copies this assay can detect is 250 copies / mL. A negative result does not preclude  SARS-CoV-2 infection and should not be used as the sole basis for treatment or other patient management decisions.  A negative result may occur with improper specimen collection / handling, submission of specimen other than nasopharyngeal swab, presence of viral mutation(s) within the areas targeted by this assay, and inadequate number of viral copies (<250 copies / mL). A negative result must be combined with clinical observations, patient history, and epidemiological information.  Fact Sheet for Patients:   https://www.patel.info/  Fact Sheet for Healthcare Providers: https://hall.com/  This test is not yet approved or  cleared by the [place] FDA and has been authorized for detection and/or diagnosis of SARS-CoV-2 by FDA under an Emergency Use Authorization (EUA).  This EUA will remain in effect (meaning this test can be used) for the duration of the COVID-19 declaration under Section 564(b)(1) of the Act, 21 U.S.C. section 360bbb-3(b)(1), unless the authorization is terminated or revoked sooner.  Performed at [facility], [place] [address] [place], [place] [address]      Labs:   Basic Metabolic Panel: Recent Labs  Lab 03/21/22 0935 03/22/22 0648 03/23/22 0326  NA 141 139 140  K 4.6 3.6 3.8  CL 108 111 111  CO2 21* 22 21*  GLUCOSE 103* 82 125*  BUN 30* 17 18  CREATININE 1.02* 0.86 0.77  CALCIUM 8.8* 8.4* 8.0*   Liver Function Tests: Recent Labs  Lab 03/21/22 0935  AST 27  ALT 16  ALKPHOS 55  BILITOT 0.6  PROT 6.4*  ALBUMIN 2.3*    CBC: Recent Labs  Lab 03/21/22 0935 03/22/22 0648 03/23/22 0326 03/24/22 0555  WBC 13.8* 10.6* 11.2* 9.4  HGB 7.4* 6.1* 8.7* 8.9*  HCT 24.5* 19.1* 25.9* 27.7*  MCV 90.4 84.9 83.5 85.8  PLT 445* 365 336 346   Cardiac Enzymes: Recent Labs  Lab 03/21/22 0935  CKTOTAL 164    IMAGING STUDIES CT Head Wo Contrast  Result Date: 03/21/2022 CLINICAL DATA:  Status  post fall.  Found down. EXAM: CT HEAD WITHOUT CONTRAST CT CERVICAL SPINE WITHOUT CONTRAST TECHNIQUE: Multidetector CT imaging of the head and cervical spine was performed following the standard protocol without intravenous contrast. Multiplanar CT image reconstructions of the cervical spine were also generated. RADIATION DOSE REDUCTION: This exam was performed according to the departmental dose-optimization program which includes automated exposure control, adjustment of the mA and/or kV according to patient size and/or use of iterative reconstruction technique. COMPARISON:  None. FINDINGS: CT HEAD FINDINGS Brain: There is no evidence for acute hemorrhage, hydrocephalus, mass lesion, or abnormal extra-axial fluid collection. No definite CT evidence for acute infarction. Patchy low attenuation in the deep hemispheric and periventricular white matter is nonspecific, but likely reflects chronic microvascular ischemic demyelination. Vascular: No hyperdense vessel or unexpected calcification. Skull: No evidence for fracture. No worrisome lytic or sclerotic lesion. Sinuses/Orbits: The visualized paranasal sinuses and mastoid air cells are clear. Visualized portions of the globes and intraorbital fat are unremarkable. Other: None. CT CERVICAL SPINE FINDINGS Alignment: Normal. Skull base and vertebrae: No acute fracture. No primary bone lesion or focal pathologic process. Soft tissues and spinal canal: No prevertebral fluid or swelling. No visible canal hematoma. Disc levels: Diffuse loss of  intervertebral disc height noted in the cervical spine without evidence for traumatic subluxation. Right-sided C2-3 and C4-5 facets are fused. Left-sided facets are fused at C2-3, C3-4, C4-5. Upper chest: Unremarkable. Other: None. IMPRESSION: 1. No acute intracranial abnormality. 2. Chronic small vessel ischemic disease. 3. No evidence for cervical spine fracture or traumatic subluxation. 4. Diffuse degenerative changes in the cervical  spine as above. Electronically Signed   By: Misty Stanley M.D.   On: 03/21/2022 11:13   CT Cervical Spine Wo Contrast  Result Date: 03/21/2022 CLINICAL DATA:  Status post fall.  Found down. EXAM: CT HEAD WITHOUT CONTRAST CT CERVICAL SPINE WITHOUT CONTRAST TECHNIQUE: Multidetector CT imaging of the head and cervical spine was performed following the standard protocol without intravenous contrast. Multiplanar CT image reconstructions of the cervical spine were also generated. RADIATION DOSE REDUCTION: This exam was performed according to the departmental dose-optimization program which includes automated exposure control, adjustment of the mA and/or kV according to patient size and/or use of iterative reconstruction technique. COMPARISON:  None. FINDINGS: CT HEAD FINDINGS Brain: There is no evidence for acute hemorrhage, hydrocephalus, mass lesion, or abnormal extra-axial fluid collection. No definite CT evidence for acute infarction. Patchy low attenuation in the deep hemispheric and periventricular white matter is nonspecific, but likely reflects chronic microvascular ischemic demyelination. Vascular: No hyperdense vessel or unexpected calcification. Skull: No evidence for fracture. No worrisome lytic or sclerotic lesion. Sinuses/Orbits: The visualized paranasal sinuses and mastoid air cells are clear. Visualized portions of the globes and intraorbital fat are unremarkable. Other: None. CT CERVICAL SPINE FINDINGS Alignment: Normal. Skull base and vertebrae: No acute fracture. No primary bone lesion or focal pathologic process. Soft tissues and spinal canal: No prevertebral fluid or swelling. No visible canal hematoma. Disc levels: Diffuse loss of intervertebral disc height noted in the cervical spine without evidence for traumatic subluxation. Right-sided C2-3 and C4-5 facets are fused. Left-sided facets are fused at C2-3, C3-4, C4-5. Upper chest: Unremarkable. Other: None. IMPRESSION: 1. No acute intracranial  abnormality. 2. Chronic small vessel ischemic disease. 3. No evidence for cervical spine fracture or traumatic subluxation. 4. Diffuse degenerative changes in the cervical spine as above. Electronically Signed   By: Misty Stanley M.D.   On: 03/21/2022 11:13   DG Hip Unilat W or Wo Pelvis 2-3 Views Left  Result Date: 03/21/2022 CLINICAL DATA:  88 year old female status post fall 3 days ago, found down. Altered mental status and pain. EXAM: DG HIP (WITH OR WITHOUT PELVIS) 2-3V LEFT COMPARISON:  Right hip series today reported separately. FINDINGS: AP pelvis and left hip series at 1044 hours. Femoral heads are normally located. Bone mineralization is within normal limits for age. No pelvis fracture identified. Left lower quadrant, left pelvic surgical clips. Negative visible bowel gas. Hip joint spaces appear symmetric and fairly normal for age. Bilateral acetabular degenerative spurring. Proximal right femur is detailed separately. Proximal left femur appears intact. IMPRESSION: 1. No acute fracture or dislocation identified about the left hip or pelvis. 2. Right hip reported separately. Electronically Signed   By: Genevie Ann M.D.   On: 03/21/2022 11:09   DG Chest 1 View  Result Date: 03/21/2022 CLINICAL DATA:  88 year old female status post fall 3 days ago, found down. Altered mental status and pain. EXAM: CHEST  1 VIEW COMPARISON:  Chest radiograph 12/23/2021 and earlier. FINDINGS: Portable supine view at 1054 hours. Mildly lower lung volumes. Stable mild tortuosity of the thoracic aorta. Other mediastinal contours are within normal limits. Visualized tracheal air  column is within normal limits. Allowing for portable technique the lungs are clear. No pneumothorax or pleural effusion identified on this supine view. Stable visualized osseous structures. Negative visible bowel gas. IMPRESSION: No acute cardiopulmonary abnormality or acute traumatic injury identified. Electronically Signed   By: Genevie Ann M.D.    On: 03/21/2022 11:06   DG Knee Complete 4 Views Left  Result Date: 03/21/2022 CLINICAL DATA:  88 year old female status post fall 3 days ago, found down. Altered mental status and pain. EXAM: LEFT KNEE - COMPLETE 4+ VIEW COMPARISON:  Left tib fib series 12/31/2016. FINDINGS: Bulky chronic tricompartmental left knee joint degeneration. Bulky osteophytosis. No definite joint effusion. Patella appears intact. No acute osseous abnormality identified. Some generalized soft tissue swelling. IMPRESSION: Bulky chronic tricompartmental left knee joint degeneration. No acute fracture or dislocation identified. Electronically Signed   By: Genevie Ann M.D.   On: 03/21/2022 11:05   DG Knee Complete 4 Views Right  Result Date: 03/21/2022 CLINICAL DATA:  88 year old female status post fall 3 days ago, found down. Altered mental status and pain. EXAM: RIGHT KNEE - COMPLETE 4+ VIEW COMPARISON:  None Available. FINDINGS: Right total knee arthroplasty. Hardware appears intact and aligned. No acute osseous abnormality identified. Patella appears intact with some postoperative and degenerative changes. No definite joint effusion on the cross-table lateral view. IMPRESSION: Right total knee arthroplasty with no acute fracture or dislocation identified about the right knee. Electronically Signed   By: Genevie Ann M.D.   On: 03/21/2022 11:04   DG Shoulder Left  Result Date: 03/21/2022 CLINICAL DATA:  88 year old female status post fall 3 days ago, found down. Altered mental status and pain. EXAM: LEFT SHOULDER - 2+ VIEW COMPARISON:  Contralateral left shoulder today. Left shoulder series 12/23/2021. FINDINGS: Very severe chronic left glenohumeral joint space loss with bulky osteophytosis and calcified loose bodies around the joint space. No glenohumeral joint dislocation. Proximal left humerus appears stable and intact. Less pronounced moderate left AC joint degenerative spurring. Left clavicle and scapula appears stable and intact.  Visible left chest and ribs appears stable and intact. IMPRESSION: Chronic very severe degeneration with no acute fracture or dislocation identified about the left shoulder. Electronically Signed   By: Genevie Ann M.D.   On: 03/21/2022 10:58   DG Shoulder Right  Result Date: 03/21/2022 CLINICAL DATA:  88 year old female status post fall 3 days ago, found down. Altered mental status and pain. EXAM: RIGHT SHOULDER - 2+ VIEW COMPARISON:  Chest radiographs 12/23/2021. FINDINGS: Severe glenohumeral joint space loss with degenerative spurring. No glenohumeral joint dislocation. Proximal right humerus appears intact with mild degenerative calcification near the insertion of the rotator cuff. Moderate to severe AC joint space loss with degenerative spurring. Right clavicle and scapula appear intact. Visible right ribs and chest appear stable, negative. IMPRESSION: Advanced degenerative changes at the right shoulder but no acute fracture or dislocation identified. Electronically Signed   By: Genevie Ann M.D.   On: 03/21/2022 10:57   DG Hip Unilat W or Wo Pelvis 2-3 Views Right  Result Date: 03/21/2022 CLINICAL DATA:  [place] 3 days ago, on floor 10 hours, altered mental status, pain EXAM: DG HIP (WITH OR WITHOUT PELVIS) 2-3V RIGHT COMPARISON:  None Available. FINDINGS: Osseous mineralization normal. RIGHT hip and SI joint spaces preserved. No acute fracture, dislocation, or bone destruction. IMPRESSION: No acute abnormalities. Electronically Signed   By: Lavonia Dana M.D.   On: 03/21/2022 10:56    DISCHARGE EXAMINATION: Vitals:   03/23/22 0355 03/23/22  8921 03/23/22 1537 03/24/22 0757  BP: 120/69 126/78 (!) 125/59 113/60  Pulse: 80 82 94 77  Resp: '16 15 15 16  '$ Temp: 98.4 F (36.9 C) 98.9 F (37.2 C) 98.9 F (37.2 C) 98.4 F (36.9 C)  TempSrc: Oral Oral Oral Oral  SpO2: 100% 100% 100% 100%   General appearance: Awake alert.  In no distress Resp: Clear to auscultation bilaterally.  Normal effort Cardio: S1-S2  is normal regular.  No S3-S4.  No rubs murmurs or bruit GI: Abdomen is soft.  Nontender nondistended.  Bowel sounds are present normal.  No masses organomegaly  DISPOSITION: home  Discharge Instructions     Call MD for:  difficulty breathing, headache or visual disturbances   Complete by: As directed    Call MD for:  extreme fatigue   Complete by: As directed    Call MD for:  persistant dizziness or light-headedness   Complete by: As directed    Call MD for:  persistant nausea and vomiting   Complete by: As directed    Call MD for:  severe uncontrolled pain   Complete by: As directed    Call MD for:  temperature >100.4   Complete by: As directed    Diet - low sodium heart healthy   Complete by: As directed    Discharge instructions   Complete by: As directed    Please take your medications as prescribed.  Please reach out to hospice if you develop any new symptoms.  Seek attention if you feel dizzy lightheaded or if you note significant bleeding from your vaginal area.  You were cared for by a hospitalist during your hospital stay. If you have any questions about your discharge medications or the care you received while you were in the hospital after you are discharged, you can call the unit and asked to speak with the hospitalist on call if the hospitalist that took care of you is not available. Once you are discharged, your primary care physician will handle any further medical issues. Please note that NO REFILLS for any discharge medications will be authorized once you are discharged, as it is imperative that you return to your primary care physician (or establish a relationship with a primary care physician if you do not have one) for your aftercare needs so that they can reassess your need for medications and monitor your lab values. If you do not have a primary care physician, you can call [phone] for a physician referral.   Increase activity slowly   Complete by: As directed    No  wound care   Complete by: As directed           Allergies as of 03/24/2022   No Known Allergies      Medication List     TAKE these medications    cephALEXin 500 MG capsule Commonly known as: KEFLEX Take 1 capsule (500 mg total) by mouth every 8 (eight) hours for 5 days.   diclofenac 25 MG EC tablet Commonly known as: VOLTAREN TAKE 1 TABLET BY MOUTH TWICE A DAY AS NEEDED What changed: reasons to take this   diclofenac Sodium 1 % Gel Commonly known as: VOLTAREN Apply 2 g topically 4 (four) times daily.   Ensure Take 237 mLs by mouth daily with breakfast.   fluticasone 50 MCG/ACT nasal spray Commonly known as: FLONASE Place 1 spray into both nostrils at bedtime as needed for allergies or rhinitis.   furosemide 20 MG tablet Commonly  known as: LASIX Take 0.5-1 tablets (10-20 mg total) by mouth daily as needed. What changed: reasons to take this   lidocaine 5 % Commonly known as: Lidoderm Place 1 patch onto the skin daily. Remove & Discard patch within 12 hours or as directed by MD   Melatonin 5 MG Caps Take 1 capsule by mouth at bedtime.   senna 8.6 MG Tabs tablet Commonly known as: SENOKOT Take 2 tablets by mouth daily.   Trospium Chloride 60 MG Cp24 TAKE 1 CAPSULE BY MOUTH EVERY DAY          Follow-up Information     [facility] Follow up.   Specialty: Hospice and Palliative Medicine Contact information: [place] [phone]                TOTAL DISCHARGE TIME: 50 mins  [place]  [facility] Pager on [email or web address]  03/24/2022, 1:14 PM

## 2022-03-24 NOTE — TOC Transition Note (Signed)
Transition of Care [facility]) - CM/SW Discharge Note   Patient Details  Name: Ariah Mower MRN: [id number] Date of Birth: 02/17/1928  Transition of [facility]) CM/SW Contact:  Curlene Labrum, RN Phone Number: 03/24/2022, 11:50 AM   Clinical Narrative:    CM met with the patient and family prior to patient's discharge to home and updated them that [facility] services MSW will follow up with the patient and the family so that they can assist with future LTC placement at a nursing facility of choice.  I gave the family a Medicare choice list to assist while they were exploring options for placement.  The patient will be discharging home by car.   Final next level of care: Home w Hospice Care Barriers to Discharge: Continued Medical Work up   Patient Goals and CMS Choice Patient states their goals for this hospitalization and ongoing recovery are:: To return home and look for LTC facility at a later date CMS Medicare.gov Compare Post Acute Care list provided to:: Patient Choice offered to / list presented to : Patient  Discharge Placement                       Discharge Plan and Services   Discharge Planning Services: CM Consult                                 Social Determinants of Health (SDOH) Interventions     Readmission Risk Interventions    03/23/2022   11:23 AM  Readmission Risk Prevention Plan  Post Dischage Appt Complete  Medication Screening Complete  Transportation Screening Complete

## 2022-03-24 NOTE — Plan of Care (Signed)
  Problem: Education: Goal: Knowledge of General Education information will improve Description: Including pain rating scale, medication(s)/side effects and non-pharmacologic comfort measures Outcome: Not Progressing   Problem: Health Behavior/Discharge Planning: Goal: Ability to manage health-related needs will improve Outcome: Not Progressing   Problem: Clinical Measurements: Goal: Ability to maintain clinical measurements within normal limits will improve Outcome: Progressing Goal: Will remain free from infection Outcome: Progressing Goal: Diagnostic test results will improve Outcome: Progressing Goal: Respiratory complications will improve Outcome: Progressing Goal: Cardiovascular complication will be avoided Outcome: Progressing   Problem: Activity: Goal: Risk for activity intolerance will decrease Outcome: Not Progressing   Problem: Nutrition: Goal: Adequate nutrition will be maintained Outcome: Progressing   Problem: Elimination: Goal: Will not experience complications related to bowel motility Outcome: Progressing Goal: Will not experience complications related to urinary retention Outcome: Progressing   Problem: Pain Managment: Goal: General experience of comfort will improve Outcome: Progressing   Problem: Safety: Goal: Ability to remain free from injury will improve Outcome: Progressing

## 2022-03-25 DIAGNOSIS — I10 Essential (primary) hypertension: Secondary | ICD-10-CM | POA: Diagnosis not present

## 2022-03-25 DIAGNOSIS — I70209 Unspecified atherosclerosis of native arteries of extremities, unspecified extremity: Secondary | ICD-10-CM | POA: Diagnosis not present

## 2022-03-25 DIAGNOSIS — M1990 Unspecified osteoarthritis, unspecified site: Secondary | ICD-10-CM | POA: Diagnosis not present

## 2022-03-25 DIAGNOSIS — C52 Malignant neoplasm of vagina: Secondary | ICD-10-CM | POA: Diagnosis not present

## 2022-03-25 DIAGNOSIS — I700 Atherosclerosis of aorta: Secondary | ICD-10-CM | POA: Diagnosis not present

## 2022-03-25 DIAGNOSIS — F0150 Vascular dementia without behavioral disturbance: Secondary | ICD-10-CM | POA: Diagnosis not present

## 2022-03-27 ENCOUNTER — Telehealth: Payer: Self-pay

## 2022-03-27 DIAGNOSIS — M1990 Unspecified osteoarthritis, unspecified site: Secondary | ICD-10-CM | POA: Diagnosis not present

## 2022-03-27 DIAGNOSIS — F0150 Vascular dementia without behavioral disturbance: Secondary | ICD-10-CM | POA: Diagnosis not present

## 2022-03-27 DIAGNOSIS — I70209 Unspecified atherosclerosis of native arteries of extremities, unspecified extremity: Secondary | ICD-10-CM | POA: Diagnosis not present

## 2022-03-27 DIAGNOSIS — I700 Atherosclerosis of aorta: Secondary | ICD-10-CM | POA: Diagnosis not present

## 2022-03-27 DIAGNOSIS — C52 Malignant neoplasm of vagina: Secondary | ICD-10-CM | POA: Diagnosis not present

## 2022-03-27 DIAGNOSIS — I10 Essential (primary) hypertension: Secondary | ICD-10-CM | POA: Diagnosis not present

## 2022-03-27 NOTE — Telephone Encounter (Signed)
I called and spoke with patient's daughter. Patient is currently under hospice care, so hospice provider will fill out FL2 for nursing home.

## 2022-03-27 NOTE — Telephone Encounter (Signed)
Per Daughter Mia Simpson- patient is under hospice care and the social worker with hospice advised her that hospice would take care of placement

## 2022-03-28 ENCOUNTER — Telehealth: Payer: Self-pay | Admitting: Family Medicine

## 2022-03-28 DIAGNOSIS — I70209 Unspecified atherosclerosis of native arteries of extremities, unspecified extremity: Secondary | ICD-10-CM | POA: Diagnosis not present

## 2022-03-28 DIAGNOSIS — M1990 Unspecified osteoarthritis, unspecified site: Secondary | ICD-10-CM | POA: Diagnosis not present

## 2022-03-28 DIAGNOSIS — C52 Malignant neoplasm of vagina: Secondary | ICD-10-CM | POA: Diagnosis not present

## 2022-03-28 DIAGNOSIS — I10 Essential (primary) hypertension: Secondary | ICD-10-CM | POA: Diagnosis not present

## 2022-03-28 DIAGNOSIS — I700 Atherosclerosis of aorta: Secondary | ICD-10-CM | POA: Diagnosis not present

## 2022-03-28 DIAGNOSIS — F0150 Vascular dementia without behavioral disturbance: Secondary | ICD-10-CM | POA: Diagnosis not present

## 2022-03-28 NOTE — Telephone Encounter (Signed)
I spoke with patient's daughter. She is going to bring the form by this afternoon; I will be out this afternoon, but return at 7am tomorrow. Advised her I would get it filled out & have PCP sign it so we can get it back to her tomorrow.

## 2022-03-28 NOTE — Telephone Encounter (Signed)
Patient's daughter says there is paperwork for the patient that needs to be filled out asap. Asked if she could come and drop it off and get it right back. Advised the provider may not be able to stop what they are doing and fill it out right then and there may be a wait. Requested a call from care team to discuss

## 2022-03-29 DIAGNOSIS — I700 Atherosclerosis of aorta: Secondary | ICD-10-CM | POA: Diagnosis not present

## 2022-03-29 DIAGNOSIS — I70209 Unspecified atherosclerosis of native arteries of extremities, unspecified extremity: Secondary | ICD-10-CM | POA: Diagnosis not present

## 2022-03-29 DIAGNOSIS — C52 Malignant neoplasm of vagina: Secondary | ICD-10-CM | POA: Diagnosis not present

## 2022-03-29 DIAGNOSIS — D329 Benign neoplasm of meninges, unspecified: Secondary | ICD-10-CM | POA: Diagnosis not present

## 2022-03-29 DIAGNOSIS — N3281 Overactive bladder: Secondary | ICD-10-CM | POA: Diagnosis not present

## 2022-03-29 DIAGNOSIS — I10 Essential (primary) hypertension: Secondary | ICD-10-CM | POA: Diagnosis not present

## 2022-03-29 DIAGNOSIS — Z9221 Personal history of antineoplastic chemotherapy: Secondary | ICD-10-CM | POA: Diagnosis not present

## 2022-03-29 DIAGNOSIS — Z923 Personal history of irradiation: Secondary | ICD-10-CM | POA: Diagnosis not present

## 2022-03-29 DIAGNOSIS — F0150 Vascular dementia without behavioral disturbance: Secondary | ICD-10-CM | POA: Diagnosis not present

## 2022-03-29 DIAGNOSIS — J302 Other seasonal allergic rhinitis: Secondary | ICD-10-CM | POA: Diagnosis not present

## 2022-03-29 DIAGNOSIS — M1990 Unspecified osteoarthritis, unspecified site: Secondary | ICD-10-CM | POA: Diagnosis not present

## 2022-03-29 DIAGNOSIS — I739 Peripheral vascular disease, unspecified: Secondary | ICD-10-CM | POA: Diagnosis not present

## 2022-03-30 DIAGNOSIS — C52 Malignant neoplasm of vagina: Secondary | ICD-10-CM | POA: Diagnosis not present

## 2022-03-30 DIAGNOSIS — F0150 Vascular dementia without behavioral disturbance: Secondary | ICD-10-CM | POA: Diagnosis not present

## 2022-03-30 DIAGNOSIS — I700 Atherosclerosis of aorta: Secondary | ICD-10-CM | POA: Diagnosis not present

## 2022-03-30 DIAGNOSIS — I10 Essential (primary) hypertension: Secondary | ICD-10-CM | POA: Diagnosis not present

## 2022-03-30 DIAGNOSIS — M1990 Unspecified osteoarthritis, unspecified site: Secondary | ICD-10-CM | POA: Diagnosis not present

## 2022-03-30 DIAGNOSIS — I70209 Unspecified atherosclerosis of native arteries of extremities, unspecified extremity: Secondary | ICD-10-CM | POA: Diagnosis not present

## 2022-04-05 DIAGNOSIS — I700 Atherosclerosis of aorta: Secondary | ICD-10-CM | POA: Diagnosis not present

## 2022-04-05 DIAGNOSIS — F0150 Vascular dementia without behavioral disturbance: Secondary | ICD-10-CM | POA: Diagnosis not present

## 2022-04-05 DIAGNOSIS — I10 Essential (primary) hypertension: Secondary | ICD-10-CM | POA: Diagnosis not present

## 2022-04-05 DIAGNOSIS — M1990 Unspecified osteoarthritis, unspecified site: Secondary | ICD-10-CM | POA: Diagnosis not present

## 2022-04-05 DIAGNOSIS — C52 Malignant neoplasm of vagina: Secondary | ICD-10-CM | POA: Diagnosis not present

## 2022-04-05 DIAGNOSIS — I70209 Unspecified atherosclerosis of native arteries of extremities, unspecified extremity: Secondary | ICD-10-CM | POA: Diagnosis not present

## 2022-04-06 DIAGNOSIS — F0150 Vascular dementia without behavioral disturbance: Secondary | ICD-10-CM | POA: Diagnosis not present

## 2022-04-06 DIAGNOSIS — C52 Malignant neoplasm of vagina: Secondary | ICD-10-CM | POA: Diagnosis not present

## 2022-04-06 DIAGNOSIS — I70209 Unspecified atherosclerosis of native arteries of extremities, unspecified extremity: Secondary | ICD-10-CM | POA: Diagnosis not present

## 2022-04-06 DIAGNOSIS — I10 Essential (primary) hypertension: Secondary | ICD-10-CM | POA: Diagnosis not present

## 2022-04-06 DIAGNOSIS — I700 Atherosclerosis of aorta: Secondary | ICD-10-CM | POA: Diagnosis not present

## 2022-04-06 DIAGNOSIS — M1990 Unspecified osteoarthritis, unspecified site: Secondary | ICD-10-CM | POA: Diagnosis not present

## 2022-04-10 DIAGNOSIS — C52 Malignant neoplasm of vagina: Secondary | ICD-10-CM | POA: Diagnosis not present

## 2022-04-10 DIAGNOSIS — I10 Essential (primary) hypertension: Secondary | ICD-10-CM | POA: Diagnosis not present

## 2022-04-10 DIAGNOSIS — I700 Atherosclerosis of aorta: Secondary | ICD-10-CM | POA: Diagnosis not present

## 2022-04-10 DIAGNOSIS — F0150 Vascular dementia without behavioral disturbance: Secondary | ICD-10-CM | POA: Diagnosis not present

## 2022-04-10 DIAGNOSIS — M1990 Unspecified osteoarthritis, unspecified site: Secondary | ICD-10-CM | POA: Diagnosis not present

## 2022-04-10 DIAGNOSIS — I70209 Unspecified atherosclerosis of native arteries of extremities, unspecified extremity: Secondary | ICD-10-CM | POA: Diagnosis not present

## 2022-04-11 DIAGNOSIS — I70209 Unspecified atherosclerosis of native arteries of extremities, unspecified extremity: Secondary | ICD-10-CM | POA: Diagnosis not present

## 2022-04-11 DIAGNOSIS — M1990 Unspecified osteoarthritis, unspecified site: Secondary | ICD-10-CM | POA: Diagnosis not present

## 2022-04-11 DIAGNOSIS — C52 Malignant neoplasm of vagina: Secondary | ICD-10-CM | POA: Diagnosis not present

## 2022-04-11 DIAGNOSIS — I700 Atherosclerosis of aorta: Secondary | ICD-10-CM | POA: Diagnosis not present

## 2022-04-11 DIAGNOSIS — F0150 Vascular dementia without behavioral disturbance: Secondary | ICD-10-CM | POA: Diagnosis not present

## 2022-04-11 DIAGNOSIS — I10 Essential (primary) hypertension: Secondary | ICD-10-CM | POA: Diagnosis not present

## 2022-04-12 DIAGNOSIS — C52 Malignant neoplasm of vagina: Secondary | ICD-10-CM | POA: Diagnosis not present

## 2022-04-12 DIAGNOSIS — F0150 Vascular dementia without behavioral disturbance: Secondary | ICD-10-CM | POA: Diagnosis not present

## 2022-04-12 DIAGNOSIS — M1990 Unspecified osteoarthritis, unspecified site: Secondary | ICD-10-CM | POA: Diagnosis not present

## 2022-04-12 DIAGNOSIS — I70209 Unspecified atherosclerosis of native arteries of extremities, unspecified extremity: Secondary | ICD-10-CM | POA: Diagnosis not present

## 2022-04-12 DIAGNOSIS — I10 Essential (primary) hypertension: Secondary | ICD-10-CM | POA: Diagnosis not present

## 2022-04-12 DIAGNOSIS — I700 Atherosclerosis of aorta: Secondary | ICD-10-CM | POA: Diagnosis not present

## 2022-04-17 DIAGNOSIS — I70209 Unspecified atherosclerosis of native arteries of extremities, unspecified extremity: Secondary | ICD-10-CM | POA: Diagnosis not present

## 2022-04-17 DIAGNOSIS — I10 Essential (primary) hypertension: Secondary | ICD-10-CM | POA: Diagnosis not present

## 2022-04-17 DIAGNOSIS — I700 Atherosclerosis of aorta: Secondary | ICD-10-CM | POA: Diagnosis not present

## 2022-04-17 DIAGNOSIS — C52 Malignant neoplasm of vagina: Secondary | ICD-10-CM | POA: Diagnosis not present

## 2022-04-17 DIAGNOSIS — F0150 Vascular dementia without behavioral disturbance: Secondary | ICD-10-CM | POA: Diagnosis not present

## 2022-04-17 DIAGNOSIS — M1990 Unspecified osteoarthritis, unspecified site: Secondary | ICD-10-CM | POA: Diagnosis not present

## 2022-04-18 DIAGNOSIS — I700 Atherosclerosis of aorta: Secondary | ICD-10-CM | POA: Diagnosis not present

## 2022-04-18 DIAGNOSIS — F0150 Vascular dementia without behavioral disturbance: Secondary | ICD-10-CM | POA: Diagnosis not present

## 2022-04-18 DIAGNOSIS — M1990 Unspecified osteoarthritis, unspecified site: Secondary | ICD-10-CM | POA: Diagnosis not present

## 2022-04-18 DIAGNOSIS — I70209 Unspecified atherosclerosis of native arteries of extremities, unspecified extremity: Secondary | ICD-10-CM | POA: Diagnosis not present

## 2022-04-18 DIAGNOSIS — C52 Malignant neoplasm of vagina: Secondary | ICD-10-CM | POA: Diagnosis not present

## 2022-04-18 DIAGNOSIS — I10 Essential (primary) hypertension: Secondary | ICD-10-CM | POA: Diagnosis not present

## 2022-04-19 DIAGNOSIS — I700 Atherosclerosis of aorta: Secondary | ICD-10-CM | POA: Diagnosis not present

## 2022-04-19 DIAGNOSIS — F0150 Vascular dementia without behavioral disturbance: Secondary | ICD-10-CM | POA: Diagnosis not present

## 2022-04-19 DIAGNOSIS — C52 Malignant neoplasm of vagina: Secondary | ICD-10-CM | POA: Diagnosis not present

## 2022-04-19 DIAGNOSIS — I10 Essential (primary) hypertension: Secondary | ICD-10-CM | POA: Diagnosis not present

## 2022-04-19 DIAGNOSIS — I70209 Unspecified atherosclerosis of native arteries of extremities, unspecified extremity: Secondary | ICD-10-CM | POA: Diagnosis not present

## 2022-04-19 DIAGNOSIS — M1990 Unspecified osteoarthritis, unspecified site: Secondary | ICD-10-CM | POA: Diagnosis not present

## 2022-04-21 DIAGNOSIS — M1990 Unspecified osteoarthritis, unspecified site: Secondary | ICD-10-CM | POA: Diagnosis not present

## 2022-04-21 DIAGNOSIS — I10 Essential (primary) hypertension: Secondary | ICD-10-CM | POA: Diagnosis not present

## 2022-04-21 DIAGNOSIS — C52 Malignant neoplasm of vagina: Secondary | ICD-10-CM | POA: Diagnosis not present

## 2022-04-21 DIAGNOSIS — I700 Atherosclerosis of aorta: Secondary | ICD-10-CM | POA: Diagnosis not present

## 2022-04-21 DIAGNOSIS — I70209 Unspecified atherosclerosis of native arteries of extremities, unspecified extremity: Secondary | ICD-10-CM | POA: Diagnosis not present

## 2022-04-21 DIAGNOSIS — F0150 Vascular dementia without behavioral disturbance: Secondary | ICD-10-CM | POA: Diagnosis not present

## 2022-04-22 DIAGNOSIS — F0150 Vascular dementia without behavioral disturbance: Secondary | ICD-10-CM | POA: Diagnosis not present

## 2022-04-22 DIAGNOSIS — C52 Malignant neoplasm of vagina: Secondary | ICD-10-CM | POA: Diagnosis not present

## 2022-04-22 DIAGNOSIS — I70209 Unspecified atherosclerosis of native arteries of extremities, unspecified extremity: Secondary | ICD-10-CM | POA: Diagnosis not present

## 2022-04-22 DIAGNOSIS — I700 Atherosclerosis of aorta: Secondary | ICD-10-CM | POA: Diagnosis not present

## 2022-04-22 DIAGNOSIS — I10 Essential (primary) hypertension: Secondary | ICD-10-CM | POA: Diagnosis not present

## 2022-04-22 DIAGNOSIS — M1990 Unspecified osteoarthritis, unspecified site: Secondary | ICD-10-CM | POA: Diagnosis not present

## 2022-04-22 IMAGING — PT NM PET TUM IMG RESTAG (PS) SKULL BASE T - THIGH
1 series · 6 of 6 positions shown · non-contrast
Comparison: Multiple exams, including PET-CT from 08/04/2020

CLINICAL DATA: Subsequent treatment strategy for poorly
differentiated vaginal malignancy.

EXAM:
NUCLEAR MEDICINE PET SKULL BASE TO THIGH
TECHNIQUE: 10.8 mCi F-18 FDG was injected intravenously. Full-ring PET imaging
was performed from the skull base to thigh after the radiotracer. CT
data was obtained and used for attenuation correction and anatomic
localization.
Fasting blood glucose: 105 mg/dl

[Series 9023: results mm oncology reading · 5.0mm · 0.88mm/px · 6 of 6 slices shown]
[im 1/6]
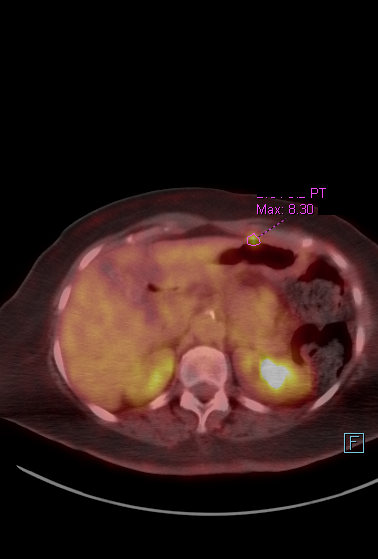
[im 2/6]
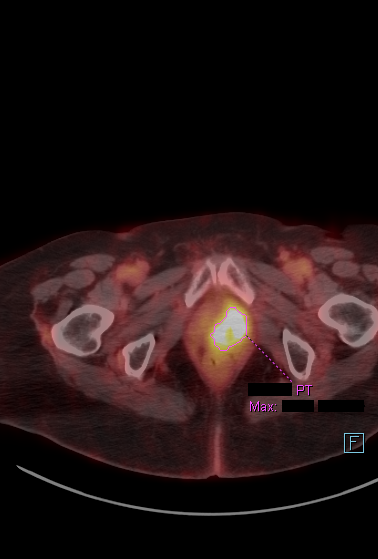
[im 3/6]
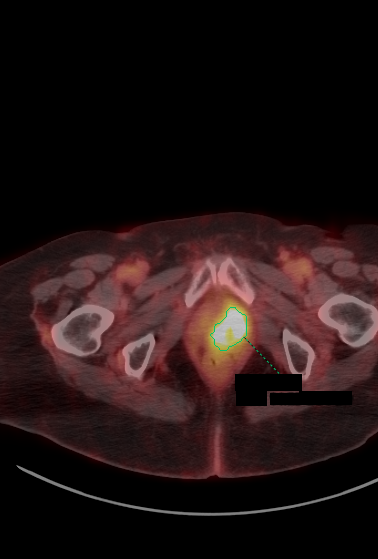
[im 4/6]
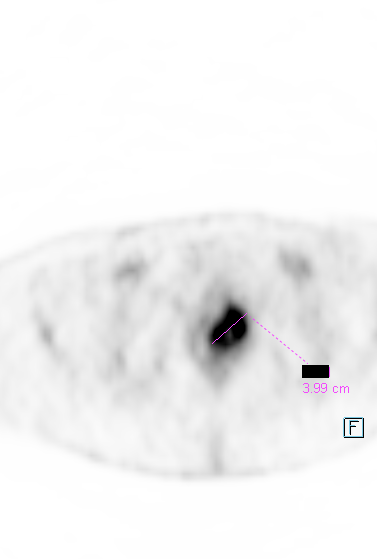
[im 5/6]
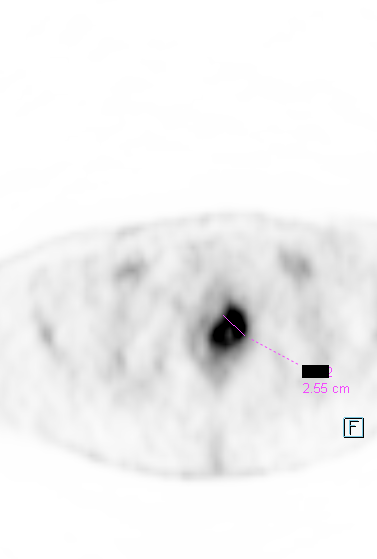
[im 6/6]
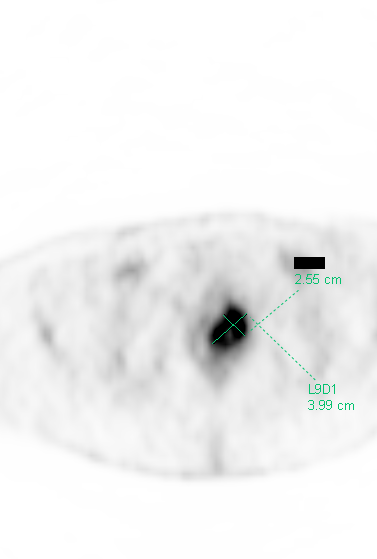

[6 of 6 positions shown; findings below may reference images not displayed]

FINDINGS: Mediastinal blood pool activity: SUV max

Liver activity: SUV max NA

NECK: Stable symmetric activity along the posterior margin of the
glottis/pharyngo esophageal junction, likely physiologic.

Incidental CT findings: Mild bilateral common carotid
atherosclerotic calcification.

CHEST: No significant abnormal hypermetabolic activity in this
region.

Incidental CT findings: Coronary, aortic arch, and branch vessel
atherosclerotic vascular disease.

ABDOMEN/PELVIS: Small (sub-centimeter) nodule along the anterior
gastric wall is somewhat blurred by motion on the CT data but
demonstrates increased metabolic activity compared to previous,
maximum SUV 8.3 (previously 4.6). Variable conspicuity on other
prior exams including the 06/13/2019 exam. Given the persistence at
this location, this seems unlikely to simply be physiologic activity
and raises the possibility of a small polyp or conceivably a tumor
implant in the vicinity of the anterior gastric wall immediately
adjacent to the border of the left hepatic lobe.

Abnormal soft tissue fullness in the vicinity of the vagina persists
with left eccentric vaginal activity with maximum SUV of 14.2,
previously 15.8. The region of abnormal activity measures
approximately 4.0 by 2.6 cm, formerly 3.5 by 2.[address] findings: Aortoiliac atherosclerotic vascular disease.
Cholelithiasis. Prominent stool throughout the colon favors
constipation.

SKELETON: Activity along the left glenohumeral joint is attributed
to benign arthropathy.

Incidental CT findings: Lumbar spondylosis and degenerative disc
disease. Thoracic kyphosis. Deformity from old sternal fracture.
IMPRESSION: 1. Mild increase in size and activity of the left eccentric vaginal
mass.
2. Mild increase in activity of a small sub-centimeter focus of
hypermetabolic activity along the anterior gastric wall. This is of
uncertain significance but could reflect a small gastric polyp,
tumor, or a tumor implant along the gastric wall.
3. Other imaging findings of potential clinical significance: Aortic
Atherosclerosis ([id number]). Coronary atherosclerosis.
Cholelithiasis. Prominent stool throughout the colon favors
constipation. Lumbar spondylosis and degenerative disc disease.

## 2022-04-23 DIAGNOSIS — I70209 Unspecified atherosclerosis of native arteries of extremities, unspecified extremity: Secondary | ICD-10-CM | POA: Diagnosis not present

## 2022-04-23 DIAGNOSIS — C52 Malignant neoplasm of vagina: Secondary | ICD-10-CM | POA: Diagnosis not present

## 2022-04-23 DIAGNOSIS — F0150 Vascular dementia without behavioral disturbance: Secondary | ICD-10-CM | POA: Diagnosis not present

## 2022-04-23 DIAGNOSIS — I10 Essential (primary) hypertension: Secondary | ICD-10-CM | POA: Diagnosis not present

## 2022-04-23 DIAGNOSIS — I700 Atherosclerosis of aorta: Secondary | ICD-10-CM | POA: Diagnosis not present

## 2022-04-23 DIAGNOSIS — M1990 Unspecified osteoarthritis, unspecified site: Secondary | ICD-10-CM | POA: Diagnosis not present

## 2022-04-24 DIAGNOSIS — C52 Malignant neoplasm of vagina: Secondary | ICD-10-CM | POA: Diagnosis not present

## 2022-04-24 DIAGNOSIS — I70209 Unspecified atherosclerosis of native arteries of extremities, unspecified extremity: Secondary | ICD-10-CM | POA: Diagnosis not present

## 2022-04-24 DIAGNOSIS — I700 Atherosclerosis of aorta: Secondary | ICD-10-CM | POA: Diagnosis not present

## 2022-04-24 DIAGNOSIS — I10 Essential (primary) hypertension: Secondary | ICD-10-CM | POA: Diagnosis not present

## 2022-04-24 DIAGNOSIS — M1990 Unspecified osteoarthritis, unspecified site: Secondary | ICD-10-CM | POA: Diagnosis not present

## 2022-04-24 DIAGNOSIS — F0150 Vascular dementia without behavioral disturbance: Secondary | ICD-10-CM | POA: Diagnosis not present

## 2022-04-26 DIAGNOSIS — M1990 Unspecified osteoarthritis, unspecified site: Secondary | ICD-10-CM | POA: Diagnosis not present

## 2022-04-26 DIAGNOSIS — I700 Atherosclerosis of aorta: Secondary | ICD-10-CM | POA: Diagnosis not present

## 2022-04-26 DIAGNOSIS — F0150 Vascular dementia without behavioral disturbance: Secondary | ICD-10-CM | POA: Diagnosis not present

## 2022-04-26 DIAGNOSIS — I70209 Unspecified atherosclerosis of native arteries of extremities, unspecified extremity: Secondary | ICD-10-CM | POA: Diagnosis not present

## 2022-04-26 DIAGNOSIS — I10 Essential (primary) hypertension: Secondary | ICD-10-CM | POA: Diagnosis not present

## 2022-04-26 DIAGNOSIS — C52 Malignant neoplasm of vagina: Secondary | ICD-10-CM | POA: Diagnosis not present

## 2022-04-27 DIAGNOSIS — F0150 Vascular dementia without behavioral disturbance: Secondary | ICD-10-CM | POA: Diagnosis not present

## 2022-04-27 DIAGNOSIS — I10 Essential (primary) hypertension: Secondary | ICD-10-CM | POA: Diagnosis not present

## 2022-04-27 DIAGNOSIS — I700 Atherosclerosis of aorta: Secondary | ICD-10-CM | POA: Diagnosis not present

## 2022-04-27 DIAGNOSIS — I70209 Unspecified atherosclerosis of native arteries of extremities, unspecified extremity: Secondary | ICD-10-CM | POA: Diagnosis not present

## 2022-04-27 DIAGNOSIS — C52 Malignant neoplasm of vagina: Secondary | ICD-10-CM | POA: Diagnosis not present

## 2022-04-27 DIAGNOSIS — M1990 Unspecified osteoarthritis, unspecified site: Secondary | ICD-10-CM | POA: Diagnosis not present

## 2022-04-28 DIAGNOSIS — C52 Malignant neoplasm of vagina: Secondary | ICD-10-CM | POA: Diagnosis not present

## 2022-04-28 DIAGNOSIS — D329 Benign neoplasm of meninges, unspecified: Secondary | ICD-10-CM | POA: Diagnosis not present

## 2022-04-28 DIAGNOSIS — Z923 Personal history of irradiation: Secondary | ICD-10-CM | POA: Diagnosis not present

## 2022-04-28 DIAGNOSIS — N3281 Overactive bladder: Secondary | ICD-10-CM | POA: Diagnosis not present

## 2022-04-28 DIAGNOSIS — I70209 Unspecified atherosclerosis of native arteries of extremities, unspecified extremity: Secondary | ICD-10-CM | POA: Diagnosis not present

## 2022-04-28 DIAGNOSIS — I739 Peripheral vascular disease, unspecified: Secondary | ICD-10-CM | POA: Diagnosis not present

## 2022-04-28 DIAGNOSIS — M1990 Unspecified osteoarthritis, unspecified site: Secondary | ICD-10-CM | POA: Diagnosis not present

## 2022-04-28 DIAGNOSIS — I10 Essential (primary) hypertension: Secondary | ICD-10-CM | POA: Diagnosis not present

## 2022-04-28 DIAGNOSIS — J302 Other seasonal allergic rhinitis: Secondary | ICD-10-CM | POA: Diagnosis not present

## 2022-04-28 DIAGNOSIS — F0150 Vascular dementia without behavioral disturbance: Secondary | ICD-10-CM | POA: Diagnosis not present

## 2022-04-28 DIAGNOSIS — Z9221 Personal history of antineoplastic chemotherapy: Secondary | ICD-10-CM | POA: Diagnosis not present

## 2022-04-28 DIAGNOSIS — I700 Atherosclerosis of aorta: Secondary | ICD-10-CM | POA: Diagnosis not present

## 2022-05-01 DIAGNOSIS — M1990 Unspecified osteoarthritis, unspecified site: Secondary | ICD-10-CM | POA: Diagnosis not present

## 2022-05-01 DIAGNOSIS — I70209 Unspecified atherosclerosis of native arteries of extremities, unspecified extremity: Secondary | ICD-10-CM | POA: Diagnosis not present

## 2022-05-01 DIAGNOSIS — C52 Malignant neoplasm of vagina: Secondary | ICD-10-CM | POA: Diagnosis not present

## 2022-05-01 DIAGNOSIS — I700 Atherosclerosis of aorta: Secondary | ICD-10-CM | POA: Diagnosis not present

## 2022-05-01 DIAGNOSIS — F0150 Vascular dementia without behavioral disturbance: Secondary | ICD-10-CM | POA: Diagnosis not present

## 2022-05-01 DIAGNOSIS — I10 Essential (primary) hypertension: Secondary | ICD-10-CM | POA: Diagnosis not present

## 2022-05-03 DIAGNOSIS — M1990 Unspecified osteoarthritis, unspecified site: Secondary | ICD-10-CM | POA: Diagnosis not present

## 2022-05-03 DIAGNOSIS — C52 Malignant neoplasm of vagina: Secondary | ICD-10-CM | POA: Diagnosis not present

## 2022-05-03 DIAGNOSIS — F0150 Vascular dementia without behavioral disturbance: Secondary | ICD-10-CM | POA: Diagnosis not present

## 2022-05-03 DIAGNOSIS — I700 Atherosclerosis of aorta: Secondary | ICD-10-CM | POA: Diagnosis not present

## 2022-05-03 DIAGNOSIS — I70209 Unspecified atherosclerosis of native arteries of extremities, unspecified extremity: Secondary | ICD-10-CM | POA: Diagnosis not present

## 2022-05-03 DIAGNOSIS — I10 Essential (primary) hypertension: Secondary | ICD-10-CM | POA: Diagnosis not present

## 2022-05-04 DIAGNOSIS — I700 Atherosclerosis of aorta: Secondary | ICD-10-CM | POA: Diagnosis not present

## 2022-05-04 DIAGNOSIS — I10 Essential (primary) hypertension: Secondary | ICD-10-CM | POA: Diagnosis not present

## 2022-05-04 DIAGNOSIS — M1990 Unspecified osteoarthritis, unspecified site: Secondary | ICD-10-CM | POA: Diagnosis not present

## 2022-05-04 DIAGNOSIS — F0150 Vascular dementia without behavioral disturbance: Secondary | ICD-10-CM | POA: Diagnosis not present

## 2022-05-04 DIAGNOSIS — C52 Malignant neoplasm of vagina: Secondary | ICD-10-CM | POA: Diagnosis not present

## 2022-05-04 DIAGNOSIS — I70209 Unspecified atherosclerosis of native arteries of extremities, unspecified extremity: Secondary | ICD-10-CM | POA: Diagnosis not present

## 2022-05-05 DIAGNOSIS — C52 Malignant neoplasm of vagina: Secondary | ICD-10-CM | POA: Diagnosis not present

## 2022-05-05 DIAGNOSIS — I70209 Unspecified atherosclerosis of native arteries of extremities, unspecified extremity: Secondary | ICD-10-CM | POA: Diagnosis not present

## 2022-05-05 DIAGNOSIS — F0150 Vascular dementia without behavioral disturbance: Secondary | ICD-10-CM | POA: Diagnosis not present

## 2022-05-05 DIAGNOSIS — I10 Essential (primary) hypertension: Secondary | ICD-10-CM | POA: Diagnosis not present

## 2022-05-05 DIAGNOSIS — I700 Atherosclerosis of aorta: Secondary | ICD-10-CM | POA: Diagnosis not present

## 2022-05-05 DIAGNOSIS — M1990 Unspecified osteoarthritis, unspecified site: Secondary | ICD-10-CM | POA: Diagnosis not present

## 2022-05-06 DIAGNOSIS — C52 Malignant neoplasm of vagina: Secondary | ICD-10-CM | POA: Diagnosis not present

## 2022-05-06 DIAGNOSIS — I700 Atherosclerosis of aorta: Secondary | ICD-10-CM | POA: Diagnosis not present

## 2022-05-06 DIAGNOSIS — F0150 Vascular dementia without behavioral disturbance: Secondary | ICD-10-CM | POA: Diagnosis not present

## 2022-05-06 DIAGNOSIS — I70209 Unspecified atherosclerosis of native arteries of extremities, unspecified extremity: Secondary | ICD-10-CM | POA: Diagnosis not present

## 2022-05-06 DIAGNOSIS — M1990 Unspecified osteoarthritis, unspecified site: Secondary | ICD-10-CM | POA: Diagnosis not present

## 2022-05-06 DIAGNOSIS — I10 Essential (primary) hypertension: Secondary | ICD-10-CM | POA: Diagnosis not present

## 2022-05-07 DIAGNOSIS — I700 Atherosclerosis of aorta: Secondary | ICD-10-CM | POA: Diagnosis not present

## 2022-05-07 DIAGNOSIS — F0150 Vascular dementia without behavioral disturbance: Secondary | ICD-10-CM | POA: Diagnosis not present

## 2022-05-07 DIAGNOSIS — M1990 Unspecified osteoarthritis, unspecified site: Secondary | ICD-10-CM | POA: Diagnosis not present

## 2022-05-07 DIAGNOSIS — I10 Essential (primary) hypertension: Secondary | ICD-10-CM | POA: Diagnosis not present

## 2022-05-07 DIAGNOSIS — C52 Malignant neoplasm of vagina: Secondary | ICD-10-CM | POA: Diagnosis not present

## 2022-05-07 DIAGNOSIS — I70209 Unspecified atherosclerosis of native arteries of extremities, unspecified extremity: Secondary | ICD-10-CM | POA: Diagnosis not present

## 2022-05-08 DIAGNOSIS — M1990 Unspecified osteoarthritis, unspecified site: Secondary | ICD-10-CM | POA: Diagnosis not present

## 2022-05-08 DIAGNOSIS — F0150 Vascular dementia without behavioral disturbance: Secondary | ICD-10-CM | POA: Diagnosis not present

## 2022-05-08 DIAGNOSIS — I10 Essential (primary) hypertension: Secondary | ICD-10-CM | POA: Diagnosis not present

## 2022-05-08 DIAGNOSIS — I700 Atherosclerosis of aorta: Secondary | ICD-10-CM | POA: Diagnosis not present

## 2022-05-08 DIAGNOSIS — C52 Malignant neoplasm of vagina: Secondary | ICD-10-CM | POA: Diagnosis not present

## 2022-05-08 DIAGNOSIS — I70209 Unspecified atherosclerosis of native arteries of extremities, unspecified extremity: Secondary | ICD-10-CM | POA: Diagnosis not present

## 2022-05-09 DIAGNOSIS — F0150 Vascular dementia without behavioral disturbance: Secondary | ICD-10-CM | POA: Diagnosis not present

## 2022-05-09 DIAGNOSIS — I10 Essential (primary) hypertension: Secondary | ICD-10-CM | POA: Diagnosis not present

## 2022-05-09 DIAGNOSIS — M1990 Unspecified osteoarthritis, unspecified site: Secondary | ICD-10-CM | POA: Diagnosis not present

## 2022-05-09 DIAGNOSIS — I70209 Unspecified atherosclerosis of native arteries of extremities, unspecified extremity: Secondary | ICD-10-CM | POA: Diagnosis not present

## 2022-05-09 DIAGNOSIS — I700 Atherosclerosis of aorta: Secondary | ICD-10-CM | POA: Diagnosis not present

## 2022-05-09 DIAGNOSIS — C52 Malignant neoplasm of vagina: Secondary | ICD-10-CM | POA: Diagnosis not present

## 2022-05-10 DIAGNOSIS — C52 Malignant neoplasm of vagina: Secondary | ICD-10-CM | POA: Diagnosis not present

## 2022-05-10 DIAGNOSIS — I70209 Unspecified atherosclerosis of native arteries of extremities, unspecified extremity: Secondary | ICD-10-CM | POA: Diagnosis not present

## 2022-05-10 DIAGNOSIS — I10 Essential (primary) hypertension: Secondary | ICD-10-CM | POA: Diagnosis not present

## 2022-05-10 DIAGNOSIS — M1990 Unspecified osteoarthritis, unspecified site: Secondary | ICD-10-CM | POA: Diagnosis not present

## 2022-05-10 DIAGNOSIS — I700 Atherosclerosis of aorta: Secondary | ICD-10-CM | POA: Diagnosis not present

## 2022-05-10 DIAGNOSIS — F0150 Vascular dementia without behavioral disturbance: Secondary | ICD-10-CM | POA: Diagnosis not present

## 2022-05-11 DIAGNOSIS — F0150 Vascular dementia without behavioral disturbance: Secondary | ICD-10-CM | POA: Diagnosis not present

## 2022-05-11 DIAGNOSIS — I70209 Unspecified atherosclerosis of native arteries of extremities, unspecified extremity: Secondary | ICD-10-CM | POA: Diagnosis not present

## 2022-05-11 DIAGNOSIS — M1990 Unspecified osteoarthritis, unspecified site: Secondary | ICD-10-CM | POA: Diagnosis not present

## 2022-05-11 DIAGNOSIS — C52 Malignant neoplasm of vagina: Secondary | ICD-10-CM | POA: Diagnosis not present

## 2022-05-11 DIAGNOSIS — I700 Atherosclerosis of aorta: Secondary | ICD-10-CM | POA: Diagnosis not present

## 2022-05-11 DIAGNOSIS — I10 Essential (primary) hypertension: Secondary | ICD-10-CM | POA: Diagnosis not present

## 2022-05-12 DIAGNOSIS — I70209 Unspecified atherosclerosis of native arteries of extremities, unspecified extremity: Secondary | ICD-10-CM | POA: Diagnosis not present

## 2022-05-12 DIAGNOSIS — M1990 Unspecified osteoarthritis, unspecified site: Secondary | ICD-10-CM | POA: Diagnosis not present

## 2022-05-12 DIAGNOSIS — I700 Atherosclerosis of aorta: Secondary | ICD-10-CM | POA: Diagnosis not present

## 2022-05-12 DIAGNOSIS — F0150 Vascular dementia without behavioral disturbance: Secondary | ICD-10-CM | POA: Diagnosis not present

## 2022-05-12 DIAGNOSIS — C52 Malignant neoplasm of vagina: Secondary | ICD-10-CM | POA: Diagnosis not present

## 2022-05-12 DIAGNOSIS — I10 Essential (primary) hypertension: Secondary | ICD-10-CM | POA: Diagnosis not present

## 2022-05-13 DIAGNOSIS — M1990 Unspecified osteoarthritis, unspecified site: Secondary | ICD-10-CM | POA: Diagnosis not present

## 2022-05-13 DIAGNOSIS — C52 Malignant neoplasm of vagina: Secondary | ICD-10-CM | POA: Diagnosis not present

## 2022-05-13 DIAGNOSIS — I70209 Unspecified atherosclerosis of native arteries of extremities, unspecified extremity: Secondary | ICD-10-CM | POA: Diagnosis not present

## 2022-05-13 DIAGNOSIS — I700 Atherosclerosis of aorta: Secondary | ICD-10-CM | POA: Diagnosis not present

## 2022-05-13 DIAGNOSIS — I10 Essential (primary) hypertension: Secondary | ICD-10-CM | POA: Diagnosis not present

## 2022-05-13 DIAGNOSIS — F0150 Vascular dementia without behavioral disturbance: Secondary | ICD-10-CM | POA: Diagnosis not present

## 2022-05-14 DIAGNOSIS — F0150 Vascular dementia without behavioral disturbance: Secondary | ICD-10-CM | POA: Diagnosis not present

## 2022-05-14 DIAGNOSIS — C52 Malignant neoplasm of vagina: Secondary | ICD-10-CM | POA: Diagnosis not present

## 2022-05-14 DIAGNOSIS — I10 Essential (primary) hypertension: Secondary | ICD-10-CM | POA: Diagnosis not present

## 2022-05-14 DIAGNOSIS — M1990 Unspecified osteoarthritis, unspecified site: Secondary | ICD-10-CM | POA: Diagnosis not present

## 2022-05-14 DIAGNOSIS — I700 Atherosclerosis of aorta: Secondary | ICD-10-CM | POA: Diagnosis not present

## 2022-05-14 DIAGNOSIS — I70209 Unspecified atherosclerosis of native arteries of extremities, unspecified extremity: Secondary | ICD-10-CM | POA: Diagnosis not present

## 2022-05-15 DIAGNOSIS — M1990 Unspecified osteoarthritis, unspecified site: Secondary | ICD-10-CM | POA: Diagnosis not present

## 2022-05-15 DIAGNOSIS — I10 Essential (primary) hypertension: Secondary | ICD-10-CM | POA: Diagnosis not present

## 2022-05-15 DIAGNOSIS — C52 Malignant neoplasm of vagina: Secondary | ICD-10-CM | POA: Diagnosis not present

## 2022-05-15 DIAGNOSIS — F0150 Vascular dementia without behavioral disturbance: Secondary | ICD-10-CM | POA: Diagnosis not present

## 2022-05-15 DIAGNOSIS — I700 Atherosclerosis of aorta: Secondary | ICD-10-CM | POA: Diagnosis not present

## 2022-05-15 DIAGNOSIS — I70209 Unspecified atherosclerosis of native arteries of extremities, unspecified extremity: Secondary | ICD-10-CM | POA: Diagnosis not present

## 2022-05-29 DEATH — deceased

## 2023-03-13 NOTE — Telephone Encounter (Signed)
TC
